# Patient Record
Sex: Female | Born: 1935 | Race: White | Hispanic: No | Marital: Married | Smoking: Never smoker
Health system: Southern US, Community
[De-identification: ages and names within clinical notes are randomized; demographics above are authoritative.]

## PROBLEM LIST (undated history)

## (undated) DIAGNOSIS — N390 Urinary tract infection, site not specified: Secondary | ICD-10-CM

## (undated) DIAGNOSIS — F32A Depression, unspecified: Secondary | ICD-10-CM

## (undated) DIAGNOSIS — F419 Anxiety disorder, unspecified: Secondary | ICD-10-CM

## (undated) DIAGNOSIS — E785 Hyperlipidemia, unspecified: Secondary | ICD-10-CM

## (undated) DIAGNOSIS — F329 Major depressive disorder, single episode, unspecified: Secondary | ICD-10-CM

## (undated) DIAGNOSIS — I10 Essential (primary) hypertension: Secondary | ICD-10-CM

## (undated) DIAGNOSIS — N189 Chronic kidney disease, unspecified: Secondary | ICD-10-CM

## (undated) DIAGNOSIS — T7840XA Allergy, unspecified, initial encounter: Secondary | ICD-10-CM

## (undated) DIAGNOSIS — M1990 Unspecified osteoarthritis, unspecified site: Secondary | ICD-10-CM

## (undated) HISTORY — DX: Depression, unspecified: F32.A

## (undated) HISTORY — DX: Hyperlipidemia, unspecified: E78.5

## (undated) HISTORY — DX: Essential (primary) hypertension: I10

## (undated) HISTORY — DX: Anxiety disorder, unspecified: F41.9

## (undated) HISTORY — DX: Major depressive disorder, single episode, unspecified: F32.9

## (undated) HISTORY — DX: Urinary tract infection, site not specified: N39.0

## (undated) HISTORY — DX: Allergy, unspecified, initial encounter: T78.40XA

## (undated) HISTORY — PX: INCONTINENCE SURGERY: SHX676

## (undated) HISTORY — PX: ABDOMINAL HYSTERECTOMY: SHX81

## (undated) HISTORY — PX: OTHER SURGICAL HISTORY: SHX169

---

## 2001-02-27 ENCOUNTER — Other Ambulatory Visit: Admission: RE | Admit: 2001-02-27 | Discharge: 2001-02-27 | Payer: Self-pay | Admitting: Obstetrics and Gynecology

## 2007-01-19 ENCOUNTER — Emergency Department (HOSPITAL_COMMUNITY): Admission: EM | Admit: 2007-01-19 | Discharge: 2007-01-19 | Payer: Self-pay | Admitting: Emergency Medicine

## 2007-01-23 ENCOUNTER — Emergency Department (HOSPITAL_COMMUNITY): Admission: EM | Admit: 2007-01-23 | Discharge: 2007-01-23 | Payer: Self-pay | Admitting: Emergency Medicine

## 2007-04-09 ENCOUNTER — Encounter: Admission: RE | Admit: 2007-04-09 | Discharge: 2007-04-09 | Payer: Self-pay | Admitting: Orthopedic Surgery

## 2007-05-15 ENCOUNTER — Other Ambulatory Visit: Admission: RE | Admit: 2007-05-15 | Discharge: 2007-05-15 | Payer: Self-pay | Admitting: Otolaryngology

## 2007-12-04 ENCOUNTER — Encounter: Payer: Self-pay | Admitting: Cardiovascular Disease

## 2009-06-04 ENCOUNTER — Inpatient Hospital Stay (HOSPITAL_COMMUNITY)
Admission: EM | Admit: 2009-06-04 | Discharge: 2009-06-05 | Payer: Self-pay | Source: Home / Self Care | Admitting: Emergency Medicine

## 2009-06-04 ENCOUNTER — Ambulatory Visit: Payer: Self-pay | Admitting: Family Medicine

## 2009-06-17 ENCOUNTER — Ambulatory Visit: Payer: Self-pay | Admitting: Family Medicine

## 2009-06-17 DIAGNOSIS — F4310 Post-traumatic stress disorder, unspecified: Secondary | ICD-10-CM | POA: Insufficient documentation

## 2009-06-17 DIAGNOSIS — M810 Age-related osteoporosis without current pathological fracture: Secondary | ICD-10-CM | POA: Insufficient documentation

## 2009-06-17 DIAGNOSIS — E785 Hyperlipidemia, unspecified: Secondary | ICD-10-CM | POA: Insufficient documentation

## 2009-06-17 DIAGNOSIS — M159 Polyosteoarthritis, unspecified: Secondary | ICD-10-CM | POA: Insufficient documentation

## 2009-06-17 DIAGNOSIS — I10 Essential (primary) hypertension: Secondary | ICD-10-CM | POA: Insufficient documentation

## 2009-06-18 ENCOUNTER — Telehealth: Payer: Self-pay | Admitting: Family Medicine

## 2009-10-13 ENCOUNTER — Encounter: Payer: Self-pay | Admitting: Family Medicine

## 2010-01-21 ENCOUNTER — Ambulatory Visit: Payer: Self-pay | Admitting: Family Medicine

## 2010-01-21 ENCOUNTER — Encounter: Payer: Self-pay | Admitting: Family Medicine

## 2010-01-21 LAB — CONVERTED CEMR LAB
BUN: 28 mg/dL — ABNORMAL HIGH (ref 6–23)
CO2: 26 meq/L (ref 19–32)
Calcium: 9.4 mg/dL (ref 8.4–10.5)
Chloride: 101 meq/L (ref 96–112)
Creatinine, Ser: 1.22 mg/dL — ABNORMAL HIGH (ref 0.40–1.20)
Glucose, Bld: 108 mg/dL — ABNORMAL HIGH (ref 70–99)
HCT: 42.4 % (ref 36.0–46.0)
Hemoglobin: 14.3 g/dL
Hemoglobin: 14.1 g/dL (ref 12.0–15.0)
MCHC: 33.3 g/dL (ref 30.0–36.0)
MCV: 91.6 fL (ref 78.0–100.0)
Platelets: 207 10*3/uL (ref 150–400)
Potassium: 3.6 meq/L (ref 3.5–5.3)
RBC: 4.63 M/uL (ref 3.87–5.11)
RDW: 12.9 % (ref 11.5–15.5)
Sodium: 138 meq/L (ref 135–145)
TSH: 2.068 microintl units/mL (ref 0.350–4.500)
WBC: 7 10*3/uL (ref 4.0–10.5)

## 2010-01-29 ENCOUNTER — Ambulatory Visit: Payer: Self-pay | Admitting: Family Medicine

## 2010-04-11 ENCOUNTER — Telehealth: Payer: Self-pay | Admitting: Family Medicine

## 2010-04-13 ENCOUNTER — Ambulatory Visit: Payer: Self-pay | Admitting: Cardiovascular Disease

## 2010-05-16 ENCOUNTER — Emergency Department (HOSPITAL_COMMUNITY)
Admission: EM | Admit: 2010-05-16 | Discharge: 2010-05-16 | Payer: Self-pay | Source: Home / Self Care | Admitting: Emergency Medicine

## 2010-05-17 LAB — CBC
HCT: 39.7 % (ref 36.0–46.0)
Hemoglobin: 13 g/dL (ref 12.0–15.0)
MCH: 29.5 pg (ref 26.0–34.0)
MCHC: 32.7 g/dL (ref 30.0–36.0)
MCV: 90 fL (ref 78.0–100.0)
RBC: 4.41 MIL/uL (ref 3.87–5.11)
RDW: 12.8 % (ref 11.5–15.5)
WBC: 7.7 10*3/uL (ref 4.0–10.5)

## 2010-05-17 LAB — URINALYSIS, ROUTINE W REFLEX MICROSCOPIC
Bilirubin Urine: NEGATIVE
Hgb urine dipstick: NEGATIVE
Ketones, ur: NEGATIVE mg/dL
Nitrite: NEGATIVE
Protein, ur: NEGATIVE mg/dL
Specific Gravity, Urine: 1.018 (ref 1.005–1.030)
Urine Glucose, Fasting: NEGATIVE mg/dL
Urobilinogen, UA: 1 mg/dL (ref 0.0–1.0)
pH: 6 (ref 5.0–8.0)

## 2010-05-17 LAB — BASIC METABOLIC PANEL
BUN: 31 mg/dL — ABNORMAL HIGH (ref 6–23)
CO2: 22 mEq/L (ref 19–32)
Chloride: 106 mEq/L (ref 96–112)
GFR calc Af Amer: 45 mL/min — ABNORMAL LOW (ref >60)
Glucose, Bld: 125 mg/dL — ABNORMAL HIGH (ref 70–99)
Potassium: 3.4 mEq/L — ABNORMAL LOW (ref 3.5–5.1)
Sodium: 138 mEq/L (ref 135–145)

## 2010-05-17 LAB — URINE MICROSCOPIC-ADD ON

## 2010-05-19 LAB — URINE CULTURE
Colony Count: 50000
Culture  Setup Time: 201201152108

## 2010-05-27 ENCOUNTER — Ambulatory Visit
Admission: RE | Admit: 2010-05-27 | Discharge: 2010-05-27 | Payer: Self-pay | Source: Home / Self Care | Attending: Family Medicine | Admitting: Family Medicine

## 2010-05-27 ENCOUNTER — Encounter: Payer: Self-pay | Admitting: Family Medicine

## 2010-05-27 DIAGNOSIS — R300 Dysuria: Secondary | ICD-10-CM | POA: Insufficient documentation

## 2010-05-27 DIAGNOSIS — R5383 Other fatigue: Secondary | ICD-10-CM | POA: Insufficient documentation

## 2010-05-27 DIAGNOSIS — N819 Female genital prolapse, unspecified: Secondary | ICD-10-CM | POA: Insufficient documentation

## 2010-05-27 DIAGNOSIS — R5381 Other malaise: Secondary | ICD-10-CM | POA: Insufficient documentation

## 2010-05-27 LAB — CONVERTED CEMR LAB
Bilirubin Urine: NEGATIVE
Blood in Urine, dipstick: NEGATIVE
Glucose, Urine, Semiquant: NEGATIVE
Nitrite: NEGATIVE
Protein, U semiquant: NEGATIVE
Specific Gravity, Urine: 1.015
Urobilinogen, UA: 0.2
WBC Urine, dipstick: NEGATIVE
pH: 6

## 2010-05-28 LAB — CONVERTED CEMR LAB
ALT: 18 units/L (ref 0–35)
AST: 24 units/L (ref 0–37)
Albumin: 4.3 g/dL (ref 3.5–5.2)
Alkaline Phosphatase: 82 units/L (ref 39–117)
BUN: 28 mg/dL — ABNORMAL HIGH (ref 6–23)
Basophils Absolute: 0 10*3/uL (ref 0.0–0.1)
Basophils Relative: 0 % (ref 0–1)
CO2: 29 meq/L (ref 19–32)
Calcium: 9.9 mg/dL (ref 8.4–10.5)
Chloride: 104 meq/L (ref 96–112)
Creatinine, Ser: 1.43 mg/dL — ABNORMAL HIGH (ref 0.40–1.20)
Eosinophils Absolute: 0.1 10*3/uL (ref 0.0–0.7)
Eosinophils Relative: 2 % (ref 0–5)
Glucose, Bld: 104 mg/dL — ABNORMAL HIGH (ref 70–99)
HCT: 41.3 % (ref 36.0–46.0)
Hemoglobin: 13.4 g/dL (ref 12.0–15.0)
Lymphocytes Relative: 26 % (ref 12–46)
Lymphs Abs: 1.7 10*3/uL (ref 0.7–4.0)
MCHC: 32.4 g/dL (ref 30.0–36.0)
MCV: 92.2 fL (ref 78.0–100.0)
Monocytes Absolute: 0.6 10*3/uL (ref 0.1–1.0)
Monocytes Relative: 9 % (ref 3–12)
Neutro Abs: 4 10*3/uL (ref 1.7–7.7)
Neutrophils Relative %: 63 % (ref 43–77)
Platelets: 192 10*3/uL (ref 150–400)
Potassium: 3.6 meq/L (ref 3.5–5.3)
RBC: 4.48 M/uL (ref 3.87–5.11)
RDW: 13.4 % (ref 11.5–15.5)
Sodium: 141 meq/L (ref 135–145)
Total Bilirubin: 0.8 mg/dL (ref 0.3–1.2)
Total Protein: 6.9 g/dL (ref 6.0–8.3)
WBC: 6.4 10*3/uL (ref 4.0–10.5)

## 2010-06-01 NOTE — Assessment & Plan Note (Signed)
Summary: f/u eo   Vital Signs:  Patient profile:   77 year old female Weight:      160 pounds Temp:     98.4 degrees F oral Pulse rate:   55 / minute BP sitting:   145 / 73  (right arm) Cuff size:   regular  Vitals Entered By: Tessie Fass CMA (January 29, 2010 1:44 PM) CC: f/u black stools Is Patient Diabetic? No Pain Assessment Patient in pain? no        Primary Care Provider:  Helane Rima DO  CC:  f/u black stools.  History of Present Illness: 77 yo F:  1. Follow-Up Black Stools: See previous OV for more details. Patient states that she has had no more episodes of black stools since her last visit. BM now daily, normal. Patient denies abdominal pain at the last visit, but states that she feels much better since starting the Protonix (prescribed initally for suspected gastric irritation with possible GI bleed) - "I can actually sleep now." No CP, SOB, N/V/D/C, LE edema, rash, abdominal pain.  Habits & Providers  Alcohol-Tobacco-Diet     Tobacco Status: never  Current Medications (verified): 1)  Zoloft 100 Mg Tabs (Sertraline Hcl) .... 1/2 By Mouth Daily 2)  Metoprolol Tartrate 50 Mg Tabs (Metoprolol Tartrate) .... Take 1 Tab By Mouth Daily 3)  Potassium Chloride Cr 10 Meq  Tbcr (Potassium Chloride) .... 2 By Mouth Daily 4)  Hydrochlorothiazide 12.5 Mg  Tabs (Hydrochlorothiazide) .... Take 1 Tab  By Mouth Every Morning 5)  Epipen 2-Pak 0.3 Mg/0.65ml Devi (Epinephrine) .... Use As Directed 6)  Simvastatin 80 Mg Tabs (Simvastatin) .Marland Kitchen.. 1 By Mouth At Bedtime 7)  Protonix 40 Mg Tbec (Pantoprazole Sodium) .... One By Mouth Daily  Allergies (verified): 1)  ! * Lisinopril 2)  ! * Carvedilol 3)  ! Doxazosin PMH-FH-SH reviewed for relevance  Review of Systems      See HPI  Physical Exam  General:  Elderly female, in no acute distress; alert, appropriate and cooperative throughout examination. Vitals reviewed. Abdomen:  Bowel sounds positive, abdomen soft and  non-tender without masses, organomegaly or hernias noted.   Impression & Recommendations:  Problem # 1:  FECES, ABNORMAL (ICD-787.7) Assessment Improved Likely 2/2 Pepto Bismol use. Red Flags given for seeking medical care. Hgb WNL. Orders: [facility]- Est Level  3 ([id number])  Problem # 2:  DYSPEPSIA (ICD-536.8) Assessment: Improved Patient was vague at last visit re: s/s. Now that symptoms improved with Protonix, will continue until our next visit. Bahavior modifications discussed. Orders: [facility]- Est Level  3 ([id number])  Complete Medication List: 1)  Zoloft 100 Mg Tabs (Sertraline hcl) .... 1/2 by mouth daily 2)  Metoprolol Tartrate 50 Mg Tabs (Metoprolol tartrate) .... Take 1 tab by mouth daily 3)  Potassium Chloride Cr 10 Meq Tbcr (Potassium chloride) .... 2 by mouth daily 4)  Hydrochlorothiazide 12.5 Mg Tabs (Hydrochlorothiazide) .... Take 1 tab  by mouth every morning 5)  Epipen 2-pak 0.3 Mg/0.31ml Devi (Epinephrine) .... Use as directed 6)  Simvastatin 80 Mg Tabs (Simvastatin) .Marland Kitchen.. 1 by mouth at bedtime 7)  Protonix 40 Mg Tbec (Pantoprazole sodium) .... One by mouth daily  Patient Instructions: 1)  It was nice to see you today! 2)  I'm glad that you are feeling better! 3)  Follow up in (at the most) 1-2 months so that we can discuss your chronic medical conditions. Prescriptions: ZOLOFT 100 MG TABS (SERTRALINE HCL) 1/2 by mouth daily  #30 x  3   Entered and Authorized by:   Helane Rima DO   Signed by:   Helane Rima DO on 01/29/2010   Method used:   Print then Give to Patient   RxID:   [id number] PROTONIX 40 MG TBEC (PANTOPRAZOLE SODIUM) one by mouth daily  #30 x 3   Entered and Authorized by:   Helane Rima DO   Signed by:   Helane Rima DO on 01/29/2010   Method used:   Print then Give to Patient   RxID:   [id number]   Prevention & Chronic Care Immunizations   Influenza vaccine: Not documented   Influenza vaccine deferral: Not indicated  (06/17/2009)   Influenza  vaccine due: 01/01/2011    Tetanus booster: Not documented    Pneumococcal vaccine: Not documented   Pneumococcal vaccine deferral: Not indicated  (06/17/2009)    H. zoster vaccine: Not documented  Colorectal Screening   Hemoccult: Not documented   Hemoccult action/deferral: Not indicated  (01/21/2010)    Colonoscopy: Not documented   Colonoscopy action/deferral: Refused  (01/29/2010)  Other Screening   Pap smear: Not documented   Pap smear action/deferral: Not indicated-other  (01/21/2010)    Mammogram: Not documented    DXA bone density scan: Not documented   DXA bone density action/deferral: Refused  (01/21/2010)   Smoking status: never  (01/29/2010)  Lipids   Total Cholesterol: Not documented   LDL: Not documented   LDL Direct: Not documented   HDL: Not documented   Triglycerides: Not documented    SGOT (AST): Not documented   SGPT (ALT): Not documented   Alkaline phosphatase: Not documented   Total bilirubin: Not documented    Lipid flowsheet reviewed?: Yes   Progress toward LDL goal: Unchanged  Hypertension   Last Blood Pressure: 145 / 73  (01/29/2010)   Serum creatinine: 1.22  (01/21/2010)   Serum potassium 3.6  (01/21/2010)    Hypertension flowsheet reviewed?: Yes   Progress toward BP goal: At goal  Self-Management Support :   Personal Goals (by the next clinic visit) :      Personal blood pressure goal: 140/90  (06/17/2009)     Personal LDL goal: 130  (06/17/2009)    Patient will work on the following items until the next clinic visit to reach self-care goals:     Medications and monitoring: take my medicines every day, bring all of my medications to every visit  (01/29/2010)     Eating: drink diet soda or water instead of juice or soda, eat more vegetables, use fresh or frozen vegetables, eat foods that are low in salt, eat baked foods instead of fried foods, eat fruit for snacks and desserts, limit or avoid alcohol  (01/29/2010)     Activity:  take a 30 minute walk every day, take the stairs instead of the elevator, park at the far end of the parking lot  (01/29/2010)    Hypertension self-management support: Written self-care plan  (01/29/2010)   Hypertension self-care plan printed.    Lipid self-management support: Written self-care plan  (01/29/2010)   Lipid self-care plan printed.

## 2010-06-01 NOTE — Assessment & Plan Note (Signed)
Summary: black stool,tcb   Vital Signs:  Patient profile:   77 year old female Weight:      161 pounds Temp:     97.9 degrees F oral Pulse rate:   56 / minute BP sitting:   162 / 89  (right arm) Cuff size:   regular  Vitals Entered By: Jimmy Footman, CMA (January 21, 2010 10:25 AM) CC: black stools x6 days, vomiting, fever, diarrhea Is Patient Diabetic? No Pain Assessment Patient in pain? yes     Location: stomach Intensity: 10 Type: sharp   Primary Care Provider:  Helane Rima DO  CC:  black stools x6 days, vomiting, fever, and diarrhea.  History of Present Illness: 77 yo F:  1. Black Stools: x 6 days, was loose but took Immodium and is now hard, was associated with abdominal pain - "I felt like I was in labor the first day," + nausea, + NBNB vomiting x 1. None after D#1. Decreased appetitie, but has been eating chicken noodle soup. Denies CP, SOB, HA, dizziness, LE edema, rash, sick contacts, change in diet, change in weight, night sweats, adbominal pain, and diarrhea now.   25 minutes spent with patient to discuss HPI as she was vague with her symptoms and tended to change subjects.  Habits & Providers  Alcohol-Tobacco-Diet     Tobacco Status: never  Current Medications (verified): 1)  Zoloft 100 Mg Tabs (Sertraline Hcl) .... 1/2 By Mouth Daily 2)  Metoprolol Tartrate 50 Mg Tabs (Metoprolol Tartrate) .... Take 1 Tab By Mouth Daily 3)  Potassium Chloride Cr 10 Meq  Tbcr (Potassium Chloride) .... 2 By Mouth Daily 4)  Hydrochlorothiazide 12.5 Mg  Tabs (Hydrochlorothiazide) .... Take 1 Tab  By Mouth Every Morning 5)  Epipen 2-Pak 0.3 Mg/0.42ml Devi (Epinephrine) .... Use As Directed 6)  Simvastatin 80 Mg Tabs (Simvastatin) .Marland Kitchen.. 1 By Mouth At Bedtime 7)  Protonix 40 Mg Tbec (Pantoprazole Sodium) .... One By Mouth Daily  Allergies (verified): 1)  ! * Lisinopril 2)  ! * Carvedilol 3)  ! Doxazosin PMH-FH-SH reviewed for relevance  Review of Systems      See  HPI  Physical Exam  General:  Elderly female, in no acute distress; alert, appropriate and cooperative throughout examination. Vitals reviewed. Lungs:  Normal respiratory effort, chest expands symmetrically. Lungs are clear to auscultation, no crackles or wheezes. Heart:  Normal rate and regular rhythm. S1 and S2 normal without gallop, murmur, click, rub or other extra sounds. Abdomen:  Bowel sounds positive, abdomen soft and non-tender without masses, organomegaly or hernias noted. Rectal:  No external abnormalities noted. Normal sphincter tone. No rectal masses or tenderness. Negative FOB. Pulses:  R and L dorsalis pedis and posterior tibial pulses are full and equal bilaterally. Skin:  Intact without suspicious lesions or rashes.   Impression & Recommendations:  Problem # 1:  FECES, ABNORMAL (ICD-787.7) Assessment New  Suspicious for melena, however negative FOB. Resolved now. Upon further questioning, patient endorses taking Pepto-Bismol as needed for diarrhea/abdominal discomfort a few days ago. Explained that this may be the cause of her black stools. Red Flags given. Will check CBC for anemia. Rx Protonix. If continues, will send to GI for EGD.  Orders: [facility]- Est  Level 4 ([id number])  Complete Medication List: 1)  Zoloft 100 Mg Tabs (Sertraline hcl) .... 1/2 by mouth daily 2)  Metoprolol Tartrate 50 Mg Tabs (Metoprolol tartrate) .... Take 1 tab by mouth daily 3)  Potassium Chloride Cr 10 Meq Tbcr (Potassium  chloride) .... 2 by mouth daily 4)  Hydrochlorothiazide 12.5 Mg Tabs (Hydrochlorothiazide) .... Take 1 tab  by mouth every morning 5)  Epipen 2-pak 0.3 Mg/0.86ml Devi (Epinephrine) .... Use as directed 6)  Simvastatin 80 Mg Tabs (Simvastatin) .Marland Kitchen.. 1 by mouth at bedtime 7)  Protonix 40 Mg Tbec (Pantoprazole sodium) .... One by mouth daily  Other Orders: Basic Met-[facility] [phone]) TSH-[facility] [phone]) Hemoglobin-[facility] [phone]) CBC-[facility] ([id number])  Patient Instructions: 1)  It was  nice to see you today! 2)  Follow up in 1 week. We need to discuss your other medical issues. 3)  You are due for a mammogram. 4)  We are checking labs. Prescriptions: PROTONIX 40 MG TBEC (PANTOPRAZOLE SODIUM) one by mouth daily  #30 x 3   Entered and Authorized by:   Helane Rima DO   Signed by:   Helane Rima DO on 01/21/2010   Method used:   Print then Give to Patient   RxID:   [id number]   Laboratory Results   Blood Tests   Date/Time Received: January 21, 2010 11:11 AM  Date/Time Reported: January 21, 2010 11:21 AM     CBC   HGB:  14.3 g/dL   (Normal Range: 44.0-10.2 in Males, 12.0-15.0 in Females) Comments: venous sample ...............test performed by......Marland KitchenBonnie A. [place], MLS (ASCP)cm     Prevention & Chronic Care Immunizations   Influenza vaccine: Not documented   Influenza vaccine deferral: Not indicated  (06/17/2009)   Influenza vaccine due: 01/01/2011    Tetanus booster: Not documented    Pneumococcal vaccine: Not documented   Pneumococcal vaccine deferral: Not indicated  (06/17/2009)    H. zoster vaccine: Not documented  Colorectal Screening   Hemoccult: Not documented   Hemoccult action/deferral: Not indicated  (01/21/2010)    Colonoscopy: Not documented  Other Screening   Pap smear: Not documented   Pap smear action/deferral: Not indicated-other  (01/21/2010)    Mammogram: Not documented    DXA bone density scan: Not documented   DXA bone density action/deferral: Refused  (01/21/2010)   Smoking status: never  (01/21/2010)  Lipids   Total Cholesterol: Not documented   LDL: Not documented   LDL Direct: Not documented   HDL: Not documented   Triglycerides: Not documented    SGOT (AST): Not documented   SGPT (ALT): Not documented   Alkaline phosphatase: Not documented   Total bilirubin: Not documented    Lipid flowsheet reviewed?: Yes   Progress toward LDL goal: Unchanged  Hypertension   Last Blood Pressure: 162 /  89  (01/21/2010)   Serum creatinine: Not documented   Serum potassium Not documented    Hypertension flowsheet reviewed?: Yes   Progress toward BP goal: Unchanged  Self-Management Support :   Personal Goals (by the next clinic visit) :      Personal blood pressure goal: 140/90  (06/17/2009)     Personal LDL goal: 130  (06/17/2009)    Patient will work on the following items until the next clinic visit to reach self-care goals:     Medications and monitoring: take my medicines every day, bring all of my medications to every visit  (01/21/2010)     Eating: drink diet soda or water instead of juice or soda, eat more vegetables, use fresh or frozen vegetables, eat foods that are low in salt, eat baked foods instead of fried foods, eat fruit for snacks and desserts, limit or avoid alcohol  (01/21/2010)     Activity: take a 30 minute walk every  day  (01/21/2010)    Hypertension self-management support: Written self-care plan  (01/21/2010)   Hypertension self-care plan printed.    Lipid self-management support: Written self-care plan  (01/21/2010)   Lipid self-care plan printed.

## 2010-06-01 NOTE — Assessment & Plan Note (Signed)
Summary: h/fup,tcb   Vital Signs:  Patient profile:   77 year old female Weight:      162 pounds Temp:     98.0 degrees F oral Pulse rate:   93 / minute BP sitting:   148 / 82  (left arm) Cuff size:   regular  Vitals Entered By: Tessie Fass CMA, (June 17, 2009 3:35 PM) CC: Hospital Follow-up/New Patient Is Patient Diabetic? No Pain Assessment Patient in pain? no        Primary Care Provider:  Helane Rima DO  CC:  Hospital Follow-up/New Patient.  History of Present Illness: 77 yo F with recent hopsital admission for anaphylaxis, thought to be 2/2 to ACE and worsened by Coreg.  1. HTN: Now on Rx Metoprolol, HCTZ, KDur. Controlled. Patient goes to [facility] and has an appointment on the 22nd.   2. HLD: Rx Simvastatin.  3. PTSD: We spent >15 minutes discussing the patient's anxiety/depression associated with her recent experience. She has periods of cyring uncontrollably and feeling angry toward her husband since her hospitalization. She states that he should have put a blanket on her when she was naked in the Emergency Room. Her adult children have been staying with them since coming home. They have been very helpful around the house and she has been able to talk to them as well as her husband about her feelings. She requests Alprazolam today, stating that she has been prescribed this before and it has helped.    Habits & Providers  Alcohol-Tobacco-Diet     Tobacco Status: never  Current Medications (verified): 1)  Zoloft 100 Mg Tabs (Sertraline Hcl) .... 1/2 By Mouth Daily 2)  Metoprolol Tartrate 50 Mg Tabs (Metoprolol Tartrate) .... Take 1 Tab By Mouth Daily 3)  Potassium Chloride 20 Meq  Pack (Potassium Chloride) .... One By Mouth Daily 4)  Hydrochlorothiazide 12.5 Mg  Tabs (Hydrochlorothiazide) .... Take 1 Tab  By Mouth Every Morning 5)  Epipen 2-Pak 0.3 Mg/0.28ml Devi (Epinephrine) .... Use As Directed 6)  Simvastatin 80 Mg Tabs (Simvastatin) .Marland Kitchen.. 1 By  Mouth At Bedtime  Allergies (verified): 1)  ! * Lisinopril 2)  ! * Carvedilol 3)  ! Doxazosin  Past History:  Past Medical History: OSTEOARTHRITIS, MULTIPLE JOINTS (ICD-715.89) OSTEOPOROSIS (ICD-733.00) PTSD (ICD-309.81) HYPERLIPIDEMIA (ICD-272.4) ESSENTIAL HYPERTENSION (ICD-401.9) ANAPHYLACTIC REACTION (ICD-995.0) PUD, HX OF (ICD-V12.71)  Past Surgical History: Hysterectomy (1968) Bladder Sling + Flu Vaccine (Nov 2010)  Family History: Mother: DM Sister: Breast Cancer, age 39  Social History: Lives in [place] with her husband Yazaira Speas) and step-son. She is retired. Denies tobacco, ETOH, drugs.Smoking Status:  never  Review of Systems       The patient complains of depression.  The patient denies fever, chest pain, syncope, dyspnea on exertion, peripheral edema, prolonged cough, headaches, severe indigestion/heartburn, muscle weakness, and difficulty walking.    Physical Exam  General:  Elderly female, in no acute distress; alert, appropriate and cooperative throughout examination. Vitals reviewed. Neck:  No deformities, masses, or tenderness noted. Lungs:  Normal respiratory effort, chest expands symmetrically. Lungs are clear to auscultation, no crackles or wheezes. Heart:  Normal rate and regular rhythm. S1 and S2 normal without gallop, murmur, click, rub or other extra sounds. Psych:  Oriented X3, memory intact for recent and remote, normally interactive, and good eye contact.     Impression & Recommendations:  Problem # 1:  ANAPHYLACTIC REACTION (ICD-995.0) Assessment New Hx of Anaphylactic Rxn thought to be 2/2 ACE plus Coreg. No  episodes since leaving the hospital. Patient now has Epi-pen and understands how to use it.  Problem # 2:  ESSENTIAL HYPERTENSION (ICD-401.9) Assessment: Unchanged  Her updated medication list for this problem includes:    Metoprolol Tartrate 50 Mg Tabs (Metoprolol tartrate) .Marland Kitchen... Take 1 tab by mouth daily    Hydrochlorothiazide  12.5 Mg Tabs (Hydrochlorothiazide) .Marland Kitchen... Take 1 tab  by mouth every morning  Problem # 3:  HYPERLIPIDEMIA (ICD-272.4) Assessment: Unchanged  Her updated medication list for this problem includes:    Simvastatin 80 Mg Tabs (Simvastatin) .Marland Kitchen... 1 by mouth at bedtime  Problem # 4:  PTSD (ICD-309.81) Assessment: New Rx Zoloft. No SI/HI. Patient has family support and talks about the experience with them. Denies request for Xanax as this medication is too risky for her (sedation, fall, etc.). Patient understands and agreed.   Complete Medication List: 1)  Zoloft 100 Mg Tabs (Sertraline hcl) .... 1/2 by mouth daily 2)  Metoprolol Tartrate 50 Mg Tabs (Metoprolol tartrate) .... Take 1 tab by mouth daily 3)  Potassium Chloride 20 Meq Pack (Potassium chloride) .... One by mouth daily 4)  Hydrochlorothiazide 12.5 Mg Tabs (Hydrochlorothiazide) .... Take 1 tab  by mouth every morning 5)  Epipen 2-pak 0.3 Mg/0.67ml Devi (Epinephrine) .... Use as directed 6)  Simvastatin 80 Mg Tabs (Simvastatin) .Marland Kitchen.. 1 by mouth at bedtime  Patient Instructions: 1)  It was nice to see you today! 2)  Follow up in 1 month. Prescriptions: SIMVASTATIN 80 MG TABS (SIMVASTATIN) 1 by mouth at bedtime  #90 x 3   Entered and Authorized by:   Helane Rima DO   Signed by:   Helane Rima DO on 06/20/2009   Method used:   Historical   RxID:   [id number] HYDROCHLOROTHIAZIDE 12.5 MG  TABS (HYDROCHLOROTHIAZIDE) Take 1 tab  by mouth every morning  #90 x 3   Entered and Authorized by:   Helane Rima DO   Signed by:   Helane Rima DO on 06/17/2009   Method used:   Print then Give to Patient   RxID:   [id number] POTASSIUM CHLORIDE 20 MEQ  PACK (POTASSIUM CHLORIDE) one by mouth daily  #90 x 3   Entered and Authorized by:   Helane Rima DO   Signed by:   Helane Rima DO on 06/17/2009   Method used:   Print then Give to Patient   RxID:   [id number] METOPROLOL TARTRATE 50 MG TABS (METOPROLOL TARTRATE) Take 1 tab  by mouth daily  #90 x 3   Entered and Authorized by:   Helane Rima DO   Signed by:   Helane Rima DO on 06/17/2009   Method used:   Print then Give to Patient   RxID:   [id number] ZOLOFT 100 MG TABS (SERTRALINE HCL) 1/2 by mouth daily  #30 x 0   Entered and Authorized by:   Helane Rima DO   Signed by:   Helane Rima DO on 06/17/2009   Method used:   Print then Give to Patient   RxID:   [id number]   Prevention & Chronic Care Immunizations   Influenza vaccine: Not documented   Influenza vaccine deferral: Not indicated  (06/17/2009)   Influenza vaccine due: 01/01/2011    Tetanus booster: Not documented    Pneumococcal vaccine: Not documented   Pneumococcal vaccine deferral: Not indicated  (06/17/2009)    H. zoster vaccine: Not documented  Colorectal Screening   Hemoccult: Not documented    Colonoscopy: Not documented  Other Screening  Pap smear: Not documented    Mammogram: Not documented    DXA bone density scan: Not documented   Smoking status: never  (06/17/2009)  Lipids   Total Cholesterol: Not documented   LDL: Not documented   LDL Direct: Not documented   HDL: Not documented   Triglycerides: Not documented    SGOT (AST): Not documented   SGPT (ALT): Not documented   Alkaline phosphatase: Not documented   Total bilirubin: Not documented    Lipid flowsheet reviewed?: Yes   Progress toward LDL goal: Unchanged   Lipid comments: Last checked at [facility] (05/2009)  Hypertension   Last Blood Pressure: 148 / 82  (06/17/2009)   Serum creatinine: Not documented   Serum potassium Not documented    Hypertension flowsheet reviewed?: Yes   Progress toward BP goal: Unchanged  Self-Management Support :   Personal Goals (by the next clinic visit) :      Personal blood pressure goal: 140/90  (06/17/2009)     Personal LDL goal: 130  (06/17/2009)    Patient will work on the following items until the next clinic visit to reach self-care  goals:     Medications and monitoring: take my medicines every day  (06/17/2009)     Eating: drink diet soda or water instead of juice or soda, eat more vegetables, use fresh or frozen vegetables, eat foods that are low in salt, eat baked foods instead of fried foods, eat fruit for snacks and desserts  (06/17/2009)     Activity: take a 30 minute walk every day  (06/17/2009)    Hypertension self-management support: Written self-care plan  (06/17/2009)   Hypertension self-care plan printed.    Lipid self-management support: Written self-care plan  (06/17/2009)   Lipid self-care plan printed.

## 2010-06-01 NOTE — Progress Notes (Signed)
Summary: meds prob  Phone Note Call from Patient Call back at Home Phone [phone]   Caller: Patient Summary of Call: pt states that ins won't pay for potassium powder but will pay for pill form [facility] [place] Initial call taken by: De Nurse,  June 18, 2009 2:05 PM    New/Updated Medications: POTASSIUM CHLORIDE CR 10 MEQ  TBCR (POTASSIUM CHLORIDE) 2 by mouth daily Prescriptions: POTASSIUM CHLORIDE CR 10 MEQ  TBCR (POTASSIUM CHLORIDE) 2 by mouth daily  #60 x 3   Entered and Authorized by:   Helane Rima DO   Signed by:   Helane Rima DO on 06/20/2009   Method used:   Electronically to        [facility] [phone]* (retail)       [address]       [place], [place]  [address]       Ph: [phone] or [phone]       Fax: [phone]   RxID:   [id number]

## 2010-06-03 NOTE — Progress Notes (Signed)
Summary: UPDATE PROBLEM LIST   

## 2010-06-04 ENCOUNTER — Encounter: Payer: Self-pay | Admitting: Family Medicine

## 2010-06-04 ENCOUNTER — Ambulatory Visit (INDEPENDENT_AMBULATORY_CARE_PROVIDER_SITE_OTHER): Payer: MEDICARE | Admitting: Family Medicine

## 2010-06-04 DIAGNOSIS — R5383 Other fatigue: Secondary | ICD-10-CM

## 2010-06-04 DIAGNOSIS — N179 Acute kidney failure, unspecified: Secondary | ICD-10-CM

## 2010-06-04 DIAGNOSIS — R5381 Other malaise: Secondary | ICD-10-CM

## 2010-06-04 DIAGNOSIS — N183 Chronic kidney disease, stage 3 unspecified: Secondary | ICD-10-CM | POA: Insufficient documentation

## 2010-06-04 DIAGNOSIS — N819 Female genital prolapse, unspecified: Secondary | ICD-10-CM

## 2010-06-09 NOTE — Consult Note (Signed)
Summary: [facility]  [facility]   Imported By: De Nurse 06/01/2010 15:08:06  _____________________________________________________________________  External Attachment:    Type:   Image     Comment:   External Document

## 2010-06-09 NOTE — Assessment & Plan Note (Signed)
Summary: not feeling well/eo   Vital Signs:  Patient profile:   77 year old female Weight:      162 pounds Temp:     98.1 degrees F oral Pulse rate:   63 / minute Pulse (ortho):   62 / minute BP sitting:   146 / 78  (left arm) BP standing:   140 / 80 Cuff size:   regular  Vitals Entered By: Arlyss Repress CMA, (May 27, 2010 10:54 AM)  Serial Vital Signs/Assessments:  Time      Position  BP       Pulse  Resp  Temp     By 11:59 AM  Lying LA  140/76   60                    Thekla Slade CMA, 11:59 AM  Sitting   140/80   62                    Thekla Slade CMA, 11:59 AM  Standing  140/80   62                    Thekla Slade CMA,  CC: pt states 'I get really hot and I have to lay down. After I rest for a while, I get better. Pt was treated for UTI with Keflex. still c/o problems. seen in ER 05-16-10 Is Patient Diabetic? No Pain Assessment Patient in pain? no        Primary Care Provider:  Helane Rima DO  CC:  pt states 'I get really hot and I have to lay down. After I rest for a while and I get better. Pt was treated for UTI with Keflex. still c/o problems. seen in ER 05-16-10.  History of Present Illness: 77 yo F:  1. "Not feeling well" - ER visit 1/15 - evaluated for weakness, CBC WNL, BMP stable, EKG WNL, UA with LE and WBC so Dx with UTI. She was monitored in ED and sent home with Kelfex. She took all of her Keflex. Today, she says that she just has periods of weakness. She denies fever/chills, N/V/D/C, abdominal pain, LE edema, HA, focal neurological changes, rash, dysuria. She endorses having URI s/s last week and took OTC cold medications. The periods of weakness have happened while already standing, while sitting, and after eating (not orthostatic or hypoglycemic). No new medications or supplements otherwise. No fall.   Habits & Providers  Alcohol-Tobacco-Diet     Tobacco Status: never  Current Medications (verified): 1)  Zoloft 100 Mg Tabs (Sertraline Hcl) .... 1/2  By Mouth Daily 2)  Metoprolol Tartrate 50 Mg Tabs (Metoprolol Tartrate) .... Take 1 Tab By Mouth Daily 3)  Potassium Chloride Cr 10 Meq  Tbcr (Potassium Chloride) .... 2 By Mouth Daily 4)  Hydrochlorothiazide 12.5 Mg  Tabs (Hydrochlorothiazide) .... Take 1 Tab  By Mouth Every Morning 5)  Epipen 2-Pak 0.3 Mg/0.67ml Devi (Epinephrine) .... Use As Directed 6)  Simvastatin 80 Mg Tabs (Simvastatin) .Marland Kitchen.. 1 By Mouth At Bedtime 7)  Protonix 40 Mg Tbec (Pantoprazole Sodium) .... One By Mouth Daily  Allergies (verified): 1)  ! * Lisinopril 2)  ! * Carvedilol 3)  ! Doxazosin PMH-FH-SH reviewed for relevance  Review of Systems      See HPI  Physical Exam  General:  Elderly female, in no acute distress; alert, appropriate and cooperative throughout examination. Vitals reviewed.  Eyes:  No corneal or conjunctival inflammation  noted. EOMI. Perrla. Funduscopic exam benign, without hemorrhages, exudates or papilledema. Vision grossly normal. Ears:  R ear normal and L ear normal.   Nose:  External nasal examination shows no deformity or inflammation. Nasal mucosa are pink and moist without lesions or exudates. Mouth:  Oral mucosa and oropharynx without lesions or exudates.   Neck:  No deformities, masses, or tenderness noted. Lungs:  Normal respiratory effort, chest expands symmetrically. Lungs are clear to auscultation, no crackles or wheezes. Heart:  Normal rate and regular rhythm. S1 and S2 normal without gallop, murmur, click, rub or other extra sounds. No carotid bruit. Abdomen:  Soft, normal bowel sounds, no distention, no masses, no guarding, and no rigidity. Mild TTP epigastric and suprapubic. Genitalia:  Prolapse. Atrophic. Msk:  Passed sit up and go test. Still seems weak, especially at core, when getting on and off exam table. Pulses:  R and L carotid, radial, femoral, dorsalis pedis and posterior tibial pulses are full and equal bilaterally. Extremities:  No edema. Neurologic:  Alert &  oriented X3, cranial nerves II-XII intact, strength normal in all extremities, sensation intact to light touch, and DTRs symmetrical and normal.   Skin:  Intact without suspicious lesions or rashes. Psych:  Oriented X3, memory intact for recent and remote, good eye contact, tearful, and slightly anxious.     Impression & Recommendations:  Problem # 1:  OTHER MALAISE AND FATIGUE (ICD-780.79) Assessment New Unclear etiology. Cannot find red flags today. Will order basic labs and vascular studies. Patient has support at home. She lives with her husband and son. Her son drives her to appointments. She is eating normally, no documented weight loss. She is A&O today. No neuro deficits. No evidence of cardiac issue/fluid overload. Patient does seem to have a depressed mood. If work-up negative, will order PT/OT and further evaluate mood. Orders: CBC w/Diff-[facility] (85025) [facility]- Est  Level 4 (99214) 2 D Echo (2 D Echo) Carotid Doppler (Carotid doppler)  Problem # 2:  UNSPECIFIED GENITAL PROLAPSE (ICD-618.9) Assessment: New Not urgent. Will send to GYN. Orders: [facility]- Est  Level 4 (16109) Gynecologic Referral (Gyn)  Problem # 3:  ESSENTIAL HYPERTENSION (ICD-401.9) Assessment: Unchanged Will hold HCTZ to decrease incontinence and to see if higher BP helps s/s. Negative orthostatics. Patient has cardiologist - Dr. Georgann Housekeeper. No 2DEcho or Carotid Dopplers in several years. Will order. Her updated medication list for this problem includes:    Metoprolol Tartrate 50 Mg Tabs (Metoprolol tartrate) .Marland Kitchen... Take 1 tab by mouth daily    Hydrochlorothiazide 12.5 Mg Tabs (Hydrochlorothiazide) .Marland Kitchen... Take 1 tab  by mouth every morning  Orders: Comp Met-[facility] ([id number]) [facility]- Est  Level 4 (99214) 2 D Echo (2 D Echo) Carotid Doppler (Carotid doppler)  Complete Medication List: 1)  Zoloft 100 Mg Tabs (Sertraline hcl) .... 1/2 by mouth daily 2)  Metoprolol Tartrate 50 Mg Tabs (Metoprolol tartrate) .... Take 1 tab by  mouth daily 3)  Potassium Chloride Cr 10 Meq Tbcr (Potassium chloride) .... 2 by mouth daily 4)  Hydrochlorothiazide 12.5 Mg Tabs (Hydrochlorothiazide) .... Take 1 tab  by mouth every morning 5)  Epipen 2-pak 0.3 Mg/0.56ml Devi (Epinephrine) .... Use as directed 6)  Simvastatin 80 Mg Tabs (Simvastatin) .Marland Kitchen.. 1 by mouth at bedtime 7)  Protonix 40 Mg Tbec (Pantoprazole sodium) .... One by mouth daily  Other Orders: Urinalysis-[facility] (00000)  Patient Instructions: 1)  It was nice to see you today. 2)  You do not have a urinary tract infection now. 3)  Do not take your HCTZ until I see you again. 4)  Do NOT take over-the-counter cold medicines. 5)  Come back next week so that I can re-evaluate you. 6)  We are checking labs today. 7)  We are sending you to a gynecologist. Prescriptions: PROTONIX 40 MG TBEC (PANTOPRAZOLE SODIUM) one by mouth daily  #30 x 3   Entered and Authorized by:   Helane Rima DO   Signed by:   Helane Rima DO on 05/27/2010   Method used:   Print then Give to Patient   RxID:   [id number] POTASSIUM CHLORIDE CR 10 MEQ  TBCR (POTASSIUM CHLORIDE) 2 by mouth daily  #60 x 3   Entered and Authorized by:   Helane Rima DO   Signed by:   Helane Rima DO on 05/27/2010   Method used:   Print then Give to Patient   RxID:   [id number]    Orders Added: 1)  Urinalysis-[facility] [00000] 2)  Comp Met-[facility] [84696-29528] 3)  CBC w/Diff-[facility] [85025] 4)  [facility]- Est  Level 4 [41324] 5)  2 D Echo [2 D Echo] 6)  Carotid Doppler [Carotid doppler] 7)  Gynecologic Referral [Gyn]    Laboratory Results   Urine Tests  Date/Time Received: May 27, 2010 11:03 AM  Date/Time Reported: May 27, 2010 11:37 AM   Routine Urinalysis   Color: yellow Appearance: Clear Glucose: negative   (Normal Range: Negative) Bilirubin: negative   (Normal Range: Negative) Ketone: trace (5)   (Normal Range: Negative) Spec. Gravity: 1.015   (Normal Range: 1.003-1.035) Blood: negative    (Normal Range: Negative) pH: 6.0   (Normal Range: 5.0-8.0) Protein: negative   (Normal Range: Negative) Urobilinogen: 0.2   (Normal Range: 0-1) Nitrite: negative   (Normal Range: Negative) Leukocyte Esterace: negative   (Normal Range: Negative)    Comments: ...............test performed by......Marland KitchenBonnie A. [place], MLS (ASCP)cm

## 2010-06-15 ENCOUNTER — Ambulatory Visit (HOSPITAL_COMMUNITY)
Admission: RE | Admit: 2010-06-15 | Discharge: 2010-06-15 | Disposition: A | Discharge status: Home / Self Care | Payer: Medicare Other | Source: Ambulatory Visit | Attending: Family Medicine | Admitting: Family Medicine

## 2010-06-15 DIAGNOSIS — R55 Syncope and collapse: Secondary | ICD-10-CM | POA: Insufficient documentation

## 2010-06-15 DIAGNOSIS — I517 Cardiomegaly: Secondary | ICD-10-CM

## 2010-06-15 DIAGNOSIS — I10 Essential (primary) hypertension: Secondary | ICD-10-CM | POA: Insufficient documentation

## 2010-06-15 DIAGNOSIS — R42 Dizziness and giddiness: Secondary | ICD-10-CM

## 2010-06-17 NOTE — Assessment & Plan Note (Signed)
Summary: F/U PER MD/RH   Vital Signs:  Patient profile:   77 year old female Weight:      165 pounds Temp:     97.8 degrees F oral Pulse rate:   67 / minute BP sitting:   148 / 78  (left arm) Cuff size:   regular  Vitals Entered By: Tessie Fass CMA (June 04, 2010 9:48 AM) CC: F/U Is Patient Diabetic? No Pain Assessment Patient in pain? no        Primary Care Provider:  Helane Rima DO  CC:  F/U.  History of Present Illness: 77 yo F:  Here for f/u previous visit (see below for Hx of previous visit). Today, states that she feels so much better. "Sometimes I just get confused." Denies HA, dizziness, fever, N/V/D/C, abdominal pain, LE edema, dysuria. She states that her incontinence and noctuira are better since stopping the HCTZ.   HX of last visit: "Not feeling well" - ER visit 1/15 - evaluated for weakness, CBC WNL, BMP stable, EKG WNL, UA with LE and WBC so Dx with UTI. She was monitored in ED and sent home with Kelfex. She took all of her Keflex. Today, she says that she just has periods of weakness. She denies fever/chills, N/V/D/C, abdominal pain, LE edema, HA, focal neurological changes, rash, dysuria. She endorses having URI s/s last week and took OTC cold medications. The periods of weakness have happened while already standing, while sitting, and after eating (not orthostatic or hypoglycemic). No new medications or supplements otherwise. No fall.   Current Medications (verified): 1)  Zoloft 100 Mg Tabs (Sertraline Hcl) .... 1/2 By Mouth Daily 2)  Metoprolol Tartrate 50 Mg Tabs (Metoprolol Tartrate) .... Take 1 Tab By Mouth Daily 3)  Potassium Chloride Cr 10 Meq  Tbcr (Potassium Chloride) .... 2 By Mouth Daily 4)  Epipen 2-Pak 0.3 Mg/0.31ml Devi (Epinephrine) .... Use As Directed 5)  Simvastatin 80 Mg Tabs (Simvastatin) .Marland Kitchen.. 1 By Mouth At Bedtime 6)  Protonix 40 Mg Tbec (Pantoprazole Sodium) .... One By Mouth Daily  Allergies (verified): 1)  ! * Lisinopril 2)  !  * Carvedilol 3)  ! Doxazosin PMH-FH-SH reviewed for relevance  Review of Systems      See HPI  Physical Exam  General:  Elderly female, in no acute distress; alert, appropriate and cooperative throughout examination. Vitals reviewed.  Lungs:  Normal respiratory effort, chest expands symmetrically. Lungs are clear to auscultation, no crackles or wheezes. Heart:  Normal rate and regular rhythm. S1 and S2 normal without gallop, murmur, click, rub or other extra sounds. No carotid bruit. Abdomen:  Soft, normal bowel sounds, no distention, no masses, no guarding, and no rigidity.  Pulses:  R and L carotid, radial, femoral, dorsalis pedis and posterior tibial pulses are full and equal bilaterally. Extremities:  No edema. Neurologic:  Alert & oriented X3, cranial nerves II-XII intact, strength normal in all extremities, sensation intact to light touch, and DTRs symmetrical and normal.     Impression & Recommendations:  Problem # 1:  OTHER MALAISE AND FATIGUE (ICD-780.79) Assessment Improved  Improved. No red flags. Patient with upcoming appointment with cardiology.  Orders: [facility]- Est  Level 4 (21308)  Problem # 2:  RENAL FAILURE, ACUTE (ICD-584.9) Assessment: Improved Improved off HCTZ. Monitor at next visit in 1 month. Orders: Basic Met-[facility] ([id number]) [facility]- Est  Level 4 (52841)  Problem # 3:  UNSPECIFIED GENITAL PROLAPSE (ICD-618.9) Assessment: Unchanged  Referring to GYN.  Orders: [facility]- Est  Level 4 (99214)  Complete Medication List: 1)  Zoloft 100 Mg Tabs (Sertraline hcl) .... 1/2 by mouth daily 2)  Metoprolol Tartrate 50 Mg Tabs (Metoprolol tartrate) .... Take 1 tab by mouth daily 3)  Potassium Chloride Cr 10 Meq Tbcr (Potassium chloride) .... 2 by mouth daily 4)  Epipen 2-pak 0.3 Mg/0.66ml Devi (Epinephrine) .... Use as directed 5)  Simvastatin 80 Mg Tabs (Simvastatin) .Marland Kitchen.. 1 by mouth at bedtime 6)  Protonix 40 Mg Tbec (Pantoprazole sodium) .... One by mouth  daily  Patient Instructions: 1)  It was nice to see you today! 2)  I'm glad that you are feeling better!   Orders Added: 1)  Basic Met-[facility] [54098-11914] 2)  [facility]- Est  Level 4 [78295]

## 2010-06-22 ENCOUNTER — Telehealth: Payer: Self-pay | Admitting: Family Medicine

## 2010-06-22 NOTE — Telephone Encounter (Signed)
Spoke with patient and she states on 06/18/2010 she was helping pick up bricks at her church . The discomfort  in left upper back and shoulder started then. Now when she takes a deep breath the pain catches her. Also states  she is unable to take her BP on her home monitor because it states EE. She thinks  It is because BP is too high. Advised patient she needs to be seen  to evaluate both problems. Advised her to go to urgent care  now for evaluation since we have no available appointment in our office today.. She voices understanding.

## 2010-06-22 NOTE — Telephone Encounter (Signed)
Thinks she has pulled muscles in left back/shoulder - not sure if she should go to UC

## 2010-06-23 ENCOUNTER — Encounter (INDEPENDENT_AMBULATORY_CARE_PROVIDER_SITE_OTHER): Payer: Medicare Other | Admitting: Obstetrics and Gynecology

## 2010-06-23 DIAGNOSIS — R32 Unspecified urinary incontinence: Secondary | ICD-10-CM

## 2010-06-23 DIAGNOSIS — N816 Rectocele: Secondary | ICD-10-CM

## 2010-07-02 ENCOUNTER — Emergency Department (HOSPITAL_COMMUNITY)
Admission: EM | Admit: 2010-07-02 | Discharge: 2010-07-03 | Disposition: A | Discharge status: Home / Self Care | Payer: Medicare Other | Attending: Emergency Medicine | Admitting: Emergency Medicine

## 2010-07-02 DIAGNOSIS — I10 Essential (primary) hypertension: Secondary | ICD-10-CM | POA: Insufficient documentation

## 2010-07-02 DIAGNOSIS — M542 Cervicalgia: Secondary | ICD-10-CM | POA: Insufficient documentation

## 2010-07-02 DIAGNOSIS — M549 Dorsalgia, unspecified: Secondary | ICD-10-CM | POA: Insufficient documentation

## 2010-07-02 DIAGNOSIS — M25519 Pain in unspecified shoulder: Secondary | ICD-10-CM | POA: Insufficient documentation

## 2010-07-02 DIAGNOSIS — R0602 Shortness of breath: Secondary | ICD-10-CM | POA: Insufficient documentation

## 2010-07-02 DIAGNOSIS — R234 Changes in skin texture: Secondary | ICD-10-CM | POA: Insufficient documentation

## 2010-07-02 DIAGNOSIS — Z79899 Other long term (current) drug therapy: Secondary | ICD-10-CM | POA: Insufficient documentation

## 2010-07-02 DIAGNOSIS — R5381 Other malaise: Secondary | ICD-10-CM | POA: Insufficient documentation

## 2010-07-02 DIAGNOSIS — X58XXXA Exposure to other specified factors, initial encounter: Secondary | ICD-10-CM | POA: Insufficient documentation

## 2010-07-02 DIAGNOSIS — Y929 Unspecified place or not applicable: Secondary | ICD-10-CM | POA: Insufficient documentation

## 2010-07-02 DIAGNOSIS — R5383 Other fatigue: Secondary | ICD-10-CM | POA: Insufficient documentation

## 2010-07-02 DIAGNOSIS — T148XXA Other injury of unspecified body region, initial encounter: Secondary | ICD-10-CM | POA: Insufficient documentation

## 2010-07-03 ENCOUNTER — Emergency Department (HOSPITAL_COMMUNITY): Payer: Medicare Other

## 2010-07-03 LAB — DIFFERENTIAL
Basophils Absolute: 0 10*3/uL (ref 0.0–0.1)
Basophils Relative: 0 % (ref 0–1)
Eosinophils Absolute: 0.2 10*3/uL (ref 0.0–0.7)
Eosinophils Relative: 2 % (ref 0–5)
Lymphocytes Relative: 16 % (ref 12–46)
Lymphs Abs: 1.6 10*3/uL (ref 0.7–4.0)
Monocytes Absolute: 0.9 10*3/uL (ref 0.1–1.0)
Monocytes Relative: 9 % (ref 3–12)
Neutro Abs: 7.2 10*3/uL (ref 1.7–7.7)
Neutrophils Relative %: 73 % (ref 43–77)

## 2010-07-03 LAB — TROPONIN I: Troponin I: 0.01 ng/mL (ref 0.00–0.06)

## 2010-07-03 LAB — BASIC METABOLIC PANEL
BUN: 21 mg/dL (ref 6–23)
CO2: 27 mEq/L (ref 19–32)
Calcium: 9.1 mg/dL (ref 8.4–10.5)
Chloride: 103 mEq/L (ref 96–112)
Creatinine, Ser: 1.37 mg/dL — ABNORMAL HIGH (ref 0.4–1.2)
GFR calc Af Amer: 45 mL/min — ABNORMAL LOW (ref >60)
GFR calc non Af Amer: 38 mL/min — ABNORMAL LOW (ref >60)
Glucose, Bld: 103 mg/dL — ABNORMAL HIGH (ref 70–99)
Potassium: 3.8 mEq/L (ref 3.5–5.1)
Sodium: 139 mEq/L (ref 135–145)

## 2010-07-03 LAB — CBC
HCT: 39.9 % (ref 36.0–46.0)
Hemoglobin: 13.3 g/dL (ref 12.0–15.0)
MCH: 30.4 pg (ref 26.0–34.0)
MCHC: 33.3 g/dL (ref 30.0–36.0)
MCV: 91.3 fL (ref 78.0–100.0)
Platelets: 188 10*3/uL (ref 150–400)
RBC: 4.37 MIL/uL (ref 3.87–5.11)
RDW: 13.1 % (ref 11.5–15.5)
WBC: 9.9 10*3/uL (ref 4.0–10.5)

## 2010-07-03 LAB — CK TOTAL AND CKMB (NOT AT ARMC)
CK, MB: 1.1 ng/mL (ref 0.3–4.0)
Relative Index: INVALID (ref 0.0–2.5)
Total CK: 60 U/L (ref 7–177)

## 2010-07-14 ENCOUNTER — Encounter: Payer: Self-pay | Admitting: Family Medicine

## 2010-07-14 ENCOUNTER — Telehealth: Payer: Self-pay | Admitting: *Deleted

## 2010-07-14 ENCOUNTER — Ambulatory Visit (INDEPENDENT_AMBULATORY_CARE_PROVIDER_SITE_OTHER): Payer: Medicare Other | Admitting: Family Medicine

## 2010-07-14 DIAGNOSIS — R531 Weakness: Secondary | ICD-10-CM | POA: Insufficient documentation

## 2010-07-14 DIAGNOSIS — F32A Depression, unspecified: Secondary | ICD-10-CM | POA: Insufficient documentation

## 2010-07-14 DIAGNOSIS — R5383 Other fatigue: Secondary | ICD-10-CM

## 2010-07-14 DIAGNOSIS — N179 Acute kidney failure, unspecified: Secondary | ICD-10-CM

## 2010-07-14 DIAGNOSIS — R300 Dysuria: Secondary | ICD-10-CM

## 2010-07-14 DIAGNOSIS — R5381 Other malaise: Secondary | ICD-10-CM

## 2010-07-14 DIAGNOSIS — F329 Major depressive disorder, single episode, unspecified: Secondary | ICD-10-CM

## 2010-07-14 DIAGNOSIS — F3289 Other specified depressive episodes: Secondary | ICD-10-CM

## 2010-07-14 DIAGNOSIS — E785 Hyperlipidemia, unspecified: Secondary | ICD-10-CM

## 2010-07-14 LAB — BASIC METABOLIC PANEL
BUN: 18 mg/dL (ref 6–23)
CO2: 25 mEq/L (ref 19–32)
Calcium: 9.7 mg/dL (ref 8.4–10.5)
Chloride: 106 mEq/L (ref 96–112)
Creat: 1.47 mg/dL — ABNORMAL HIGH (ref 0.40–1.20)
Glucose, Bld: 99 mg/dL (ref 70–99)
Potassium: 4.7 mEq/L (ref 3.5–5.3)
Sodium: 140 mEq/L (ref 135–145)

## 2010-07-14 LAB — CONVERTED CEMR LAB
BUN: 18 mg/dL (ref 6–23)
CO2: 25 meq/L (ref 19–32)
Calcium: 9.7 mg/dL (ref 8.4–10.5)
Chloride: 106 meq/L (ref 96–112)
Creatinine, Ser: 1.47 mg/dL — ABNORMAL HIGH (ref 0.40–1.20)
Glucose, Bld: 99 mg/dL (ref 70–99)
Potassium: 4.7 meq/L (ref 3.5–5.3)
Sodium: 140 meq/L (ref 135–145)

## 2010-07-14 LAB — POCT URINALYSIS DIPSTICK
Bilirubin, UA: NEGATIVE
Blood, UA: NEGATIVE
Glucose, UA: NEGATIVE
Ketones, UA: NEGATIVE
Nitrite, UA: NEGATIVE
Protein, UA: NEGATIVE
Spec Grav, UA: 1.01
Urobilinogen, UA: 1
pH, UA: 6

## 2010-07-14 LAB — POCT UA - MICROSCOPIC ONLY

## 2010-07-14 MED ORDER — SERTRALINE HCL 100 MG PO TABS: 100.0000 mg | ORAL_TABLET | ORAL | Status: DC

## 2010-07-14 MED ORDER — SIMVASTATIN 80 MG PO TABS: 80.0000 mg | ORAL_TABLET | Freq: Every day | ORAL | Status: DC

## 2010-07-14 MED ORDER — CEPHALEXIN 500 MG PO CAPS: 500.0000 mg | ORAL_CAPSULE | Freq: Three times a day (TID) | ORAL | Status: AC

## 2010-07-14 MED ORDER — SERTRALINE HCL 100 MG PO TABS: 100.0000 mg | ORAL_TABLET | Freq: Every day | ORAL | Status: DC

## 2010-07-14 NOTE — Telephone Encounter (Signed)
Called patient and informed of UTI and Rx sent to pharmacy.Prisca Gearing, [name]

## 2010-07-14 NOTE — Telephone Encounter (Signed)
Message copied by Tessie Fass on Wed Jul 14, 2010  5:41 PM ------      Message from: Helane Rima      Created: Wed Jul 14, 2010 10:36 AM       Please call patient to let her know that she has a UTI which may be contributing to her symptoms. I sent in Cephalexin.

## 2010-07-14 NOTE — Patient Instructions (Signed)
It was nice to see you today.  I am checking your urine - please let your urologist know that I will order a culture and can send it to him/her.  I am checking labs today.  I have increased your Zoloft.  We are sending you to physical therapy and will have someone come to your house to make sure that you are safe.  Follow up with your cardiologist.  DO NOT TAKE VALIUM!!!!!!!!!!

## 2010-07-14 NOTE — Assessment & Plan Note (Signed)
Recheck BMP.

## 2010-07-14 NOTE — Assessment & Plan Note (Signed)
Uncontrolled. Patient states that she will see her cardiologist about this issue. If Cr high, will instruct patient to stop HCTZ.

## 2010-07-15 ENCOUNTER — Telehealth: Payer: Self-pay | Admitting: *Deleted

## 2010-07-15 NOTE — Telephone Encounter (Signed)
Called patient and told her to stop taking the aleve and start drinking plenty of water. Asked her to call front desk and schedule appointment within 1-2 weeks for f/u with dr Earlene Plater.Destiny Moore, [name]

## 2010-07-15 NOTE — Telephone Encounter (Signed)
Message copied by Tessie Fass on Thu Jul 15, 2010 11:25 AM ------      Message from: Helane Rima      Created: Thu Jul 15, 2010  8:09 AM       Please call this patient to tell her not to take Aleve anymore because it is hurting her kidneys. She needs to drink lots of water. Ask her to follow up in 1-2 weeks.

## 2010-07-16 LAB — URINE CULTURE: Colony Count: 25000

## 2010-07-21 ENCOUNTER — Ambulatory Visit (INDEPENDENT_AMBULATORY_CARE_PROVIDER_SITE_OTHER): Payer: Medicare Other | Admitting: Obstetrics and Gynecology

## 2010-07-21 DIAGNOSIS — R32 Unspecified urinary incontinence: Secondary | ICD-10-CM

## 2010-07-22 NOTE — Progress Notes (Signed)
  Subjective:    Patient ID: Destiny Moore, female    DOB: Oct 08, 1932, 77 y.o.   MRN: [id number]  HPI  1. Hospital Follow-Up: Patient presented to [facility] on 07/02/10 with CC: Neck Pain. Dx: Muscle Strain. Work-Up at that time included CBC, CE x 1, BMP, CXR, and EKG. All were WNL with exception of Cr = 1.37. Noted to have Cr = 1.27 on 06/04/10 off HCTZ. She was given Rx for Valium and Percocet. She took the Valium and Percocet and reports dizziness and weakness over the past week. No falls. No CP, SOB, N/V/D/C, LE edema, dysuria, abdominal pain.  NOTE: The patient has presented two other times with vague complaints, then followed up in 1 week or less (after extensive work-up) stating that she feels much better. Please see other notes for details.  NOTE: The patient's husband presented with her today. His first statement was that he takes care of her all day and is tired, so he wants to put her in the hospital for a few days until we figure out what is wrong.  Recent Studies: 2DECHO: Left ventricle: The cavity size was normal. Wall thickness was normal. Systolic function was normal. The estimated ejection fraction was in the range of 55% to 60%. Wall motion was normal; there were no regional wall motion abnormalities. Doppler parameters are consistent with abnormal left ventricular relaxation (grade 1 diastolic dysfunction). - Left atrium: The atrium was mildly dilated.  Carotid Dopplers:  No significant extracranial carotid artery stenosis demonstrated. Vertebrals are patent with antegrade flow.  Review of Systems See HPI.    Objective:   Physical Exam  Constitutional: She is oriented to person, place, and time. Vital signs are normal. She appears well-developed and well-nourished. No distress.  HENT:  Head: Normocephalic and atraumatic.  Right Ear: Tympanic membrane normal.  Left Ear: Tympanic membrane normal.  Nose: Nose normal.  Mouth/Throat: Uvula is midline, oropharynx is clear and moist  and mucous membranes are normal.  Eyes: EOM are normal. Pupils are equal, round, and reactive to light.  Neck: Neck supple. No JVD present. Carotid bruit is not present. No thyromegaly present.  Cardiovascular: Normal rate, regular rhythm, normal heart sounds and normal pulses.   Pulmonary/Chest: Effort normal and breath sounds normal.  Abdominal: Soft. Normal appearance and bowel sounds are normal. There is no hepatosplenomegaly. There is no tenderness. There is no CVA tenderness.  Musculoskeletal:       Patient with NORMAL SYMMETRIC STRENGTH. She was able to rise from a seated position without using her arms at all.  Neurological: She is alert and oriented to person, place, and time. She has normal strength and normal reflexes. No cranial nerve deficit. She displays a negative Romberg sign.  Psychiatric: Her mood appears anxious. Her affect is labile. Her speech is tangential. Cognition and memory are not impaired. She exhibits a depressed mood. She exhibits normal recent memory and normal remote memory.       MMSE WNL       Assessment & Plan:

## 2010-07-23 LAB — CBC
HCT: 44.3 % (ref 36.0–46.0)
Hemoglobin: 14.7 g/dL (ref 12.0–15.0)
MCHC: 33.2 g/dL (ref 30.0–36.0)
MCV: 93 fL (ref 78.0–100.0)
Platelets: 169 10*3/uL (ref 150–400)
RBC: 4.76 MIL/uL (ref 3.87–5.11)
RDW: 13.3 % (ref 11.5–15.5)
WBC: 7.5 10*3/uL (ref 4.0–10.5)

## 2010-07-23 LAB — BASIC METABOLIC PANEL
BUN: 28 mg/dL — ABNORMAL HIGH (ref 6–23)
CO2: 22 mEq/L (ref 19–32)
CO2: 26 mEq/L (ref 19–32)
Calcium: 9 mg/dL (ref 8.4–10.5)
Chloride: 105 mEq/L (ref 96–112)
Chloride: 112 mEq/L (ref 96–112)
Creatinine, Ser: 1.82 mg/dL — ABNORMAL HIGH (ref 0.4–1.2)
GFR calc Af Amer: 33 mL/min — ABNORMAL LOW (ref >60)
GFR calc non Af Amer: 27 mL/min — ABNORMAL LOW (ref >60)
GFR calc non Af Amer: 36 mL/min — ABNORMAL LOW (ref >60)
Glucose, Bld: 171 mg/dL — ABNORMAL HIGH (ref 70–99)
Glucose, Bld: 245 mg/dL — ABNORMAL HIGH (ref 70–99)
Potassium: 3.5 mEq/L (ref 3.5–5.1)
Potassium: 3.7 mEq/L (ref 3.5–5.1)
Sodium: 139 mEq/L (ref 135–145)
Sodium: 140 mEq/L (ref 135–145)

## 2010-07-23 LAB — DIFFERENTIAL
Basophils Absolute: 0 10*3/uL (ref 0.0–0.1)
Basophils Relative: 0 % (ref 0–1)
Eosinophils Absolute: 0 10*3/uL (ref 0.0–0.7)
Eosinophils Relative: 1 % (ref 0–5)
Lymphocytes Relative: 18 % (ref 12–46)
Lymphs Abs: 1.3 10*3/uL (ref 0.7–4.0)
Monocytes Absolute: 0.1 10*3/uL (ref 0.1–1.0)
Monocytes Relative: 1 % — ABNORMAL LOW (ref 3–12)
Neutro Abs: 6 10*3/uL (ref 1.7–7.7)
Neutrophils Relative %: 81 % — ABNORMAL HIGH (ref 43–77)

## 2010-07-23 LAB — CK TOTAL AND CKMB (NOT AT ARMC)
CK, MB: 2.2 ng/mL (ref 0.3–4.0)
Relative Index: INVALID (ref 0.0–2.5)
Total CK: 52 U/L (ref 7–177)

## 2010-07-23 LAB — CARDIAC PANEL(CRET KIN+CKTOT+MB+TROPI)
CK, MB: 4.1 ng/mL — ABNORMAL HIGH (ref 0.3–4.0)
Relative Index: 2.8 — ABNORMAL HIGH (ref 0.0–2.5)
Total CK: 62 U/L (ref 7–177)

## 2010-07-23 LAB — POCT CARDIAC MARKERS
CKMB, poc: <1 ng/mL — ABNORMAL LOW (ref 1.0–8.0)
Myoglobin, poc: 256 ng/mL (ref 12–200)
Troponin i, poc: <0.05 ng/mL (ref 0.00–0.09)

## 2010-07-23 LAB — MRSA PCR SCREENING: MRSA by PCR: NEGATIVE

## 2010-07-23 NOTE — Progress Notes (Unsigned)
[name], [name] NO.:  [id number]  MEDICAL RECORD NO.:  [id number]           PATIENT TYPE:  A  LOCATION:  [facility]                   FACILITY:  [facility]  PHYSICIAN:  Argentina Donovan, MD        DATE OF BIRTH:  1933-04-28  DATE OF SERVICE:  06/23/2010                                 CLINIC NOTE  The patient is a 77 year old white female, gravida 2, para 2-0-0-2, who is referred to [place] by family practice for prolapsed bladder.  The patient's history basically is that she had a abdominal hysterectomy back in the 1970s, at which time they took out her ovaries.  In the 1980s, she had some kind of a bladder procedure, a "tack up" she called, I expect it was either Burch's or Marshall-Marchetti-Krantz procedure, and she did very well after that.  Recently, she has had a problem having to wear a pad all the time.  She has significant urinary urgency with stress incontinence and also often has to put her finger in to the vagina to push down the posterior wall of the vagina to evacuate when she has a bowel movement.  On examination, the abdomen is soft, nontender.  No masses, no organomegaly.  The external genitalia is normal.  The introitus is somewhat atrophic with very little sign of a significant cystocele.  The anterior wall of the vagina is very atrophic with status hysterectomy, but she does have a significant rectocele.  I am not certain of the best procedure to do in this lady for correction of her problem.  I think she should be certainly checked by urologist prior to any procedure being done, and they may prefer to do the bladder repair themselves.  The rectocele is not a significant problem at the present time and that could be corrected at a later time or even in conjunction with the urologist's help.  I am going to have them refer her over to have a Urology evaluation, and we will go by whatever the recommendations are at the present time.  IMPRESSION:  Urinary  stress incontinence with urinary urgency, possibly overflow incontinence, and symptomatic rectocele.          ______________________________ Argentina Donovan, MD    PR/MEDQ  D:  06/23/2010  T:  06/24/2010  Job:  [id number]

## 2010-07-23 NOTE — Assessment & Plan Note (Signed)
Patient's exam show symmetric normal strength UE/LE. She is able to rise from a seated poistion without using her arms. Her weakness could definitely have been caused by Percocet and Valium. I told the patient that she is never to take Valium again - she is a terrible candidate for this medication. Will check UA and recheck BMP today and UA not done at ER visit and Cr elevated on last BMP.

## 2010-07-23 NOTE — Assessment & Plan Note (Signed)
After speaking with this patient about her medical complaints and getting to know her a little more, I believe that some of her issues are arising as a result of depression. I asked her about this during the visit and she started to cry. She agreed that she feels depressed. No SI/HI. Her MMSE has been normal at the last 3 visits. I do not believe that dementia is an issue here. The patient has Zoloft on her medication list, but admits that she is not taking it. She agreed to restart and follow up in 3-4 weeks.

## 2010-07-26 ENCOUNTER — Other Ambulatory Visit: Payer: Self-pay | Admitting: *Deleted

## 2010-07-26 ENCOUNTER — Encounter: Payer: Self-pay | Admitting: *Deleted

## 2010-07-26 ENCOUNTER — Ambulatory Visit (INDEPENDENT_AMBULATORY_CARE_PROVIDER_SITE_OTHER): Payer: Medicare Other | Admitting: Nurse Practitioner

## 2010-07-26 ENCOUNTER — Encounter: Payer: Self-pay | Admitting: Nurse Practitioner

## 2010-07-26 VITALS — BP 130/80 | HR 58 | Resp 20 | Wt 162.0 lb

## 2010-07-26 DIAGNOSIS — F32A Depression, unspecified: Secondary | ICD-10-CM

## 2010-07-26 DIAGNOSIS — N390 Urinary tract infection, site not specified: Secondary | ICD-10-CM

## 2010-07-26 DIAGNOSIS — I10 Essential (primary) hypertension: Secondary | ICD-10-CM

## 2010-07-26 DIAGNOSIS — R5381 Other malaise: Secondary | ICD-10-CM

## 2010-07-26 DIAGNOSIS — R5383 Other fatigue: Secondary | ICD-10-CM

## 2010-07-26 DIAGNOSIS — R531 Weakness: Secondary | ICD-10-CM

## 2010-07-26 DIAGNOSIS — R69 Illness, unspecified: Secondary | ICD-10-CM

## 2010-07-26 DIAGNOSIS — F329 Major depressive disorder, single episode, unspecified: Secondary | ICD-10-CM

## 2010-07-26 DIAGNOSIS — R6889 Other general symptoms and signs: Secondary | ICD-10-CM | POA: Insufficient documentation

## 2010-07-26 LAB — URINALYSIS
Bilirubin Urine: NEGATIVE
Glucose, UA: NEGATIVE mg/dL
Hgb urine dipstick: NEGATIVE
Leukocytes, UA: NEGATIVE
Nitrite: NEGATIVE
Protein, ur: NEGATIVE mg/dL
Specific Gravity, Urine: 1.018 (ref 1.005–1.030)
Urobilinogen, UA: 0.2 mg/dL (ref 0.0–1.0)
pH: 7 (ref 5.0–8.0)

## 2010-07-26 LAB — TSH: TSH: 1.545 u[IU]/mL (ref 0.350–4.500)

## 2010-07-26 LAB — COMPREHENSIVE METABOLIC PANEL
ALT: 17 U/L (ref 0–35)
AST: 23 U/L (ref 0–37)
Albumin: 4.2 g/dL (ref 3.5–5.2)
Alkaline Phosphatase: 70 U/L (ref 39–117)
BUN: 23 mg/dL (ref 6–23)
CO2: 21 mEq/L (ref 19–32)
Calcium: 9.5 mg/dL (ref 8.4–10.5)
Chloride: 106 mEq/L (ref 96–112)
Creat: 1.82 mg/dL — ABNORMAL HIGH (ref 0.40–1.20)
Glucose, Bld: 99 mg/dL (ref 70–99)
Potassium: 4.4 mEq/L (ref 3.5–5.3)
Sodium: 141 mEq/L (ref 135–145)
Total Bilirubin: 0.6 mg/dL (ref 0.3–1.2)
Total Protein: 6.8 g/dL (ref 6.0–8.3)

## 2010-07-26 LAB — CBC
HCT: 40.7 % (ref 36.0–46.0)
Hemoglobin: 13.6 g/dL (ref 12.0–15.0)
MCH: 30 pg (ref 26.0–34.0)
MCHC: 33.4 g/dL (ref 30.0–36.0)
MCV: 89.6 fL (ref 78.0–100.0)
Platelets: 215 10*3/uL (ref 150–400)
RBC: 4.54 MIL/uL (ref 3.87–5.11)
RDW: 13 % (ref 11.5–15.5)
WBC: 6.2 10*3/uL (ref 4.0–10.5)

## 2010-07-26 NOTE — Telephone Encounter (Signed)
cardura 1mg  called in to [facility]

## 2010-07-26 NOTE — Patient Instructions (Signed)
Cut your metoprolol in half and only take a half a tablet 2 times each day. We are going to check labs and a urine today. Monitor your blood pressure at home. See Dr. Earlene Plater tomorrow as scheduled. See Dr. Elease Hashimoto in a couple of weeks.

## 2010-07-26 NOTE — Assessment & Plan Note (Signed)
She stopped her Zoloft and did not taper. I suspect that is making her feel bad. She does not wish to restart. She is to discuss with her PCP tomorrow.

## 2010-07-26 NOTE — Progress Notes (Signed)
History of Present Illness: Destiny Moore is seen today for a work in visit. She is seen for Dr. Elease Hashimoto. She is here with her husband. She is not feeling well. She says she has felt bad for the past 2 months. She "can't do anything". She thinks her blood pressure is up and down. She thinks it drops with activities. She only checks her blood pressure once or twice a day. She feels shaky. She is not really able to do her activities of daily living. She was told she was depressed and given Zoloft. It does not sound like she was on it very long, but she stopped it altogether last Wednesday. She is to see her PCP tomorrow. She has been terribly constipated and is using stool softeners. She has had bladder issues and does not empty completely. She is for a urinary dynamic study. She denies chest pain. She is dizzy.   Current Outpatient Prescriptions on File Prior to Visit  Medication Sig Dispense Refill  . doxazosin (CARDURA) 1 MG tablet Take 1 mg by mouth at bedtime.        . hydrochlorothiazide 12.5 MG capsule Take 12.5 mg by mouth every morning.       . metoprolol (LOPRESSOR) 50 MG tablet Take 50 mg by mouth daily.        . pantoprazole (PROTONIX) 40 MG tablet Take 40 mg by mouth daily.        . potassium chloride (MICRO-K) 10 MEQ CR capsule Take 2 tabs by mouth daily      . simvastatin (ZOCOR) 80 MG tablet Take 1 tablet (80 mg total) by mouth at bedtime.  30 tablet  6  . EPINEPHrine (EPIPEN) 0.3 mg/0.3 mL DEVI Inject 0.3 mg into the muscle as directed.        . sertraline (ZOLOFT) 100 MG tablet Take 1 tablet (100 mg total) by mouth as directed. Take 1/2 pill by mouth daily.  15 tablet  2    Allergies  Allergen Reactions  . Coreg Anaphylaxis  . Carvedilol   . Doxazosin     Patient is taking this medicine currently.  . Lisinopril     REACTION: Anaphylaxis    Past Medical History  Diagnosis Date  . Hypertension   . Hyperlipidemia   . Anxiety   . UTI (urinary tract infection)   . Depression      Past Surgical History  Procedure Date  . Hystrectomy   . Bladder tact     History  Smoking status  . Never Smoker   Smokeless tobacco  . Not on file    History  Alcohol Use No    Family History  Problem Relation Age of Onset  . Heart disease Mother     Review of Systems: The review of systems is positive for feeling shaky. She denies chest pain. She has had UTI's and constipation. Her husband says she has not enjoyed good health in quite some time.  All other systems were reviewed and are negative.  Physical Exam: BP 130/80  Pulse 58  Resp 20  Wt 162 lb (73.483 kg)  SpO2 98% She appears chronically ill. She seems anxious to me. Skin is warm and dry. Color is normal. HEENT is unremarkable. Lungs are clear. Cardiac exam shows a regular rate and rhythm. Abdomen is soft. I cannot appreciate any bruits. She has no edema. Gait and ROM are intact. She has no gross neurologic deficits.  ECG:  N/A  Assessment / Plan:

## 2010-07-26 NOTE — Assessment & Plan Note (Signed)
I'm not sure what to make of her other symptoms. We will check her labs today. She may have a recurrent UTI. She will see her PCP tomorrow. We may consider an event monitor if symptoms persist.

## 2010-07-26 NOTE — Assessment & Plan Note (Addendum)
Her lying, sitting and standing blood pressures are satisfactory by my exam. She is on Metoprolol tartrate taking the whole dose in the morning. I will have her break the tablet in half and use 1/2 tablet BID. I will have her see Dr. Elease Hashimoto for follow up. We may want to consider an event monitor if her symptoms persist and her blood pressure is stable.

## 2010-07-27 ENCOUNTER — Encounter: Payer: Self-pay | Admitting: Family Medicine

## 2010-07-27 ENCOUNTER — Telehealth: Payer: Self-pay | Admitting: *Deleted

## 2010-07-27 ENCOUNTER — Ambulatory Visit (INDEPENDENT_AMBULATORY_CARE_PROVIDER_SITE_OTHER): Payer: Medicare Other | Admitting: Family Medicine

## 2010-07-27 VITALS — BP 137/75 | HR 61 | Temp 98.0°F | Wt 161.0 lb

## 2010-07-27 DIAGNOSIS — R6889 Other general symptoms and signs: Secondary | ICD-10-CM

## 2010-07-27 DIAGNOSIS — R69 Illness, unspecified: Secondary | ICD-10-CM

## 2010-07-27 NOTE — Telephone Encounter (Signed)
Pt notified with labs and they were faxed to PCP along with the OV note.

## 2010-07-27 NOTE — Telephone Encounter (Signed)
Message copied by Erskine Squibb on Tue Jul 27, 2010  8:36 AM ------      Message from: Norma Fredrickson      Created: Tue Jul 27, 2010  7:46 AM       Her labs are all ok. No UTI. Please fax copy to her PCP along with my note from yesterday. If her symptoms persist, needs event monitor placed.

## 2010-08-02 ENCOUNTER — Encounter: Payer: Self-pay | Admitting: Home Health Services

## 2010-08-02 NOTE — Progress Notes (Signed)
  Subjective:    Patient ID: Destiny Moore, female    DOB: Feb 03, 1933, 77 y.o.   MRN: [id number]  HPI  Patient presenting for follow-up. Please see extensive note from last OV. Patient states that she "feels great" today. She denies weakness, malaise, HA, dizziness, or any other previous complaint. Work-up was negative. She started Zoloft, but stopped after a few days because it made her "shake."   Review of Systems SEE HPI.    Objective:   Physical Exam  Constitutional: She appears well-developed and well-nourished. No distress.  Cardiovascular: Normal rate and normal heart sounds.   Pulmonary/Chest: Breath sounds normal.  Psychiatric:       Happy, smiling.      Assessment & Plan:

## 2010-08-02 NOTE — Assessment & Plan Note (Signed)
This is the third time that the patient has presented with vague, somatic complaints, had a negative work-up (with the exception of UTI x 1), and followed-up feeling "great." I do suspect depression to play a part in her s/s. MMSE WNL. Exam always same with no red flags. Will not add anything today. Will follow.

## 2010-09-01 ENCOUNTER — Encounter: Payer: Self-pay | Admitting: Family Medicine

## 2010-09-02 ENCOUNTER — Other Ambulatory Visit: Payer: Self-pay | Admitting: Family Medicine

## 2010-09-02 ENCOUNTER — Ambulatory Visit (INDEPENDENT_AMBULATORY_CARE_PROVIDER_SITE_OTHER): Payer: Medicare Other | Admitting: Home Health Services

## 2010-09-02 ENCOUNTER — Encounter: Payer: Self-pay | Admitting: Home Health Services

## 2010-09-02 VITALS — BP 161/83 | HR 60 | Temp 98.0°F | Ht 62.5 in | Wt 163.0 lb

## 2010-09-02 DIAGNOSIS — E785 Hyperlipidemia, unspecified: Secondary | ICD-10-CM

## 2010-09-02 DIAGNOSIS — Z0000 Encounter for general adult medical examination without abnormal findings: Secondary | ICD-10-CM

## 2010-09-02 NOTE — Progress Notes (Signed)
Patient here for annual wellness visit, patient reports: Risk Factors/Conditions needing evaluation or treatment: Patient does not have any risk factors that need evaluation.  Home Safety: Patient lives in 1 story home with husband, son and step son.  Patient reports having smoke detectors and adaptive equipment in the bathroom. Other Information: Corrective lens: Patient wears daily corrective lens and visits eye doctor once per 2 years.  Pt reports developing cataracts in both eyes. Dentures: Patient has a partial and visits dentist as needed. Memory: Patient reports some memory loss. Patient's Mini Mental Score (recorded in doc. flowsheet): 29   Balance Abnormal Patient value  Sitting balance    Arise    Attempts to arise    Immediate standing balance    Standing balance    Nudge    Eyes closed    360 degree turn    Sitting down     Gait Abnormal Patient value  Initiation of gait    Step length-left    Step length-right    Step height-left    Step height-right    Step symmetry    Step continuity    Path    Trunk    Walking stance     Patient reported feeling dizzy when asked to put head back and rotate neck.  Patient moves well and has good stride/step symmetry.   Annual Wellness Visit Requirements Recorded Today In  Medical, family, social history Past Medical, Family, Social History Section  Current providers Care team  Current medications Medications  Wt, BP, Ht, BMI Vital signs  Hearing assessment (welcome visit) declined  Tobacco, alcohol, illicit drug use History  ADL Nurse Assessment  Depression Screening Nurse Assessment  Cognitive impairment Nurse Assessment  Mini Mental Status Document Flowsheet  Fall Risk Nurse Assessment  Home Safety Progress Note  End of Life Planning (welcome visit) Social Documentation  Medicare preventative services Progress Note  Risk factors/conditions needing evaluation/treatment Progress Note  Personalized health advice  Patient Instructions, goals, letter  Diet & Exercise Social Documentation  Emergency Contact Social Documentation  Seat Belts Social Documentation  Sun exposure/protection Social Documentation    Prevention Plan: Patient self reported completing several screenings.  Recommended that she schedule another mammogram since it has been several years since previous screening.  Also recommended she call pharmacy for shingles vaccine.   Recommended Medicare Prevention Screenings Women over 36 Test For Frequency Date of Last- BOLD if needed  Breast Cancer 1-2 yrs To be scheduled  Cervical Cancer 1-3 yrs hysterectomy  Colorectal Cancer 1-10 yrs Patient said she thought she had one.  Osteoporosis once Done osteoporsis  Cholesterol 5 yrs scheduled  Diabetes yearly Non diabetic  HIV yearly declined  Influenza Shot yearly 10/11- [facility]  Pneumonia Shot once done  Zostavax Shot once recommended

## 2010-09-02 NOTE — Progress Notes (Signed)
I have reviewed this visit and discussed with Suzanne Lineberry and agree with her documentation  

## 2010-09-02 NOTE — Patient Instructions (Signed)
1. Eat at least 2-3 vegetables a day. 2. Daily weight bearing chair exercises. 3. Walk at least 1 times a week.  4. Schedule a mammogram. 5. Review your medical wishes with your family and bring a completed copy to Dr. Earlene Plater.  6. Remove area rugs in your home and be sure to turn on light when getting up in the night to prevent falls.

## 2010-09-07 ENCOUNTER — Other Ambulatory Visit: Payer: Self-pay | Admitting: Family Medicine

## 2010-09-07 DIAGNOSIS — Z1231 Encounter for screening mammogram for malignant neoplasm of breast: Secondary | ICD-10-CM

## 2010-09-08 ENCOUNTER — Other Ambulatory Visit: Payer: Medicare Other

## 2010-09-08 DIAGNOSIS — E785 Hyperlipidemia, unspecified: Secondary | ICD-10-CM

## 2010-09-08 LAB — LIPID PANEL
LDL Cholesterol: 63 mg/dL (ref 0–99)
Total CHOL/HDL Ratio: 3.3 Ratio
VLDL: 31 mg/dL (ref 0–40)

## 2010-09-08 LAB — BASIC METABOLIC PANEL
Potassium: 3.8 mEq/L (ref 3.5–5.3)
Sodium: 143 mEq/L (ref 135–145)

## 2010-09-08 NOTE — Progress Notes (Signed)
Bmp and flp done today Destiny Moore 

## 2010-09-24 ENCOUNTER — Ambulatory Visit
Admission: RE | Admit: 2010-09-24 | Discharge: 2010-09-24 | Disposition: A | Discharge status: Home / Self Care | Payer: Medicare Other | Source: Ambulatory Visit | Attending: Family Medicine | Admitting: Family Medicine

## 2010-09-24 DIAGNOSIS — Z1231 Encounter for screening mammogram for malignant neoplasm of breast: Secondary | ICD-10-CM

## 2010-10-01 ENCOUNTER — Other Ambulatory Visit: Payer: Self-pay | Admitting: Family Medicine

## 2010-10-01 DIAGNOSIS — R928 Other abnormal and inconclusive findings on diagnostic imaging of breast: Secondary | ICD-10-CM

## 2010-10-08 ENCOUNTER — Ambulatory Visit
Admission: RE | Admit: 2010-10-08 | Discharge: 2010-10-08 | Disposition: A | Discharge status: Home / Self Care | Payer: Medicare Other | Source: Ambulatory Visit | Attending: Family Medicine | Admitting: Family Medicine

## 2010-10-08 DIAGNOSIS — R928 Other abnormal and inconclusive findings on diagnostic imaging of breast: Secondary | ICD-10-CM

## 2010-11-25 ENCOUNTER — Other Ambulatory Visit: Payer: Self-pay | Admitting: Family Medicine

## 2010-11-25 NOTE — Telephone Encounter (Signed)
Refill request

## 2011-01-11 ENCOUNTER — Ambulatory Visit (INDEPENDENT_AMBULATORY_CARE_PROVIDER_SITE_OTHER): Payer: Medicare Other | Admitting: Family Medicine

## 2011-01-11 DIAGNOSIS — I10 Essential (primary) hypertension: Secondary | ICD-10-CM

## 2011-01-11 DIAGNOSIS — R109 Unspecified abdominal pain: Secondary | ICD-10-CM | POA: Insufficient documentation

## 2011-01-11 DIAGNOSIS — F32A Depression, unspecified: Secondary | ICD-10-CM

## 2011-01-11 DIAGNOSIS — N179 Acute kidney failure, unspecified: Secondary | ICD-10-CM

## 2011-01-11 DIAGNOSIS — F3289 Other specified depressive episodes: Secondary | ICD-10-CM

## 2011-01-11 DIAGNOSIS — F329 Major depressive disorder, single episode, unspecified: Secondary | ICD-10-CM

## 2011-01-11 NOTE — Assessment & Plan Note (Signed)
Seen previously and given enema for constipation, felt better after that.  Now still having to strain.  Will start colace BID and miralax PRN.  I dont think this is more than constipation but will check CBC, CMET to make sure no big changes.

## 2011-01-11 NOTE — Patient Instructions (Addendum)
Please make an appt in the geriatric clinic to follow up next week on thursday Please take colace every day, twice a day You can also take the miralax that [facility] gave you If you start having fevers or you vomit, please come back sooner

## 2011-01-11 NOTE — Assessment & Plan Note (Signed)
Pt tearful in exam room, did not want to start medication for depression but was worried about her nerves getting bad when her BP was up.  Will send to geri clinic for evaluation.

## 2011-01-11 NOTE — Assessment & Plan Note (Signed)
Blood pressure ok today given that pt is older and occasionally feels dizzy.  Will not make any changes now.

## 2011-01-11 NOTE — Progress Notes (Signed)
  Subjective:    Patient ID: Destiny Moore, female    DOB: April 25, 1933, 77 y.o.   MRN: [id number]  HPI Pt here today for eval of side and abd pain as well as concern about BP  Back and side and abdominal pain x 2 weeks. Seen at [facility] for this and diagnosed with constipation.  Started miralax but after 3 days and no BM, got enema.  Felt slightly better but still straining at stool.  Still eating and drinking well, no N/V, no fevers.    Depression- pt with history of sadness, today tearful in office.  She feels that her health is significantly worse over the last year.  She states that her husband takes excellent care of her and she is not concerned about her care.  She did not feel well on the antidepressant she started so she stopped it.   HTN- pt states she checks her BP several times per day and sometimes ok, sometimes very high.  She says taht this gives her a HA and makes her nerves bad.  No CP, no SOB   Review of Systems    see above Objective:   Physical Exam  GEN- vitals reviewed.  Pt is alert and oriented.  Pale, tearful Heart - normal rate, regular rhythm, normal S1, S2, no murmurs, rubs, clicks or gallops Chest - clear to auscultation, no wheezes, rales or rhonchi, symmetric air entry, no tachypnea, retractions or cyanosis ABD- tender diffusely.  No distension.  BS positive.  No CVA tenderness Spine- no point tenderness over spine Ext- no swelling Neuro- CN II-XII intact, able to get out of chair with little assistance from hands, able to walk well to door and back.        Assessment & Plan:  Abdominal pain Seen previously and given enema for constipation, felt better after that.  Now still having to strain.  Will start colace BID and miralax PRN.  I dont think this is more than constipation but will check CBC, CMET to make sure no big changes.    Depression Pt tearful in exam room, did not want to start medication for depression but was worried about her nerves getting  bad when her BP was up.  Will send to geri clinic for evaluation.  ESSENTIAL HYPERTENSION Blood pressure ok today given that pt is older and occasionally feels dizzy.  Will not make any changes now.

## 2011-01-12 ENCOUNTER — Ambulatory Visit: Payer: Medicare Other | Admitting: Family Medicine

## 2011-01-12 LAB — COMPREHENSIVE METABOLIC PANEL
ALT: 16 U/L (ref 0–35)
Albumin: 4.3 g/dL (ref 3.5–5.2)
CO2: 25 mEq/L (ref 19–32)
Glucose, Bld: 90 mg/dL (ref 70–99)
Potassium: 4.7 mEq/L (ref 3.5–5.3)
Sodium: 141 mEq/L (ref 135–145)
Total Protein: 6.9 g/dL (ref 6.0–8.3)

## 2011-01-12 LAB — CBC
MCH: 30.2 pg (ref 26.0–34.0)
Platelets: 196 10*3/uL (ref 150–400)
RBC: 4.64 MIL/uL (ref 3.87–5.11)
RDW: 13.4 % (ref 11.5–15.5)
WBC: 6.6 10*3/uL (ref 4.0–10.5)

## 2011-01-13 ENCOUNTER — Other Ambulatory Visit: Payer: Self-pay | Admitting: Family Medicine

## 2011-01-13 NOTE — Telephone Encounter (Signed)
Refill request

## 2011-01-20 ENCOUNTER — Ambulatory Visit (INDEPENDENT_AMBULATORY_CARE_PROVIDER_SITE_OTHER): Payer: Medicare Other | Admitting: Family Medicine

## 2011-01-20 ENCOUNTER — Encounter: Payer: Self-pay | Admitting: Family Medicine

## 2011-01-20 ENCOUNTER — Other Ambulatory Visit: Payer: Self-pay | Admitting: Family Medicine

## 2011-01-20 VITALS — BP 168/94 | HR 64 | Wt 158.3 lb

## 2011-01-20 DIAGNOSIS — R52 Pain, unspecified: Secondary | ICD-10-CM | POA: Insufficient documentation

## 2011-01-20 DIAGNOSIS — Z780 Asymptomatic menopausal state: Secondary | ICD-10-CM

## 2011-01-20 DIAGNOSIS — I10 Essential (primary) hypertension: Secondary | ICD-10-CM

## 2011-01-20 DIAGNOSIS — R269 Unspecified abnormalities of gait and mobility: Secondary | ICD-10-CM | POA: Insufficient documentation

## 2011-01-20 DIAGNOSIS — F329 Major depressive disorder, single episode, unspecified: Secondary | ICD-10-CM

## 2011-01-20 DIAGNOSIS — F32A Depression, unspecified: Secondary | ICD-10-CM

## 2011-01-20 DIAGNOSIS — K5900 Constipation, unspecified: Secondary | ICD-10-CM | POA: Insufficient documentation

## 2011-01-20 DIAGNOSIS — F3289 Other specified depressive episodes: Secondary | ICD-10-CM

## 2011-01-20 MED ORDER — DOXAZOSIN MESYLATE 1 MG PO TABS: 1.0000 mg | ORAL_TABLET | Freq: Every day | ORAL | Status: DC

## 2011-01-20 MED ORDER — METOPROLOL TARTRATE 50 MG PO TABS: 50.0000 mg | ORAL_TABLET | Freq: Every day | ORAL | Status: DC

## 2011-01-20 MED ORDER — METOPROLOL TARTRATE 50 MG PO TABS: 50.0000 mg | ORAL_TABLET | Freq: Two times a day (BID) | ORAL | Status: DC
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
## 2011-01-20 NOTE — Assessment & Plan Note (Signed)
Highly recommended physical therapy for balance.  She is the classic older women in fair condition but has not kept up with exercise and is somewhat unsteady.  She may have had side effects to medications that caused her to fall.  She needs encouragement, to exercise and improve strength.

## 2011-01-20 NOTE — Assessment & Plan Note (Signed)
Hard to pin down, consider vertebral fracture with dermatomal distribution, will check bone density. Follow up with primary MD.  She is not a pill taker, she has Vicodin at home and uses 1/2 tab once in a while when the pain is not tolerable.
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
## 2011-01-20 NOTE — Patient Instructions (Signed)
Bone Density Test to be done and follow up with Dr. Alvester Morin Increase your metoprolol to a whole tablet twice daily Do you exercises I think you would benefit from physical therapy to work on your balance, think about this Do not use metamucil it will bind you up more You may use the polyethylene glycol powder twice daily to keep stools soft Keep you chin up and try to not stay in bed even if it is a bad day I do not think that you are depressed, just life events get [place] down

## 2011-01-20 NOTE — Assessment & Plan Note (Signed)
BP elevated today and repeated, will increase beta blocker to 50 mg bid.

## 2011-01-25 ENCOUNTER — Other Ambulatory Visit: Payer: Medicare Other

## 2011-01-28 ENCOUNTER — Other Ambulatory Visit: Payer: Self-pay | Admitting: Family Medicine

## 2011-01-28 ENCOUNTER — Ambulatory Visit
Admission: RE | Admit: 2011-01-28 | Discharge: 2011-01-28 | Disposition: A | Discharge status: Home / Self Care | Payer: Medicare Other | Source: Ambulatory Visit | Attending: Family Medicine | Admitting: Family Medicine

## 2011-01-28 DIAGNOSIS — Z780 Asymptomatic menopausal state: Secondary | ICD-10-CM

## 2011-01-28 DIAGNOSIS — M810 Age-related osteoporosis without current pathological fracture: Secondary | ICD-10-CM

## 2011-01-28 DIAGNOSIS — N183 Chronic kidney disease, stage 3 unspecified: Secondary | ICD-10-CM

## 2011-01-28 DIAGNOSIS — M8580 Other specified disorders of bone density and structure, unspecified site: Secondary | ICD-10-CM

## 2011-01-31 ENCOUNTER — Encounter: Payer: Self-pay | Admitting: Family Medicine

## 2011-01-31 ENCOUNTER — Ambulatory Visit (INDEPENDENT_AMBULATORY_CARE_PROVIDER_SITE_OTHER): Payer: Medicare Other | Admitting: Family Medicine

## 2011-01-31 VITALS — BP 110/71 | HR 61 | Wt 155.8 lb

## 2011-01-31 DIAGNOSIS — M949 Disorder of cartilage, unspecified: Secondary | ICD-10-CM

## 2011-01-31 DIAGNOSIS — M8580 Other specified disorders of bone density and structure, unspecified site: Secondary | ICD-10-CM

## 2011-01-31 DIAGNOSIS — I10 Essential (primary) hypertension: Secondary | ICD-10-CM

## 2011-01-31 DIAGNOSIS — R52 Pain, unspecified: Secondary | ICD-10-CM

## 2011-01-31 DIAGNOSIS — M899 Disorder of bone, unspecified: Secondary | ICD-10-CM

## 2011-01-31 DIAGNOSIS — M810 Age-related osteoporosis without current pathological fracture: Secondary | ICD-10-CM

## 2011-01-31 DIAGNOSIS — N183 Chronic kidney disease, stage 3 unspecified: Secondary | ICD-10-CM

## 2011-01-31 MED ORDER — HYDROCHLOROTHIAZIDE 12.5 MG PO CAPS: 12.5000 mg | ORAL_CAPSULE | ORAL | Status: DC

## 2011-01-31 MED ORDER — HYDROCODONE-ACETAMINOPHEN 5-500 MG PO TABS: ORAL_TABLET | ORAL | Status: DC

## 2011-01-31 MED ORDER — ALENDRONATE SODIUM 70 MG PO TABS: 70.0000 mg | ORAL_TABLET | ORAL | Status: DC

## 2011-01-31 NOTE — Patient Instructions (Signed)
It was good to meet you today I'm starting you on a new medication for your bones. Uses as directed. Come back to see me in 2 weeks to talk about your blood pressure Call with any questions,  God Bless,  Doree Albee MD

## 2011-02-02 NOTE — Progress Notes (Signed)
  Subjective:    Patient ID: Destiny Moore, female    DOB: 07/11/1932, 77 y.o.   MRN: [id number]  HPI Patient is here for a new physician visit. Major medical problems include hypertension stage III CKD.  Today patient has no acute complaints. Patient recently had a DEXA scan which showed a minus T score around 2.3. Patient is currently taking Os-Cal 600/502 tabs daily. No recent falls per patient  Hypertension: Patient's medical regimen currently includes Lopressor as well as hydrochlorothiazide. Patient is not checking blood pressures daily. Patient denies any headache chest pain or shortness of breath.  Patient states that she's been doing some exercises for back pain.    Review of Systems See history of present illness    Objective:   Physical Exam Gen: up in chair, NAD HEENT: NCAT, EOMI, TMs clear bilaterally CV: RRR, no murmurs auscultated PULM: CTAB, no wheezes, rales, rhoncii ABD: S/NT/+ bowel sounds  EXT: 2+ peripheral pulses   Assessment & Plan:

## 2011-02-08 ENCOUNTER — Other Ambulatory Visit: Payer: Self-pay | Admitting: Family Medicine

## 2011-02-08 NOTE — Telephone Encounter (Signed)
Refill request

## 2011-02-08 NOTE — Assessment & Plan Note (Signed)
Controlled today. Will follow up at next visit.

## 2011-02-08 NOTE — Assessment & Plan Note (Addendum)
Will follow up at next clinical visit. UOP unchanged. Pt refusing renal referral. Stressed avoidance of NSAIDs.

## 2011-02-08 NOTE — Assessment & Plan Note (Signed)
Added on fosamax to regimen. Continue Cal-Vit D.

## 2011-02-14 ENCOUNTER — Ambulatory Visit (INDEPENDENT_AMBULATORY_CARE_PROVIDER_SITE_OTHER): Payer: Medicare Other | Admitting: Family Medicine

## 2011-02-14 ENCOUNTER — Ambulatory Visit: Payer: Medicare Other | Admitting: Family Medicine

## 2011-02-14 DIAGNOSIS — I10 Essential (primary) hypertension: Secondary | ICD-10-CM

## 2011-02-14 DIAGNOSIS — M810 Age-related osteoporosis without current pathological fracture: Secondary | ICD-10-CM

## 2011-02-14 DIAGNOSIS — N183 Chronic kidney disease, stage 3 unspecified: Secondary | ICD-10-CM

## 2011-02-14 NOTE — Patient Instructions (Signed)
It was good to see you today Continue to take your blood pressure medications Continue taking the once weekly medication for your bones Come back to see me in 3-6 months,  Call with any other questions, God Bless,  Doree Albee MD

## 2011-02-14 NOTE — Progress Notes (Signed)
  Subjective:    Patient ID: Destiny Moore, female    DOB: Jan 09, 1933, 77 y.o.   MRN: [id number]  HPI Pt is here for general medical follow up:  HTN:Pt states that she has been taking her BP on a daily basis now. Usually running in 120s per pt. No HA, CP, SOB. Pt feels that her BPs are overall stable.   Osteoporosis: Pt recently started on fosamax in setting of T scores <-2. Pt states that she has been tolerating this medication well. No reflux type sxs or chest pain.   Review of Systems See HPI    Objective:   Physical Exam Gen: up in chair, NAD HEENT: NCAT, EOMI, TMs clear bilaterally CV: RRR, no murmurs auscultated PULM: CTAB, no wheezes, rales, rhoncii ABD: S/NT/+ bowel sounds  EXT: 2+ peripheral pulses   Assessment & Plan:

## 2011-02-15 NOTE — Assessment & Plan Note (Signed)
Tolerating fosamax well. Will continue to follow.

## 2011-02-15 NOTE — Assessment & Plan Note (Signed)
Well controlled with toprol. Will continue to follow.

## 2011-02-15 NOTE — Assessment & Plan Note (Signed)
Pt still refusing renal referral. Stressed importance of NSAID avoidance. Would like to avoid ACE or ARB unless necessary.

## 2011-02-17 ENCOUNTER — Telehealth: Payer: Self-pay | Admitting: Family Medicine

## 2011-02-17 NOTE — Telephone Encounter (Signed)
I spoke with Destiny Moore, her last colonoscopy was over 30 years ago and she does not know which GI doctor she saw. Will call [facility] to schedule appointment since they are on call this month. Told patient I will call her back once I have the appointment.Raiford Fetterman, [name]

## 2011-02-17 NOTE — Telephone Encounter (Signed)
Destiny Moore spoke with her daughter about not having a colonoscopy.  Her daughter suggested she get one and so she is asking for referral to be made for this.  Please call patient back with information when completed.

## 2011-02-22 NOTE — Telephone Encounter (Signed)
Called pt and informed of appt with Dr.Hung 03-14-11 at 10:15 am for consult of colonoscopy. Pt wrote all the info down and repeated it back to me. Pt will keep this appt, and if unable to do so she will call and r/s. Faxed info to Attn: Dr. Elnoria Howard @ [phone] Fwd. To Dr.Newton for info .Arlyss Repress

## 2011-03-18 ENCOUNTER — Other Ambulatory Visit: Payer: Self-pay | Admitting: Family Medicine

## 2011-03-18 NOTE — Telephone Encounter (Signed)
Refill request

## 2011-03-28 ENCOUNTER — Other Ambulatory Visit: Payer: Self-pay | Admitting: Family Medicine

## 2011-03-28 NOTE — Telephone Encounter (Signed)
refill request

## 2011-06-16 ENCOUNTER — Other Ambulatory Visit: Payer: Self-pay

## 2011-06-16 ENCOUNTER — Ambulatory Visit (INDEPENDENT_AMBULATORY_CARE_PROVIDER_SITE_OTHER): Payer: Medicare Other | Admitting: Family Medicine

## 2011-06-16 ENCOUNTER — Ambulatory Visit: Payer: Medicare Other

## 2011-06-16 DIAGNOSIS — J3489 Other specified disorders of nose and nasal sinuses: Secondary | ICD-10-CM

## 2011-06-16 DIAGNOSIS — R5381 Other malaise: Secondary | ICD-10-CM

## 2011-06-16 DIAGNOSIS — R5383 Other fatigue: Secondary | ICD-10-CM

## 2011-06-16 DIAGNOSIS — R51 Headache: Secondary | ICD-10-CM

## 2011-06-16 LAB — POCT CBC
Hemoglobin: 12.7 g/dL (ref 12.2–16.2)
Lymph, poc: 2 (ref 0.6–3.4)
POC MID %: 7.6 %M (ref 0–12)
Platelet Count, POC: 178 10*3/uL (ref 142–424)
RDW, POC: 13.2 %
WBC: 5.9 10*3/uL (ref 4.6–10.2)

## 2011-06-16 LAB — POCT SEDIMENTATION RATE: POCT SED RATE: 17 mm/hr (ref 0–22)

## 2011-06-16 LAB — GLUCOSE, POCT (MANUAL RESULT ENTRY): POC Glucose: 85

## 2011-06-16 MED ORDER — AMOXICILLIN 500 MG PO CAPS: 1000.0000 mg | ORAL_CAPSULE | Freq: Two times a day (BID) | ORAL | Status: AC

## 2011-06-16 NOTE — Progress Notes (Signed)
Patient Name: Destiny Moore Date of Birth: [birth year] Medical Record Number: [id number] Gender: female Date of Encounter: 06/16/2011  History of Present Illness:  Destiny Moore is a 78 y.o. very pleasant female patient who presents with the following:  Has had a severe cough for 2 weeks. " Trouble breathing" at night- had to use Vick's vapo-rub due to nasal congestion.  (Per pt trouble breathing = nasal congestion.)  Last night noted pain in her right ear and into her right frontal sinuses.  Has had "weak spells" and "about passed out" over the last several days- last occurred 2 days ago. These "weak spells" are not new; she has had them for the last several years.  Generally better if she lays down and rests. She will feel hot and sweaty with the spells.  Generally go away with 10 minutes of rest/ lying down.  Cough is now better.  Did have a fever when she had the cough.  Has coughed up phlegm.  Did have a sore throat but this is now better.  Gums feel sore so she did not put in her dentures today.    Patient Active Problem List  Diagnoses  . HYPERLIPIDEMIA  . PTSD  . ESSENTIAL HYPERTENSION  . OSTEOARTHRITIS, MULTIPLE JOINTS  . OSTEOPOROSIS  . UNSPECIFIED GENITAL PROLAPSE  . CKD (chronic kidney disease) stage 3, GFR 30-59 ml/min  . Depression  . Hypertension  . Somatic complaints, multiple  . Abdominal pain  . Constipation  . Generalized pain  . Abnormal gait   Past Medical History  Diagnosis Date  . Hypertension   . Hyperlipidemia   . Anxiety   . UTI (urinary tract infection)   . Depression   . Osteoporosis    Past Surgical History  Procedure Date  . Hystrectomy   . Bladder tact   . Abdominal hysterectomy   . Incontinence surgery    History  Substance Use Topics  . Smoking status: Never Smoker   . Smokeless tobacco: Never Used  . Alcohol Use: No   Family History  Problem Relation Age of Onset  . Heart disease Mother   . Cancer Sister     breast  . Cancer  Brother     skin, kidney, lung   Allergies  Allergen Reactions  . Coreg Anaphylaxis  . Zoloft Other (See Comments)    Could not feed herself  . Carvedilol   . Lisinopril     REACTION: Anaphylaxis    Medication list has been reviewed and updated.  Review of Systems: As per HPI, otherwise negative  Physical Examination: Filed Vitals:   06/16/11 1035  BP: 160/75  Pulse: 60  Temp: 97.7 F (36.5 C)  TempSrc: Oral  Resp: 20  Height: 5' 1.5" (1.562 m)  Weight: 152 lb 6.4 oz (69.128 kg)  SpO2: 96%    Body mass index is 28.33 kg/(m^2).  GEN: WDWN, NAD, Non-toxic, A & O x 3.  Appears older than stated age.  HEENT: Atraumatic, Normocephalic. Neck supple. No masses, No LAD.  PEERLA, EOMI, no rash Ears and Nose: No external deformity. TM wnl bilaterally, oropharynx wnl,  Frontal right sinus slightly tender to percussion.  No temporal nodules or tenderness, no rash to suggest VZ.  CV: RRR, No M/G/R. No JVD. No thrill. No extra heart sounds. PULM: CTA B, no wheezes, crackles, rhonchi. No retractions. No resp. distress. No accessory muscle use. ABD: S, NT, ND, +BS. No rebound. No HSM. EXTR: No c/c/e NEURO Normal gait.  PSYCH: Normally interactive. Conversant. Not depressed or anxious appearing.  Calm demeanor.   Recheck BP 165/90 prior to discharge home  [facility] reading (PRIMARY) by  Dr. Patsy Lager.  2 view CXR normal-  Of note the xray was entered under the name Destiny Moore DOB [birth year].  We are working on getting the name corrected.  Results for orders placed in visit on 06/16/11  POCT CBC      Component Value Range   WBC 5.9  4.6 - 10.2 (K/uL)   Lymph, poc 2.0  0.6 - 3.4    POC LYMPH PERCENT 33.8  10 - 50 (%L)   MID (cbc) 0.4  0 - 0.9    POC MID % 7.6  0 - 12 (%M)   POC Granulocyte 3.5  2 - 6.9    Granulocyte percent 58.6  37 - 80 (%G)   RBC 4.35  4.04 - 5.48 (M/uL)   Hemoglobin 12.7  12.2 - 16.2 (g/dL)   HCT, POC 21.3  08.6 - 47.9 (%)   MCV 93.1  80 - 97 (fL)   MCH, POC 29.2   27 - 31.2 (pg)   MCHC 31.4 (*) 31.8 - 35.4 (g/dL)   RDW, POC 57.8     Platelet Count, POC 178  142 - 424 (K/uL)   MPV 11.1  0 - 99.8 (fL)  GLUCOSE, POCT (MANUAL RESULT ENTRY)      Component Value Range   POC Glucose 85    POCT SEDIMENTATION RATE      Component Value Range   POCT SED RATE 17  0 - 22 (mm/hr)    Assessment and Plan: 1. Malaise and fatigue  POCT glucose (manual entry), DG Chest 2 View, DG Chest 2 View  2. Headache  POCT SEDIMENTATION RATE  3. Sinus pain  POCT CBC   78 year old female with cough which has gotten better and current sinus symptoms.  Also long history of "weak spells" but these are not new.  Await sed rate to rule out TA, but will go ahead and treat for sinusitis with amoxicillin.   Rx 500mg , 2 tablets BID for 10 days.  Encouraged rest and continuation of her other medications.  Let us know if she is not feeling better soon!  Sooner if worse.   Please note orthostatic BP- negative for orthostasis, but BP did go quite high.  Per pt and her husband she has a long history of rapidly fluctuating BP and she has been evaluated for this several times.

## 2011-07-16 ENCOUNTER — Other Ambulatory Visit: Payer: Self-pay | Admitting: Family Medicine

## 2011-07-17 NOTE — Telephone Encounter (Signed)
Refill request

## 2011-07-19 NOTE — Telephone Encounter (Signed)
Refill request-Dr. Newton on vacation 

## 2011-08-15 ENCOUNTER — Other Ambulatory Visit: Payer: Self-pay | Admitting: Family Medicine

## 2011-09-01 ENCOUNTER — Encounter: Payer: Self-pay | Admitting: Home Health Services

## 2011-09-04 ENCOUNTER — Other Ambulatory Visit: Payer: Self-pay | Admitting: Family Medicine

## 2011-09-21 ENCOUNTER — Ambulatory Visit (INDEPENDENT_AMBULATORY_CARE_PROVIDER_SITE_OTHER): Payer: Medicare Other | Admitting: Family Medicine

## 2011-09-21 ENCOUNTER — Encounter: Payer: Self-pay | Admitting: Family Medicine

## 2011-09-21 VITALS — BP 128/70 | HR 84 | Temp 98.1°F | Resp 18

## 2011-09-21 DIAGNOSIS — R5381 Other malaise: Secondary | ICD-10-CM

## 2011-09-21 DIAGNOSIS — R42 Dizziness and giddiness: Secondary | ICD-10-CM

## 2011-09-21 DIAGNOSIS — R3915 Urgency of urination: Secondary | ICD-10-CM

## 2011-09-21 DIAGNOSIS — R5383 Other fatigue: Secondary | ICD-10-CM

## 2011-09-21 LAB — POCT CBC
Granulocyte percent: 69.6 %G (ref 37–80)
HCT, POC: 38.6 % (ref 37.7–47.9)
Hemoglobin: 12.9 g/dL (ref 12.2–16.2)
Lymph, poc: 1.5 (ref 0.6–3.4)
MCH, POC: 31 pg (ref 27–31.2)
MCHC: 33.4 g/dL (ref 31.8–35.4)
MCV: 92.8 fL (ref 80–97)
MID (cbc): 0.5 (ref 0–0.9)
MPV: 11.5 fL (ref 0–99.8)
POC Granulocyte: 4.6 (ref 2–6.9)
POC LYMPH PERCENT: 23 %L (ref 10–50)
POC MID %: 7.4 %M (ref 0–12)
Platelet Count, POC: 173 10*3/uL (ref 142–424)
RBC: 4.16 M/uL (ref 4.04–5.48)
RDW, POC: 13 %
WBC: 6.6 10*3/uL (ref 4.6–10.2)

## 2011-09-21 LAB — POCT URINALYSIS DIPSTICK
Bilirubin, UA: NEGATIVE
Blood, UA: NEGATIVE
Glucose, UA: NEGATIVE
Ketones, UA: NEGATIVE
Nitrite, UA: NEGATIVE
Protein, UA: NEGATIVE
Spec Grav, UA: 1.015
Urobilinogen, UA: 1
pH, UA: 6.5

## 2011-09-21 LAB — POCT UA - MICROSCOPIC ONLY
Casts, Ur, LPF, POC: NEGATIVE
Crystals, Ur, HPF, POC: NEGATIVE
Yeast, UA: NEGATIVE

## 2011-09-21 LAB — COMPREHENSIVE METABOLIC PANEL
ALT: 16 U/L (ref 0–35)
AST: 24 U/L (ref 0–37)
Albumin: 4 g/dL (ref 3.5–5.2)
Alkaline Phosphatase: 58 U/L (ref 39–117)
BUN: 17 mg/dL (ref 6–23)
CO2: 28 mEq/L (ref 19–32)
Calcium: 9.4 mg/dL (ref 8.4–10.5)
Chloride: 108 mEq/L (ref 96–112)
Creat: 1.23 mg/dL — ABNORMAL HIGH (ref 0.50–1.10)
Glucose, Bld: 110 mg/dL — ABNORMAL HIGH (ref 70–99)
Potassium: 3.9 mEq/L (ref 3.5–5.3)
Sodium: 144 mEq/L (ref 135–145)
Total Bilirubin: 0.7 mg/dL (ref 0.3–1.2)
Total Protein: 6.6 g/dL (ref 6.0–8.3)

## 2011-09-21 LAB — POCT SEDIMENTATION RATE: POCT SED RATE: 19 mm/hr (ref 0–22)

## 2011-09-21 MED ORDER — NITROFURANTOIN MONOHYD MACRO 100 MG PO CAPS: 100.0000 mg | ORAL_CAPSULE | Freq: Two times a day (BID) | ORAL | Status: AC

## 2011-09-21 NOTE — Progress Notes (Signed)
Is a 78 year old woman with multiple medical problems comes in with 2 days of intermittent dizziness and flushing. She describes episodes that begin with a flushing and then progressed to being very lightheaded followed by diaphoresis. She her husband are here together and they report problems with blood pressure for the last year. It seems to fluctuate wildly from day-to-day and multiple medicines and tried to control this.  Patient has had vertigo in the past but says that this problem is quite different. She's having no tinnitus or ear pain. She also denies chest pain or shortness of breath. She has some mild aching in her left calf but this chronic and preceded the dizziness. She's had no falls, no nausea or vomiting, no syncope.  Objective: Alert pleasant woman in no distress. Patient was brought back urgently. HEENT: Unremarkable with normal tympanic membranes, normal oropharynx, pupils equal and reactive to light and accommodation, EOM intact Neck: Supple no adenopathy or bruits. Chest: Clear  Heart: Regular no murmur or gallop; EKG no acute changes-mild bradycardia  Abdomen: Soft nontender without HSM or masses.  Extremities: 2 cm ecchymosis left shin, otherwise unremarkable with no edema.  Neurologically: Patient alert and appropriate. Cranial 3 through 12: Intact  Extremities equally, no sensory changes Results for orders placed in visit on 09/21/11  POCT CBC      Component Value Range   WBC 6.6  4.6 - 10.2 (K/uL)   Lymph, poc 1.5  0.6 - 3.4    POC LYMPH PERCENT 23.0  10 - 50 (%L)   MID (cbc) 0.5  0 - 0.9    POC MID % 7.4  0 - 12 (%M)   POC Granulocyte 4.6  2 - 6.9    Granulocyte percent 69.6  37 - 80 (%G)   RBC 4.16  4.04 - 5.48 (M/uL)   Hemoglobin 12.9  12.2 - 16.2 (g/dL)   HCT, POC 16.1  09.6 - 47.9 (%)   MCV 92.8  80 - 97 (fL)   MCH, POC 31.0  27 - 31.2 (pg)   MCHC 33.4  31.8 - 35.4 (g/dL)   RDW, POC 04.5     Platelet Count, POC 173  142 - 424 (K/uL)   MPV 11.5  0 -  99.8 (fL)  POCT URINALYSIS DIPSTICK      Component Value Range   Color, UA yellow     Clarity, UA hazy     Glucose, UA neg     Bilirubin, UA neg     Ketones, UA neg     Spec Grav, UA 1.015     Blood, UA neg     pH, UA 6.5     Protein, UA neg     Urobilinogen, UA 1.0     Nitrite, UA neg     Leukocytes, UA Trace    POCT UA - MICROSCOPIC ONLY      Component Value Range   WBC, Ur, HPF, POC 6-8     RBC, urine, microscopic 1-3     Bacteria, U Microscopic small     Mucus, UA trace     Epithelial cells, urine per micros 2-4     Crystals, Ur, HPF, POC neg     Casts, Ur, LPF, POC neg     Yeast, UA neg       Assessment: These episodes of "dizziness" are most likely hypotensive episodes. The doxazosin that she take his well known to cause fluctuating blood pressures. For this reason I think discontinuing this  would be a good idea. Patient is also a relatively anxious woman but she cannot take benzodiazepines. At this point I think it's reasonable to hold off on any medicines just to discontinue the doxazosin and followup.  Plan: Stop the doxazosin and the VESIcare.  Start the [facility] for urinary infection  patient needs to recheck in one week

## 2011-09-21 NOTE — Patient Instructions (Signed)
You need to stop the Doxazasin blood pressure medicine.   Start the new urinary infection medication twice a day See your doctor or return here in 1 week.

## 2011-09-22 LAB — TSH: TSH: 1.52 u[IU]/mL (ref 0.350–4.500)

## 2011-09-23 LAB — URINE CULTURE: Colony Count: 80000

## 2011-09-28 ENCOUNTER — Encounter: Payer: Self-pay | Admitting: Home Health Services

## 2011-09-28 ENCOUNTER — Ambulatory Visit (INDEPENDENT_AMBULATORY_CARE_PROVIDER_SITE_OTHER): Payer: Medicare Other | Admitting: Home Health Services

## 2011-09-28 VITALS — BP 151/85 | HR 53 | Temp 97.7°F | Ht 61.0 in | Wt 154.0 lb

## 2011-09-28 DIAGNOSIS — Z0000 Encounter for general adult medical examination without abnormal findings: Secondary | ICD-10-CM

## 2011-09-28 NOTE — Progress Notes (Signed)
Patient here for annual wellness visit, patient reports: Risk Factors/Conditions needing evaluation or treatment: Pt reports get hot and sweaty while cooking.  She also reports feeling dizzy when that happens. Pt has fu appointment with PCP on 10/03/11. Home Safety: Pt lives with husband and step son in 1 story home.  Pt reports having smoke detectors. Other Information: Corrective lens: Pt wears daily corrective lens.  Pt visits eye dr. Every 2 years. Dentures: Pt has full upper and partial bottom dentures.  Pt visits dentist as needed. Memory: Pt denies memory problems. Patient's Mini Mental Score (recorded in doc. flowsheet): 24- pt has 10th grade education. Screening may not accuratly reflect cognitive impairment.  Balance/Gait:  Balance Abnormal Patient value  Sitting balance    Sit to stand    Attempts to arise    Immediate standing balance    Standing balance    Nudge    Eyes closed- Romberg x dizzy  Tandem stance x unable  Back lean x unable to lead back to anterior surfaces of shoulders are posterior to the posterior surface of the sacrum  Neck Rotation x dizzy  360 degree turn    Sitting down     Gait Abnormal Patient value  Initiation of gait    Step length-left    Step length-right    Step height-left    Step height-right    Step symmetry    Step continuity    Path deviation    Trunk movement x stiff  Walking stance x Wide step      Annual Wellness Visit Requirements Recorded Today In  Medical, family, social history Past Medical, Family, Social History Section  Current providers Care team  Current medications Medications  Wt, BP, Ht, BMI Vital signs  Tobacco, alcohol, illicit drug use History  ADL Nurse Assessment  Depression Screening Nurse Assessment  Cognitive impairment Nurse Assessment  Mini Mental Status Document Flowsheet  Fall Risk Nurse Assessment  Home Safety Progress Note  End of Life Planning (welcome visit) Social Documentation  Medicare  preventative services Progress Note  Risk factors/conditions needing evaluation/treatment Progress Note  Personalized health advice Patient Instructions, goals, letter  Diet & Exercise Social Documentation  Emergency Contact Social Documentation  Seat Belts Social Documentation  Sun exposure/protection Social Documentation    Prevention Plan:   Recommended Medicare Prevention Screenings Women over 65 Test For Frequency Date of Last- BOLD if needed  Breast Cancer 1-2 yrs 2012  Cervical Cancer 1-3 yrs NI-due to age  Colorectal Cancer 1-10 yrs Pt reported done but no record on file  Osteoporosis once 9/12  Cholesterol 5 yrs 5/12  Diabetes yearly 5/13  HIV yearly declined  Influenza Shot yearly 2012  Pneumonia Shot once Pt reported done but no record on file  Zostavax Shot once discussed

## 2011-09-28 NOTE — Patient Instructions (Signed)
1. Continue to focus on eating 3-5 fruits and vegetables a day. 2. Take your time walking to avoid falls. Discuss dizziness with Dr. Alvester Morin at next appointment. 3. Talk to your pharmacist about getting the shingles vaccine. 4. If you find a record of the day you got your pneumonia vaccine, please bring a copy to [place].  5. Considering starting your exercises again at least 3 times a week.

## 2011-10-03 ENCOUNTER — Ambulatory Visit (INDEPENDENT_AMBULATORY_CARE_PROVIDER_SITE_OTHER): Payer: Medicare Other | Admitting: Family Medicine

## 2011-10-03 ENCOUNTER — Encounter: Payer: Self-pay | Admitting: Family Medicine

## 2011-10-03 VITALS — BP 181/102 | HR 53 | Temp 97.7°F | Ht 61.0 in | Wt 156.0 lb

## 2011-10-03 DIAGNOSIS — Z79899 Other long term (current) drug therapy: Secondary | ICD-10-CM

## 2011-10-03 DIAGNOSIS — I10 Essential (primary) hypertension: Secondary | ICD-10-CM

## 2011-10-03 DIAGNOSIS — R42 Dizziness and giddiness: Secondary | ICD-10-CM | POA: Insufficient documentation

## 2011-10-03 MED ORDER — AMLODIPINE BESYLATE 5 MG PO TABS: 5.0000 mg | ORAL_TABLET | Freq: Every day | ORAL | Status: DC

## 2011-10-03 MED ORDER — METOPROLOL TARTRATE 50 MG PO TABS: 25.0000 mg | ORAL_TABLET | Freq: Two times a day (BID) | ORAL | Status: DC

## 2011-10-03 MED ORDER — SIMVASTATIN 20 MG PO TABS: 20.0000 mg | ORAL_TABLET | Freq: Every day | ORAL | Status: DC

## 2011-10-03 NOTE — Patient Instructions (Signed)
It was good to see you today I'm changing some of your medicines Cut your metoprolol dose in half You can take a quarter tablet of your cholesterol medicine (Zocor) I'm starting you on Norvasc for your blood pressures Come back to see me next week If your symptoms worsen call us or go to the emergency room Call if any questions

## 2011-10-03 NOTE — Assessment & Plan Note (Signed)
Agree with previous assessment this is likely noncardiac etiology with medications being the predominant source. We'll continue withholding Cardura as well as Vesicare. In review of medications, I suspect metoprolol may also be contributing given bradycardia. Patient did have a CBG of 110 on her most recent metabolic panel which increases concern for possible diabetes. Will check an A1c today. Patient is also noted the on a markedly high dose of simvastatin. Plan: Cut metoprolol 25 mg twice a day Check A1c Decrease simvastatin from 80 mg 20 mg a day Followup in one week for reassessment and general care.

## 2011-10-03 NOTE — Progress Notes (Signed)
  Subjective:    Patient ID: Destiny Moore, female    DOB: January 04, 1933, 78 y.o.   MRN: [id number]  HPI Patient presents today for followup of dizziness. Patient seen last week at [facility] urgent care. Lab work, EKG, as well as urinalysis was obtained. There was a working diagnosis of symptoms from medication induced and infectious causes. Patient's Cardura and Vesicare were DC'd. Patient was placed on a course of Macrobid for UTI. Urine culture grew out 80,000 Escherichia coli that was pansensitive.  Today, patient states that this is a dizziness and diaphoresis has improved though mildly persisted. Patient describes episodes as being intermittent in nature without associated chest pain, exertion,  Nausea, hemiparesis, or  confusion.  Patient denies any falls or trauma. No swelling. Patient has been compliant with medication recommendations from last visit.   Review of Systems See HPI, otherwise ROS negative.     Objective:   Physical Exam Gen: up in chair, NAD HEENT: NCAT, EOMI, TMs clear bilaterally CV: RRR, no murmurs auscultated PULM: CTAB, no wheezes, rales, rhoncii ABD: S/NT/+ bowel sounds  EXT: 2+ peripheral pulses Neuro: CN II-XII grossly intact       Assessment & Plan:

## 2011-10-24 NOTE — Progress Notes (Signed)
I have reviewed this visit and discussed with Suzanne Lineberry and agree with her documentation  

## 2011-11-07 ENCOUNTER — Other Ambulatory Visit: Payer: Self-pay | Admitting: Family Medicine

## 2011-11-12 ENCOUNTER — Other Ambulatory Visit: Payer: Self-pay | Admitting: Family Medicine

## 2012-01-03 ENCOUNTER — Other Ambulatory Visit: Payer: Self-pay | Admitting: *Deleted

## 2012-01-03 DIAGNOSIS — M8580 Other specified disorders of bone density and structure, unspecified site: Secondary | ICD-10-CM

## 2012-01-03 MED ORDER — PANTOPRAZOLE SODIUM 40 MG PO TBEC: 40.0000 mg | DELAYED_RELEASE_TABLET | Freq: Every day | ORAL | Status: DC

## 2012-01-03 MED ORDER — ALENDRONATE SODIUM 70 MG PO TABS: 70.0000 mg | ORAL_TABLET | ORAL | Status: DC

## 2012-01-06 ENCOUNTER — Encounter: Payer: Self-pay | Admitting: Cardiovascular Disease

## 2012-02-02 ENCOUNTER — Ambulatory Visit: Payer: Medicare Other

## 2012-02-02 ENCOUNTER — Ambulatory Visit (INDEPENDENT_AMBULATORY_CARE_PROVIDER_SITE_OTHER): Payer: Medicare Other | Admitting: Emergency Medicine

## 2012-02-02 VITALS — BP 160/86 | HR 56 | Temp 97.4°F | Resp 16 | Ht 61.5 in | Wt 152.8 lb

## 2012-02-02 DIAGNOSIS — M79601 Pain in right arm: Secondary | ICD-10-CM

## 2012-02-02 DIAGNOSIS — M542 Cervicalgia: Secondary | ICD-10-CM

## 2012-02-02 DIAGNOSIS — I10 Essential (primary) hypertension: Secondary | ICD-10-CM

## 2012-02-02 DIAGNOSIS — M79609 Pain in unspecified limb: Secondary | ICD-10-CM

## 2012-02-02 MED ORDER — AMLODIPINE BESYLATE 5 MG PO TABS: ORAL_TABLET | ORAL | Status: DC

## 2012-02-02 MED ORDER — DOXAZOSIN MESYLATE 1 MG PO TABS: 2.0000 mg | ORAL_TABLET | Freq: Every day | ORAL | Status: DC

## 2012-02-02 NOTE — Progress Notes (Signed)
  Subjective:    Patient ID: Destiny Moore, female    DOB: 26-Sep-1932, 78 y.o.   MRN: [id number]  HPI High blood pressure this morning. She took BP and it was 196/94.  She is also experiencing right side arm and neck pain. She states that she has no energy. Blood pressure was repeated at 11:32 with a result of 158/90 in left arm (taken manually) and 158/90 in right arm. Patient denies chest pain and shortness of breath She lives with her husband. She has a history of hypertension and has been under the care of Dr.Nahser. She is supposed to see him regularly but has not seen him for over a year she took her blood pressure because she woke up because she has had difficulty with urinary urgency and bladder pressure to be elevated at that time.   Review of Systems     Objective:   Physical Exam patient is somewhat slow to respond but alert and cooperative and oriented. There is tenderness along the right side of the neck but no focal findings in the right arm. Her chest was clear to auscultation and percussion her cardiac exam reveals a no murmurs or gallops. Extremities are without edema  EKG sinus bradycardia no acute change  [facility] reading (PRIMARY) by  Dr. Cleta Moore C5-C6 degenerative disc disease with mild arthritic change      Assessment & Plan:  We'll check C-spine films because of her neck and arm discomfort. EKG will be done even though she denies chest pain or shortness of breath or diaphoresis need to adjust her blood pressure medications to get a better blood pressure control and get her back into see Dr. Elease Moore  her since she has not seen him for over a year. We'll increase her Norvasc to 7.5 mg at

## 2012-02-02 NOTE — Patient Instructions (Addendum)
Increase her amlodipine 5 mg to 1-1/2 tablets at bedtime. Take Tylenol as needed for your neck pain

## 2012-02-12 ENCOUNTER — Ambulatory Visit: Payer: Medicare Other

## 2012-02-12 ENCOUNTER — Ambulatory Visit (INDEPENDENT_AMBULATORY_CARE_PROVIDER_SITE_OTHER): Payer: Medicare Other | Admitting: Family Medicine

## 2012-02-12 VITALS — BP 113/72 | HR 74 | Temp 100.0°F | Resp 18 | Ht 61.0 in | Wt 155.6 lb

## 2012-02-12 DIAGNOSIS — R509 Fever, unspecified: Secondary | ICD-10-CM

## 2012-02-12 DIAGNOSIS — R059 Cough, unspecified: Secondary | ICD-10-CM

## 2012-02-12 DIAGNOSIS — J329 Chronic sinusitis, unspecified: Secondary | ICD-10-CM

## 2012-02-12 DIAGNOSIS — R05 Cough: Secondary | ICD-10-CM

## 2012-02-12 DIAGNOSIS — J029 Acute pharyngitis, unspecified: Secondary | ICD-10-CM

## 2012-02-12 LAB — POCT CBC
Granulocyte percent: 74.3 %G (ref 37–80)
HCT, POC: 41.4 % (ref 37.7–47.9)
Hemoglobin: 12.7 g/dL (ref 12.2–16.2)
Lymph, poc: 1.1 (ref 0.6–3.4)
MCH, POC: 29.7 pg (ref 27–31.2)
MCHC: 30.7 g/dL — AB (ref 31.8–35.4)
MCV: 96.8 fL (ref 80–97)
MID (cbc): 0.4 (ref 0–0.9)
MPV: 10.5 fL (ref 0–99.8)
POC Granulocyte: 4.4 (ref 2–6.9)
POC LYMPH PERCENT: 18.8 % (ref 10–50)
POC MID %: 6.9 % (ref 0–12)
Platelet Count, POC: 179 10*3/uL (ref 142–424)
RBC: 4.28 M/uL (ref 4.04–5.48)
RDW, POC: 13.7 %
WBC: 5.9 10*3/uL (ref 4.6–10.2)

## 2012-02-12 LAB — POCT RAPID STREP A (OFFICE): Rapid Strep A Screen: NEGATIVE

## 2012-02-12 MED ORDER — BENZONATATE 100 MG PO CAPS: 100.0000 mg | ORAL_CAPSULE | Freq: Two times a day (BID) | ORAL | Status: DC | PRN

## 2012-02-12 MED ORDER — HYDROCODONE-HOMATROPINE 5-1.5 MG/5ML PO SYRP: 5.0000 mL | ORAL_SOLUTION | Freq: Every evening | ORAL | Status: DC | PRN

## 2012-02-12 MED ORDER — AZITHROMYCIN 250 MG PO TABS: ORAL_TABLET | ORAL | Status: DC

## 2012-02-12 NOTE — Progress Notes (Signed)
[facility]:  Office Visit  Chief Complaint:  Chief Complaint  Patient presents with  . Cough  . Sore Throat  . Fever    HPI: Destiny Moore is a 78 y.o. female who complains of   Fever, chest congestion, sinus pressure, productive green sputum x 1 week. Husband was sick. She has had her flu vaccine in September. She feels weak. Denies dysuria, she ahs a ho overactive bladder. Has not tried anythign for fever.   Past Medical History  Diagnosis Date  . Hypertension   . Hyperlipidemia   . Anxiety   . UTI (urinary tract infection)   . Depression   . Osteoporosis    Past Surgical History  Procedure Date  . Hystrectomy   . Bladder tact   . Abdominal hysterectomy   . Incontinence surgery    History   Social History  . Marital Status: Married    Spouse Name: GENNAVIEVE HUQ    Number of Children: 2  . Years of Education: 10   Occupational History  . Retired- Emergency planning/management officer    Social History Main Topics  . Smoking status: Never Smoker   . Smokeless tobacco: Never Used  . Alcohol Use: No  . Drug Use: No  . Sexually Active: None   Other Topics Concern  . None   Social History Narrative   Health Care POA: Emergency Contact: Garnette Gunner [phone] (h) End of Life Plan: Who lives with you: Lives with husband,  step son Payson Crumby pets: 4 catsDiet: Patient has a varied diet of protein, starch, and vegetables. Exercise: Patient does not have any regular exercise plan.  Does not like to exercise by her self.Seatbelts: Patient reports wearing seat belt when in vehicle.Sun Exposure/Protection: Patient does not wear sun protection.Hobbies: cooking, traveling, visiting family in [place]    Family History  Problem Relation Age of Onset  . Heart disease Mother   . Cancer Sister     breast  . Cancer Brother     skin, kidney, lung   Allergies  Allergen Reactions  . Coreg Anaphylaxis  . Sertraline Hcl Other (See Comments)    Could not  feed herself  . Carvedilol   . Lisinopril     REACTION: Anaphylaxis   Prior to Admission medications   Medication Sig Start Date End Date Taking? Authorizing Provider  alendronate (FOSAMAX) 70 MG tablet Take 1 tablet (70 mg total) by mouth every 7 (seven) days. Take with a full glass of water on an empty stomach. 01/03/12 01/02/13 Yes Everrett Coombe, DO  amLODipine (NORVASC) 5 MG tablet Take 1-1/2 tablets at bedtime 02/02/12  Yes Collene Gobble, MD  aspirin 81 MG tablet Take 81 mg by mouth daily.   Yes Historical Provider, MD  B Complex-C (B-COMPLEX WITH VITAMIN C) tablet Take 1 tablet by mouth daily.   Yes Historical Provider, MD  calcium carbonate (OS-CAL) 600 MG TABS Take 600 mg by mouth daily. Takes 500 mg with 200mg  vitamin D   Yes Historical Provider, MD  cyanocobalamin 500 MCG tablet Take 500 mcg by mouth daily.     Yes Historical Provider, MD  EPINEPHrine (EPIPEN) 0.3 mg/0.3 mL DEVI Inject 0.3 mg into the muscle as directed.     Yes Historical Provider, MD  hydrochlorothiazide (MICROZIDE) 12.5 MG capsule TAKE ONE CAPSULE BY MOUTH EVERY MORNING 11/07/11  Yes Everrett Coombe, DO  HYDROcodone-acetaminophen (VICODIN) 5-500 MG per tablet 1/2 tab once in a while when  pain is bad 01/31/11  Yes Doree Albee, MD  KLOR-CON M10 10 MEQ tablet TAKE 2 TABLETS BY MOUTH EVERY DAY 11/07/11  Yes Everrett Coombe, DO  metoprolol (LOPRESSOR) 50 MG tablet Take 0.5 tablets (25 mg total) by mouth 2 (two) times daily. 10/03/11  Yes Doree Albee, MD  pantoprazole (PROTONIX) 40 MG tablet Take 1 tablet (40 mg total) by mouth daily. 01/03/12  Yes Everrett Coombe, DO  polyethylene glycol powder (GLYCOLAX) powder Take 17 g by mouth daily. 01/20/11  Yes Mayo Ao, NP  simvastatin (ZOCOR) 20 MG tablet Take 1 tablet (20 mg total) by mouth at bedtime. 10/03/11  Yes Doree Albee, MD  traMADol Janean Sark) 50 MG tablet  12/16/10  Yes Historical Provider, MD     ROS: The patient denies  night sweats, unintentional weight loss, chest pain,  palpitations, wheezing, dyspnea on exertion, nausea, vomiting, abdominal pain, dysuria, hematuria, melena, numbness, weakness, or tingling.   All other systems have been reviewed and were otherwise negative with the exception of those mentioned in the HPI and as above.    PHYSICAL EXAM: Filed Vitals:   02/12/12 1022  BP: 113/72  Pulse: 74  Temp: 100 F (37.8 C)  Resp: 18   Filed Vitals:   02/12/12 1022  Height: 5\' 1"  (1.549 m)  Weight: 155 lb 9.6 oz (70.58 kg)   Body mass index is 29.40 kg/(m^2).  General: Alert, no acute distress HEENT:  Normocephalic, atraumatic, oropharynx patent. TM nl, no exudates.+ sinus tenderness Cardiovascular:  Regular rate and rhythm, no rubs murmurs or gallops.  No Carotid bruits, radial pulse intact. No pedal edema.  Respiratory: Clear to auscultation bilaterally.  No wheezes, rales, or rhonchi.  No cyanosis, no use of accessory musculature GI: No organomegaly, abdomen is soft and non-tender, positive bowel sounds.  No masses. Skin: No rashes. Neurologic: Facial musculature symmetric. Psychiatric: Patient is appropriate throughout our interaction. Lymphatic: No cervical lymphadenopathy Musculoskeletal: Gait intact.   LABS: Results for orders placed in visit on 02/12/12  POCT CBC      Component Value Range   WBC 5.9  4.6 - 10.2 K/uL   Lymph, poc 1.1  0.6 - 3.4   POC LYMPH PERCENT 18.8  10 - 50 %L   MID (cbc) 0.4  0 - 0.9   POC MID % 6.9  0 - 12 %M   POC Granulocyte 4.4  2 - 6.9   Granulocyte percent 74.3  37 - 80 %G   RBC 4.28  4.04 - 5.48 M/uL   Hemoglobin 12.7  12.2 - 16.2 g/dL   HCT, POC 29.5  28.4 - 47.9 %   MCV 96.8  80 - 97 fL   MCH, POC 29.7  27 - 31.2 pg   MCHC 30.7 (*) 31.8 - 35.4 g/dL   RDW, POC 13.2     Platelet Count, POC 179  142 - 424 K/uL   MPV 10.5  0 - 99.8 fL  POCT RAPID STREP A (OFFICE)      Component Value Range   Rapid Strep A Screen Negative  Negative     EKG/XRAY:   Primary read interpreted [facility] Con[facility]s at  [facility]. No acute cardiopulmonary process   ASSESSMENT/PLAN: Encounter Diagnoses  Name Primary?  . Fever Yes  . Pharyngitis   . Sinusitis   . Cough    Rx Tylenol and Motrin Rx Azithromycin Rx Tessalon Perles and Hydromet syrup    LE, THAO PHUONG, DO 02/12/2012 11:26 AM

## 2012-02-24 ENCOUNTER — Ambulatory Visit: Payer: Medicare Other | Admitting: Cardiovascular Disease

## 2012-03-02 ENCOUNTER — Encounter: Payer: Self-pay | Admitting: Cardiovascular Disease

## 2012-03-05 ENCOUNTER — Ambulatory Visit (INDEPENDENT_AMBULATORY_CARE_PROVIDER_SITE_OTHER): Payer: Medicare Other | Admitting: Family Medicine

## 2012-03-05 ENCOUNTER — Encounter: Payer: Self-pay | Admitting: Family Medicine

## 2012-03-05 VITALS — BP 148/85 | HR 64 | Temp 98.1°F | Ht 61.0 in | Wt 152.8 lb

## 2012-03-05 DIAGNOSIS — R42 Dizziness and giddiness: Secondary | ICD-10-CM

## 2012-03-05 NOTE — Progress Notes (Signed)
Subjective:     Patient ID: Destiny Moore, female   DOB: Sep 13, 1932, 78 y.o.   MRN: [id number]  HPI 78 yo F presents with one day of dizziness and generalized weakness. Symptoms started this AM. Symptoms have improved throoughout the day. She has bouts of generalized weakness and dizziness for years. She denies HA, blurred vision, CP, SOB. She has vicodin for chronic low back and shoulder pain buts has not taken this in many weeks.   Review of Systems As per HPI  She admits to neuropathy in lower extremities    Objective:   Physical Exam BP 148/85  Pulse 64  Temp 98.1 F (36.7 C) (Oral)  Ht 5\' 1"  (1.549 m)  Wt 152 lb 12.8 oz (69.31 kg)  BMI 28.87 kg/m2  SpO2 99% Orthostatic vital signs: negative  General appearance: alert, cooperative, appears stated age and no distress Head: Normocephalic, without obvious abnormality, atraumatic Eyes: conjunctivae/corneas clear. PERRL, EOM's intact. Throat: lips, mucosa, and tongue normal; teeth and gums normal Neck: no adenopathy, no carotid bruit, no JVD, supple, symmetrical, trachea midline and thyroid not enlarged, symmetric, no tenderness/mass/nodules Breasts: normal appearance, no masses or tenderness Heart: regular rate and rhythm, S1, S2 normal, no murmur, click, rub or gallop Extremities: extremities normal, atraumatic, no cyanosis or edema Pulses: 2+ and symmetric Neurologic: Grossly normal  Lab Results  Component Value Date   WBC 5.9 02/12/2012   HGB 12.7 02/12/2012   HCT 41.4 02/12/2012   MCV 96.8 02/12/2012   PLT 196 01/11/2011        Assessment and Plan:

## 2012-03-05 NOTE — Assessment & Plan Note (Signed)
A: this is a chronic problem per patient and notes. Attributed to polypharmacy.  P:  Check electrolytes, BMP.  Check B12 given dizziness and neuropathy  I agree with removing unnecessary medications, will leave to discretion of PCP. Recommend removing vicodin since patient is not really taking it. Recommend removing BB since it is not a good BP medication, she has low/normal HR and no compelling reason to be on it, like heart failure.

## 2012-03-05 NOTE — Patient Instructions (Addendum)
Mrs. Destiny Moore,  Thank you for coming in to see me today. Make sure you drink plenty of fluids.   I would like to check B12 and BMP today.  Please schedule a visit to meet your new MD, Dr. Ashley Royalty.   Dr. Armen Pickup

## 2012-03-08 ENCOUNTER — Telehealth: Payer: Self-pay | Admitting: Family Medicine

## 2012-03-08 NOTE — Telephone Encounter (Signed)
Called patient. B12 normal. BNP ordered instead of a BMP. Normal BNP. I encouraged patient to f/u with PCP to have BMP checked (check potassium).  Patient voiced understanding and had no questions.

## 2012-03-22 ENCOUNTER — Other Ambulatory Visit: Payer: Self-pay | Admitting: *Deleted

## 2012-03-23 ENCOUNTER — Encounter: Payer: Self-pay | Admitting: *Deleted

## 2012-03-23 MED ORDER — POTASSIUM CHLORIDE CRYS ER 10 MEQ PO TBCR: 20.0000 meq | EXTENDED_RELEASE_TABLET | Freq: Every day | ORAL | Status: DC

## 2012-03-23 NOTE — Telephone Encounter (Signed)
This encounter was created in error - please disregard.

## 2012-03-26 ENCOUNTER — Ambulatory Visit (INDEPENDENT_AMBULATORY_CARE_PROVIDER_SITE_OTHER): Payer: Medicare Other | Admitting: Cardiovascular Disease

## 2012-03-26 ENCOUNTER — Encounter: Payer: Self-pay | Admitting: Cardiovascular Disease

## 2012-03-26 VITALS — BP 145/82 | HR 60 | Ht 61.0 in | Wt 156.0 lb

## 2012-03-26 DIAGNOSIS — I10 Essential (primary) hypertension: Secondary | ICD-10-CM

## 2012-03-26 NOTE — Progress Notes (Signed)
[name] Date of Birth  [birth year]       [facility]    [place] [address]  [address], [address] [place], [place]  [address]   [place], [place]  [address] [phone]     [phone]   Fax  [phone]    Fax [phone]  Problem List: 1. Hypertension 2. hyperlipidemia 3. Mild PVD - mild right carotid stenosis 4. Chest pain - normal stress myoview in 2001  History of Present Illness:  Destiny Moore is a 78 yo with hx of CP in the past.  She   Current Outpatient Prescriptions on File Prior to Visit  Medication Sig Dispense Refill  . alendronate (FOSAMAX) 70 MG tablet Take 1 tablet (70 mg total) by mouth every 7 (seven) days. Take with a full glass of water on an empty stomach.  4 tablet  11  . amLODipine (NORVASC) 5 MG tablet Take 1-1/2 tablets at bedtime  30 tablet  11  . aspirin 81 MG tablet Take 81 mg by mouth daily.      . B Complex-C (B-COMPLEX WITH VITAMIN C) tablet Take 1 tablet by mouth daily.      . calcium carbonate (OS-CAL) 600 MG TABS Take 600 mg by mouth daily. Takes 500 mg with 200mg  vitamin D      . cyanocobalamin 500 MCG tablet Take 500 mcg by mouth daily.        Marland Kitchen EPINEPHrine (EPIPEN) 0.3 mg/0.3 mL DEVI Inject 0.3 mg into the muscle as directed.        . hydrochlorothiazide (MICROZIDE) 12.5 MG capsule TAKE ONE CAPSULE BY MOUTH EVERY MORNING  30 capsule  6  . HYDROcodone-acetaminophen (VICODIN) 5-500 MG per tablet Take 1 tablet by mouth every 6 (six) hours as needed.      . metoprolol (LOPRESSOR) 50 MG tablet Take 0.5 tablets (25 mg total) by mouth 2 (two) times daily.  60 tablet  6  . pantoprazole (PROTONIX) 40 MG tablet Take 1 tablet (40 mg total) by mouth daily.  30 tablet  3  . potassium chloride (KLOR-CON M10) 10 MEQ tablet Take 2 tablets (20 mEq total) by mouth daily.  60 tablet  0  . simvastatin (ZOCOR) 20 MG tablet Take 1 tablet (20 mg total) by mouth at bedtime.  30 tablet  6  . traMADol (ULTRAM) 50 MG tablet          Allergies  Allergen Reactions  . Coreg Anaphylaxis  . Sertraline Hcl Other (See Comments)    Could not feed herself  . Carvedilol   . Lisinopril     REACTION: Anaphylaxis    Past Medical History  Diagnosis Date  . Hypertension   . Hyperlipidemia   . Anxiety   . UTI (urinary tract infection)   . Depression   . Osteoporosis     Past Surgical History  Procedure Date  . Hystrectomy   . Bladder tact   . Abdominal hysterectomy   . Incontinence surgery     History  Smoking status  . Never Smoker   Smokeless tobacco  . Never Used    History  Alcohol Use No    Family History  Problem Relation Age of Onset  . Heart disease Mother   . Cancer Sister     breast  . Cancer Brother     skin, kidney, lung    Reviw of Systems:  Reviewed in the HPI.  All other systems are  negative.  Physical Exam: Blood pressure 160/84, height 5\' 1"  (1.549 m), weight 155 lb 12.8 oz (70.67 kg). General: Well developed, well nourished, in no acute distress.  Head: Normocephalic, atraumatic, sclera non-icteric, mucus membranes are moist,   Neck: Supple. Carotids are 2 + without bruits. No JVD   Lungs: Clear   Heart: RR, normal S1, S2  Abdomen: Soft, non-tender, non-distended with normal bowel sounds.  Msk:  Strength and tone are normal  Extremities: No clubbing or cyanosis. No edema.  Distal pedal pulses are 2+ and equal    Neuro: CN II - XII intact.  Alert and oriented X 3.   Psych:  Normal   ECG:  Assessment / Plan:

## 2012-03-26 NOTE — Assessment & Plan Note (Signed)
Destiny Moore is doing well. Her blood pressure is a little elevated today. She admits to eating a little bit of extra salt. She's popcorn almost every night. I've asked her to cut down on her intake of popcorn.  She's not seen me in several years. I don't think that she needs to see me on regular basis. She will continue to see her doctors in the family practice office.

## 2012-03-26 NOTE — Patient Instructions (Addendum)
Your physician recommends that you schedule a follow-up appointment in: as needed basis  

## 2012-04-16 ENCOUNTER — Other Ambulatory Visit: Payer: Self-pay | Admitting: *Deleted

## 2012-04-17 MED ORDER — SIMVASTATIN 20 MG PO TABS: 20.0000 mg | ORAL_TABLET | Freq: Every day | ORAL | Status: DC

## 2012-05-03 ENCOUNTER — Other Ambulatory Visit: Payer: Self-pay | Admitting: Family Medicine

## 2012-05-12 ENCOUNTER — Encounter (HOSPITAL_COMMUNITY): Payer: Self-pay | Admitting: Nurse Practitioner

## 2012-05-12 ENCOUNTER — Emergency Department (HOSPITAL_COMMUNITY): Payer: Medicare Other

## 2012-05-12 ENCOUNTER — Observation Stay (HOSPITAL_COMMUNITY)
Admission: EM | Admit: 2012-05-12 | Discharge: 2012-05-13 | Disposition: A | Discharge status: Home / Self Care | Payer: Medicare Other | Attending: Family Medicine | Admitting: Family Medicine

## 2012-05-12 DIAGNOSIS — M8580 Other specified disorders of bone density and structure, unspecified site: Secondary | ICD-10-CM

## 2012-05-12 DIAGNOSIS — N183 Chronic kidney disease, stage 3 unspecified: Secondary | ICD-10-CM | POA: Insufficient documentation

## 2012-05-12 DIAGNOSIS — K219 Gastro-esophageal reflux disease without esophagitis: Secondary | ICD-10-CM | POA: Insufficient documentation

## 2012-05-12 DIAGNOSIS — N179 Acute kidney failure, unspecified: Secondary | ICD-10-CM | POA: Insufficient documentation

## 2012-05-12 DIAGNOSIS — E860 Dehydration: Principal | ICD-10-CM | POA: Insufficient documentation

## 2012-05-12 DIAGNOSIS — I10 Essential (primary) hypertension: Secondary | ICD-10-CM

## 2012-05-12 DIAGNOSIS — R109 Unspecified abdominal pain: Secondary | ICD-10-CM | POA: Insufficient documentation

## 2012-05-12 DIAGNOSIS — E785 Hyperlipidemia, unspecified: Secondary | ICD-10-CM | POA: Insufficient documentation

## 2012-05-12 DIAGNOSIS — E876 Hypokalemia: Secondary | ICD-10-CM | POA: Diagnosis present

## 2012-05-12 DIAGNOSIS — I129 Hypertensive chronic kidney disease with stage 1 through stage 4 chronic kidney disease, or unspecified chronic kidney disease: Secondary | ICD-10-CM | POA: Insufficient documentation

## 2012-05-12 DIAGNOSIS — N390 Urinary tract infection, site not specified: Secondary | ICD-10-CM | POA: Insufficient documentation

## 2012-05-12 DIAGNOSIS — R197 Diarrhea, unspecified: Secondary | ICD-10-CM | POA: Diagnosis present

## 2012-05-12 LAB — COMPREHENSIVE METABOLIC PANEL
ALT: 18 U/L (ref 0–35)
Albumin: 3.7 g/dL (ref 3.5–5.2)
Alkaline Phosphatase: 60 U/L (ref 39–117)
BUN: 18 mg/dL (ref 6–23)
Chloride: 98 mEq/L (ref 96–112)
GFR calc Af Amer: 34 mL/min — ABNORMAL LOW (ref >90)
Glucose, Bld: 132 mg/dL — ABNORMAL HIGH (ref 70–99)
Potassium: 2.8 mEq/L — ABNORMAL LOW (ref 3.5–5.1)
Sodium: 135 mEq/L (ref 135–145)
Total Bilirubin: 0.4 mg/dL (ref 0.3–1.2)
Total Protein: 7.2 g/dL (ref 6.0–8.3)

## 2012-05-12 LAB — CBC WITH DIFFERENTIAL/PLATELET
Eosinophils Absolute: 0 10*3/uL (ref 0.0–0.7)
Hemoglobin: 14.1 g/dL (ref 12.0–15.0)
Lymphs Abs: 1.2 10*3/uL (ref 0.7–4.0)
Monocytes Relative: 6 % (ref 3–12)
Neutro Abs: 6.8 10*3/uL (ref 1.7–7.7)
Neutrophils Relative %: 79 % — ABNORMAL HIGH (ref 43–77)
Platelets: 194 10*3/uL (ref 150–400)
RBC: 4.5 MIL/uL (ref 3.87–5.11)
WBC: 8.6 10*3/uL (ref 4.0–10.5)

## 2012-05-12 LAB — URINALYSIS, MICROSCOPIC ONLY
Specific Gravity, Urine: 1.003 — ABNORMAL LOW (ref 1.005–1.030)
Urobilinogen, UA: 0.2 mg/dL (ref 0.0–1.0)

## 2012-05-12 MED ORDER — ASPIRIN EC 81 MG PO TBEC
81.0000 mg | DELAYED_RELEASE_TABLET | Freq: Every day | ORAL | Status: DC
  Administered 2012-05-13: 81 mg via ORAL
  Filled 2012-05-12 (×2): qty 1

## 2012-05-12 MED ORDER — ALENDRONATE SODIUM 70 MG PO TABS: 70.0000 mg | ORAL_TABLET | ORAL | Status: DC

## 2012-05-12 MED ORDER — SODIUM CHLORIDE 0.9 % IV SOLN
INTRAVENOUS | Status: DC
  Administered 2012-05-12 – 2012-05-13 (×2): via INTRAVENOUS

## 2012-05-12 MED ORDER — IOHEXOL 300 MG/ML  SOLN
25.0000 mL | INTRAMUSCULAR | Status: AC
  Administered 2012-05-12 (×2): 25 mL via ORAL

## 2012-05-12 MED ORDER — POTASSIUM CHLORIDE CRYS ER 20 MEQ PO TBCR
40.0000 meq | EXTENDED_RELEASE_TABLET | Freq: Once | ORAL | Status: AC
  Administered 2012-05-12: 40 meq via ORAL
  Filled 2012-05-12: qty 2

## 2012-05-12 MED ORDER — METOPROLOL TARTRATE 25 MG PO TABS
25.0000 mg | ORAL_TABLET | Freq: Two times a day (BID) | ORAL | Status: DC
  Administered 2012-05-12 – 2012-05-13 (×2): 25 mg via ORAL
  Filled 2012-05-12 (×3): qty 1

## 2012-05-12 MED ORDER — ONDANSETRON HCL 4 MG PO TABS: 4.0000 mg | ORAL_TABLET | Freq: Four times a day (QID) | ORAL | Status: DC | PRN

## 2012-05-12 MED ORDER — PANTOPRAZOLE SODIUM 40 MG PO TBEC
40.0000 mg | DELAYED_RELEASE_TABLET | Freq: Every day | ORAL | Status: DC
  Administered 2012-05-13: 40 mg via ORAL
  Filled 2012-05-12: qty 1

## 2012-05-12 MED ORDER — DEXTROSE 5 % IV SOLN: 2.0000 g | INTRAVENOUS | Status: DC

## 2012-05-12 MED ORDER — HEPARIN SODIUM (PORCINE) 5000 UNIT/ML IJ SOLN
5000.0000 [IU] | Freq: Three times a day (TID) | INTRAMUSCULAR | Status: DC
  Administered 2012-05-12 – 2012-05-13 (×2): 5000 [IU] via SUBCUTANEOUS
  Filled 2012-05-12 (×5): qty 1

## 2012-05-12 MED ORDER — AMLODIPINE BESYLATE 5 MG PO TABS: 7.5000 mg | ORAL_TABLET | Freq: Every day | ORAL | Status: DC

## 2012-05-12 MED ORDER — AMLODIPINE BESYLATE 5 MG PO TABS: 5.0000 mg | ORAL_TABLET | Freq: Every day | ORAL | Status: DC

## 2012-05-12 MED ORDER — AMLODIPINE BESYLATE 5 MG PO TABS
7.5000 mg | ORAL_TABLET | Freq: Every day | ORAL | Status: DC
  Administered 2012-05-12 – 2012-05-13 (×2): 7.5 mg via ORAL
  Filled 2012-05-12 (×2): qty 1

## 2012-05-12 MED ORDER — ONDANSETRON HCL 4 MG/2ML IJ SOLN
4.0000 mg | Freq: Once | INTRAMUSCULAR | Status: AC
  Administered 2012-05-12: 4 mg via INTRAVENOUS
  Filled 2012-05-12: qty 2

## 2012-05-12 MED ORDER — POTASSIUM CHLORIDE CRYS ER 20 MEQ PO TBCR: 40.0000 meq | EXTENDED_RELEASE_TABLET | Freq: Once | ORAL | Status: DC

## 2012-05-12 MED ORDER — HYDROCODONE-ACETAMINOPHEN 5-325 MG PO TABS: 1.0000 | ORAL_TABLET | ORAL | Status: DC | PRN

## 2012-05-12 MED ORDER — SODIUM CHLORIDE 0.9 % IV SOLN
1000.0000 mL | Freq: Once | INTRAVENOUS | Status: AC
  Administered 2012-05-12: 1000 mL via INTRAVENOUS

## 2012-05-12 MED ORDER — SIMVASTATIN 20 MG PO TABS
20.0000 mg | ORAL_TABLET | Freq: Every day | ORAL | Status: DC
  Administered 2012-05-12: 20 mg via ORAL
  Filled 2012-05-12 (×2): qty 1

## 2012-05-12 MED ORDER — ACETAMINOPHEN 650 MG RE SUPP: 650.0000 mg | Freq: Four times a day (QID) | RECTAL | Status: DC | PRN

## 2012-05-12 MED ORDER — POTASSIUM CHLORIDE CRYS ER 20 MEQ PO TBCR
20.0000 meq | EXTENDED_RELEASE_TABLET | Freq: Once | ORAL | Status: AC
  Administered 2012-05-12: 20 meq via ORAL
  Filled 2012-05-12: qty 1

## 2012-05-12 MED ORDER — ACETAMINOPHEN 325 MG PO TABS: 650.0000 mg | ORAL_TABLET | Freq: Four times a day (QID) | ORAL | Status: DC | PRN

## 2012-05-12 MED ORDER — ASPIRIN 81 MG PO TABS: 81.0000 mg | ORAL_TABLET | Freq: Every day | ORAL | Status: DC

## 2012-05-12 MED ORDER — DEXTROSE 5 % IV SOLN
1.0000 g | INTRAVENOUS | Status: DC
  Administered 2012-05-12: 1 g via INTRAVENOUS
  Filled 2012-05-12: qty 10

## 2012-05-12 MED ORDER — ONDANSETRON HCL 4 MG/2ML IJ SOLN: 4.0000 mg | Freq: Four times a day (QID) | INTRAMUSCULAR | Status: DC | PRN

## 2012-05-12 MED ORDER — HYDROCHLOROTHIAZIDE 12.5 MG PO CAPS
12.5000 mg | ORAL_CAPSULE | Freq: Every day | ORAL | Status: DC
  Administered 2012-05-13: 12.5 mg via ORAL
  Filled 2012-05-12 (×2): qty 1

## 2012-05-12 MED ORDER — POTASSIUM CHLORIDE 10 MEQ/100ML IV SOLN
10.0000 meq | INTRAVENOUS | Status: AC
  Administered 2012-05-12 (×3): 10 meq via INTRAVENOUS
  Filled 2012-05-12 (×3): qty 100

## 2012-05-12 MED ORDER — MORPHINE SULFATE 4 MG/ML IJ SOLN
2.0000 mg | Freq: Once | INTRAMUSCULAR | Status: AC
  Administered 2012-05-12: 2 mg via INTRAVENOUS
  Filled 2012-05-12: qty 1

## 2012-05-12 NOTE — ED Notes (Signed)
Patient transported to Ultrasound 

## 2012-05-12 NOTE — ED Notes (Signed)
Pt returned from CT °

## 2012-05-12 NOTE — H&P (Signed)
Family Medicine Teaching [facility] Admission History and Physical Service Pager: [phone]  Patient name: Destiny Moore Medical record number: [id number] Date of birth: 11-Aug-1932 Age: 79 y.o. Gender: female  Primary Care Provider: MATTHEWS,CODY, DO  Chief Complaint: Diarrhea, weakness  Assessment and Plan: Destiny Moore is a 79 y.o. year old female with a PMH of HTN, HLD, Depression and CKD stage 3 who presents with complaints of diarrhea, generalized weakness, and abdominal discomfort. Patient was afebrile in the ED with no leukocytosis and stable vital signs.  CT scan of the abdomen was obtained and revealed moderate distension of the gallbladder, without definite findings to suggest acute cholecystitis, and common bile duct dilation (13mm) with no obvious stone. LFT's and lipase were normal.   BMP revealed elevated creatinine of 1.63 (patient's baseline appears to be 1.2-1.4). Given CT findings and evidence of AKI we were called to admit for observation.  1) Abdominal pain/discomfort and Diarrhea - Will admit for observation - CT findings not suggestive of Acute Cholecystitis.  No need for surgical evaluation at this time. - Will control pain via Hydrocodone 5-325 mg Q4H PRN. Zofran PRN nausea - Will obtain C diff PCR - Will consider stool studies. However, does not appear infectious in nature. - Also will check FOBT  2) Acute on Chronic Kidney Disease - patient has underlying stage 3 CKD with baseline creatinine of 1.4. - Creatinine 1.63 on admission likely secondary to volume depletion from diarrhea. - Patient received 1 L NS bolus in the ED. Will continue IVF: NS @ 100 mL/hr  3) questionable UTI - On ROS, patient reported some dysuria. - obtain urine gram stain - follow up Urine culture and hold on empiric coverage for now  4) Hypokalemia: likely from diarrhea - received 3 IV runs of potassium in ED - KDur x1 - repeat BMP in am  5) Incidental findings of  right hepatic lobe lesions on CT and [place]. - consider repeat US as outpatient.   6) HTN - Will continue home Norvasc, Lopressor, and HCTZ, ASA  7) HLD - Will continue Zocor 20 mg  8) GERD  - Protonix 40 mg daily  FEN/GI: Regular diet Prophylaxis: Heparin SQ Disposition: Pending clinical improvement  History of Present Illness:  Destiny Moore is a 79 y.o. year old female with a PMH of HTN, HLD, Depression and CKD stage 3 who presents with complaints of diarrhea and generalized weakness.  Patient reports that Wed (1/8) she developed fever and diarrhea.  Diarrhea described as dark/black with no obvious blood and pudding-like consistency. Bowel movements occur multiple times daily.  She reports some associated abdominal discomfort and feeling of generalize fatigue. Her fever subsequently resolved spontaneously over the next few days.  However, her diarrhea continued to persist prompting her to seek treatment in the ED.  ROS: No nausea or vomiting. No sick contacts.  No reports of recent antibiotics.  No travel or different/unusual foods.  Some reported dizziness/lightheadedness.  Good liquid intake at home.  Has also been able to tolerate solid foods. Reports dysuria and polyuria.    Patient Active Problem List  Diagnosis  . HYPERLIPIDEMIA  . PTSD  . ESSENTIAL HYPERTENSION  . OSTEOARTHRITIS, MULTIPLE JOINTS  . OSTEOPOROSIS  . UNSPECIFIED GENITAL PROLAPSE  . CKD (chronic kidney disease) stage 3, GFR 30-59 ml/min  . Depression  . Hypertension  . Somatic complaints, multiple  . Constipation  . Generalized pain  . Abnormal gait  . Dizziness   Past Medical  History: Past Medical History  Diagnosis Date  . Hypertension   . Hyperlipidemia   . Anxiety   . UTI (urinary tract infection)   . Depression   . Osteoporosis    Past Surgical History: Past Surgical History  Procedure Date  . Hystrectomy   . Bladder tact   . Abdominal hysterectomy   . Incontinence surgery    Social  History: History  Substance Use Topics  . Smoking status: Never Smoker   . Smokeless tobacco: Never Used  . Alcohol Use: No     Family History: Family History  Problem Relation Age of Onset  . Heart disease Mother   . Cancer Sister     breast  . Cancer Brother     skin, kidney, lung   Allergies: Allergies  Allergen Reactions  . Coreg Anaphylaxis  . Lisinopril Anaphylaxis    REACTION: Anaphylaxis  . Sertraline Hcl Other (See Comments)    Could not feed herself  . Carvedilol Other (See Comments)    Unknown    No current facility-administered medications on file prior to encounter.   Current Outpatient Prescriptions on File Prior to Encounter  Medication Sig Dispense Refill  . amLODipine (NORVASC) 5 MG tablet Take 5 mg by mouth at bedtime.      Marland Kitchen aspirin 81 MG tablet Take 81 mg by mouth daily.      . B Complex-C (B-COMPLEX WITH VITAMIN C) tablet Take 1 tablet by mouth daily.      . calcium carbonate (OS-CAL) 600 MG TABS Take 600 mg by mouth daily. Takes 500 mg with 200mg  vitamin D      . cyanocobalamin 500 MCG tablet Take 500 mcg by mouth daily.        Marland Kitchen HYDROcodone-acetaminophen (VICODIN) 5-500 MG per tablet Take 1 tablet by mouth every 6 (six) hours as needed. As needed for pain.      . metoprolol (LOPRESSOR) 50 MG tablet Take 0.5 tablets (25 mg total) by mouth 2 (two) times daily.  60 tablet  6  . pantoprazole (PROTONIX) 40 MG tablet Take 40 mg by mouth daily before breakfast.      . simvastatin (ZOCOR) 20 MG tablet Take 1 tablet (20 mg total) by mouth at bedtime.  30 tablet  6  . EPINEPHrine (EPIPEN) 0.3 mg/0.3 mL DEVI Inject 0.3 mg into the muscle as directed.         Review Of Systems: Per HPI. Otherwise 12 point review of systems was performed and was unremarkable.  Physical Exam: BP 143/83  Pulse 69  Temp 97.9 F (36.6 C) (Oral)  Resp 25  SpO2 95% Exam: General: elderly lady resting comfortably in bed. NAD. HEENT: NCAT. PERRLA Cardiovascular: RRR. No  murmurs, rubs, or gallops. Respiratory: CTAB. No rales, rhonchi, or wheeze. Abdomen: soft, slight tenderness to palpation in the Left Lower Quadrant. Non distended. +BS. Extremities: warm, well perfused. No appreciable edema. Skin: No rash. Neuro: No focal deficits.  Labs and Imaging: CBC BMET   Lab 05/12/12 1029  WBC 8.6  HGB 14.1  HCT 40.5  PLT 194    Lab 05/12/12 1029  NA 135  K 2.8*  CL 98  CO2 20  BUN 18  CREATININE 1.63*  GLUCOSE 132*  CALCIUM 8.4     Urinalysis    Component Value Date/Time   COLORURINE YELLOW 05/12/2012 1721   APPEARANCEUR CLOUDY* 05/12/2012 1721   LABSPEC 1.003* 05/12/2012 1721   PHURINE 6.5 05/12/2012 1721   GLUCOSEU NEGATIVE 05/12/2012  1721   HGBUR MODERATE* 05/12/2012 1721   HGBUR negative 05/27/2010 1047   BILIRUBINUR NEGATIVE 05/12/2012 1721   BILIRUBINUR neg 09/21/2011 1218   KETONESUR NEGATIVE 05/12/2012 1721   PROTEINUR NEGATIVE 05/12/2012 1721   UROBILINOGEN 0.2 05/12/2012 1721   UROBILINOGEN 1.0 09/21/2011 1218   NITRITE NEGATIVE 05/12/2012 1721   NITRITE neg 09/21/2011 1218   LEUKOCYTESUR LARGE* 05/12/2012 1721   Urine Cx - Pending  Ct Abdomen Pelvis Wo Contrast 05/12/2012  IMPRESSION:  1. Moderate distension of the gallbladder, without definite findings to suggest acute cholecystitis.  There is a suggestion of some high attenuation material in the neck of the gallbladder. Additionally, the common bile duct appears dilated (13 mm).  No definite calcified stone in the common bile duct is identified. Correlation with right upper quadrant abdominal ultrasound may be useful to evaluate for potential gallstones, choledocholithiasis and/or acute cholecystitis. 2.  No overt signs of colitis. 3.  Colonic diverticulosis without findings to suggest acute diverticulitis at this time. 4.  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 5. 2.6 x 1.9 cm intermediate attenuation lesion in the right lobe of the liver is  incompletely characterized on this examination. This too could be further evaluated at time of follow-up ultrasound examination. 6.  Additional incidental findings, as above.    US Abdomen Complete 05/12/2012  IMPRESSION:  1.  Cholelithiasis without sonographic findings for acute cholecystitis. 2.  Common bile duct dilatation without obvious common bile duct stone. 3.  Small renal cysts and mild left renal hydronephrosis. 4.  The right hepatic lobe liver lesion (seen on CT scan)is not well seen on ultrasound.   Original Report Authenticated By: Rudie Meyer, M.D.    Everlene Other, DO 05/12/2012, 6:38 PM  Patient seen, examined. Available data reviewed. Agree with findings, assessment, and plan as outlined by Dr. Adriana Simas.  My additional findings are documented and highlighted above.  Marena Chancy, PGY-2 Family Medicine Resident

## 2012-05-12 NOTE — ED Provider Notes (Signed)
History     CSN: [id number]  Arrival date & time 05/12/12  1014   First MD Initiated Contact with Patient 05/12/12 1109      Chief Complaint  Patient presents with  . Diarrhea    (Consider location/radiation/quality/duration/timing/severity/associated sxs/prior treatment) HPI The patient presents with concerns of ongoing diarrhea, generalized fatigue.  The patient has no focal pain, but there is generalized body aches.  Symptoms began without clear provocation.  Since onset she is in no relief with anything, including Pepto-Bismol.  She notes that she has associated anorexia, but is intolerant of by mouth.  There have been fevers and chills throughout.  No clear alleviating factors.   No chest pain, no dyspnea, no disorientation or confusion. The patient denies rectal bleeding or blood in stool.  Past Medical History  Diagnosis Date  . Hypertension   . Hyperlipidemia   . Anxiety   . UTI (urinary tract infection)   . Depression   . Osteoporosis     Past Surgical History  Procedure Date  . Hystrectomy   . Bladder tact   . Abdominal hysterectomy   . Incontinence surgery     Family History  Problem Relation Age of Onset  . Heart disease Mother   . Cancer Sister     breast  . Cancer Brother     skin, kidney, lung    History  Substance Use Topics  . Smoking status: Never Smoker   . Smokeless tobacco: Never Used  . Alcohol Use: No    OB History    Grav Para Term Preterm Abortions TAB SAB Ect Mult Living                  Review of Systems  Constitutional:       Per HPI, otherwise negative  HENT:       Per HPI, otherwise negative  Eyes: Negative.   Respiratory:       Per HPI, otherwise negative  Cardiovascular:       Per HPI, otherwise negative  Gastrointestinal: Negative for vomiting.  Genitourinary: Negative.   Musculoskeletal:       Per HPI, otherwise negative  Skin: Negative.   Neurological: Negative for syncope.    Allergies  Coreg;  Lisinopril; Sertraline hcl; and Carvedilol  Home Medications   Current Outpatient Rx  Name  Route  Sig  Dispense  Refill  . AMLODIPINE BESYLATE 5 MG PO TABS   Oral   Take 5 mg by mouth at bedtime.         . ASPIRIN 81 MG PO TABS   Oral   Take 81 mg by mouth daily.         . B COMPLEX-C PO TABS   Oral   Take 1 tablet by mouth daily.         Marland Kitchen CALCIUM CARBONATE 600 MG PO TABS   Oral   Take 600 mg by mouth daily. Takes 500 mg with 200mg  vitamin D         . CORICIDIN HBP COLD/FLU PO   Oral   Take 2 tablets by mouth once as needed. As needed for cold/flu symptoms.         . CYANOCOBALAMIN 500 MCG PO TABS   Oral   Take 500 mcg by mouth daily.           Marland Kitchen HYDROCHLOROTHIAZIDE 12.5 MG PO CAPS   Oral   Take 12.5 mg by mouth daily.         Marland Kitchen  HYDROCODONE-ACETAMINOPHEN 5-500 MG PO TABS   Oral   Take 1 tablet by mouth every 6 (six) hours as needed. As needed for pain.         Marland Kitchen METOPROLOL TARTRATE 50 MG PO TABS   Oral   Take 0.5 tablets (25 mg total) by mouth 2 (two) times daily.   60 tablet   6   . OVER THE COUNTER MEDICATION   Oral   Take 2 tablets by mouth daily. Potassium Chloride.         Marland Kitchen PANTOPRAZOLE SODIUM 40 MG PO TBEC   Oral   Take 40 mg by mouth daily before breakfast.         . SIMVASTATIN 20 MG PO TABS   Oral   Take 1 tablet (20 mg total) by mouth at bedtime.   30 tablet   6   . ALENDRONATE SODIUM 70 MG PO TABS   Oral   Take 70 mg by mouth every 7 (seven) days. Take with a full glass of water on an empty stomach. Monday         . EPINEPHRINE 0.3 MG/0.3ML IJ DEVI   Intramuscular   Inject 0.3 mg into the muscle as directed.             BP 133/68  Pulse 88  Temp 97.9 F (36.6 C) (Oral)  Resp 15  SpO2 96%  Physical Exam  Nursing note and vitals reviewed. Constitutional: She is oriented to person, place, and time. She appears well-developed and well-nourished. No distress.  HENT:  Head: Normocephalic and atraumatic.    Eyes: Conjunctivae normal and EOM are normal.  Cardiovascular: Normal rate and regular rhythm.   Pulmonary/Chest: Effort normal and breath sounds normal. No stridor. No respiratory distress.  Abdominal: She exhibits no distension. There is tenderness in the right lower quadrant, suprapubic area and left lower quadrant. There is guarding. There is no rigidity.  Musculoskeletal: She exhibits no edema.  Neurological: She is alert and oriented to person, place, and time. No cranial nerve deficit.  Skin: Skin is warm and dry.  Psychiatric: She has a normal mood and affect.    ED Course  Procedures (including critical care time)  Labs Reviewed  CBC WITH DIFFERENTIAL - Abnormal; Notable for the following:    Neutrophils Relative 79 (*)     All other components within normal limits  COMPREHENSIVE METABOLIC PANEL - Abnormal; Notable for the following:    Potassium 2.8 (*)     Glucose, Bld 132 (*)     Creatinine, Ser 1.63 (*)     GFR calc non Af Amer 29 (*)     GFR calc Af Amer 34 (*)     All other components within normal limits  LIPASE, BLOOD  URINALYSIS, MICROSCOPIC ONLY   No results found.   No diagnosis found.  Pulse ox 99% room air normal  Update: patient remains comfortable appearing.  CT abnormal - [place] ordered.  US demonstrates dilated CBD w stone, though no obstruction.  Update: patient and family informed of results.  She remains HD stable. MDM  This elderly female presents with several days of diarrhea, generalized pain, and focal abdominal pain by the time of exam the patient has no abdominal pain, though she describes diffuse discomfort.  The patient is mentating well, but appears listless.  Patient's evaluation is notable for demonstrated a dilated common bile duct, large gallstone.  Though this is unusual for a presentation most concerning to the patient  for diarrhea, given her comorbidities, her age, concern for biliary colic versus early cholecystitis versus other  hepatobiliary dysfunction, she will be admitted for further evaluation and management.        Gerhard Munch, MD 05/12/12 2007

## 2012-05-12 NOTE — ED Notes (Signed)
Pt c/o diarrhea since Wednesday, describes as "black." denies any n/v. C/o body aches and abd pain yesterday but none today. Took pepto bismol today with no relief. States she has been able to tolerate po fluids and did take her am meds today.

## 2012-05-12 NOTE — ED Notes (Signed)
Patient transported to CT 

## 2012-05-12 NOTE — Progress Notes (Signed)
PHARMACIST - PHYSICIAN COMMUNICATION  CONCERNING: P&T Medication Policy Regarding Oral Bisphosphonates  RECOMMENDATION: Your order for alendronate (Fosamax) has been discontinued at this time.  If the patient's post-hospital medical condition warrants safe use of this class of drugs, please resume the pre-hospital regimen upon discharge.  DESCRIPTION:  Alendronate (Fosamax) can cause severe esophageal erosions in patients who are unable to remain upright at least 30 minutes after taking this medication.   Since brief interruptions in therapy are thought to have minimal impact on bone mineral density, the Pharmacy & Therapeutics Committee has established that bisphosphonate orders should be routinely discontinued during hospitalization.   To override this safety policy and permit administration of Boniva, Fosamax, or Actonel in the hospital, prescribers must write "DO NOT HOLD" in the comments section when placing the order for this class of medications.    

## 2012-05-13 DIAGNOSIS — R197 Diarrhea, unspecified: Secondary | ICD-10-CM

## 2012-05-13 DIAGNOSIS — E876 Hypokalemia: Secondary | ICD-10-CM | POA: Diagnosis present

## 2012-05-13 DIAGNOSIS — E860 Dehydration: Secondary | ICD-10-CM | POA: Diagnosis present

## 2012-05-13 DIAGNOSIS — N179 Acute kidney failure, unspecified: Secondary | ICD-10-CM | POA: Diagnosis present

## 2012-05-13 LAB — COMPREHENSIVE METABOLIC PANEL
AST: 23 U/L (ref 0–37)
Albumin: 2.9 g/dL — ABNORMAL LOW (ref 3.5–5.2)
BUN: 11 mg/dL (ref 6–23)
Calcium: 7.6 mg/dL — ABNORMAL LOW (ref 8.4–10.5)
Chloride: 112 mEq/L (ref 96–112)
Creatinine, Ser: 1.32 mg/dL — ABNORMAL HIGH (ref 0.50–1.10)
Total Bilirubin: 0.2 mg/dL — ABNORMAL LOW (ref 0.3–1.2)

## 2012-05-13 LAB — CBC
Hemoglobin: 11.9 g/dL — ABNORMAL LOW (ref 12.0–15.0)
MCH: 30.7 pg (ref 26.0–34.0)
MCV: 91.7 fL (ref 78.0–100.0)
Platelets: 157 10*3/uL (ref 150–400)
RBC: 3.87 MIL/uL (ref 3.87–5.11)
WBC: 4.6 10*3/uL (ref 4.0–10.5)

## 2012-05-13 LAB — URINE CULTURE

## 2012-05-13 NOTE — H&P (Signed)
FMTS Attending Admit Note Patient seen and examined by me, discussed with Dr Gwenlyn Saran and I agree with her assessment and plan, with following additions: Briefly, a 79yoF patient of [facility], who presents with approximately one week of gradually worsening generalized weakness and diarrhoeal illness, as well as increased urinary frequency and dysuria.  She reports one temp to 100.96F at home, denies nausea or vomiting, says her appetite has been good.  She denies recent hospitalization or abx use, but does report that her husband has been suffering from a diarrhoeal illness for the past 2 weeks; he has not been hospitalized nor on abx either.  On abdominal imaging at admission ([place] and CT), she is found to have cholelithiasis without cholecystitis, as well as question of lesion in R lobe of liver.  UA with many bacteria, numerous WBCs and some RBCs. Serum Cr mildly elevated at admission to 1.6, now down to 1.3 this morning after gentle rehydration with IVF. This morning patient reports marked improvement in her general well-being.  Is eating breakfast as I interview her.  Has not stooled since yesterday, reports no dysuria this morning.  She appears well, moist mucus membranes. Neck supple.  Abd soft, nontender, no Murphys sign on exam.  Audible bowel sounds. No masses.   Assess/Plan: Patient admitted with generalized weakness and acute diarrhoeal illness; based upon UA micro and symptoms, would treat her for uncomplicated lower tract infection.  Agree with C diff PCR, although inability to produce a sample and her overall well being and benign abd exam go against this.  PT/OT evaluation today; patient and husband live alone and patient performs her own ADLs with the aid of walkers and canes, but reports several falls due to "weakness" in recent times.  Has a step-son who lives in the house but is not a caregiver.  Paula Compton, MD

## 2012-05-13 NOTE — Progress Notes (Signed)
FMTS Attending Note Patient seen and examined by me, see H&P for further details. Paula Compton, MD

## 2012-05-13 NOTE — Progress Notes (Signed)
Daily Progress Note  Family Medicine Resident Pager [phone]  Patient name: Destiny Moore Medical record number: [id number] Date of birth: [birth year] Age: 79 y.o. Gender: female  Overview: Destiny Moore is a 79 y.o. year old female with a PMH of HTN, HLD, Depression and CKD stage 3 who presents with complaints of diarrhea, generalized weakness, and abdominal discomfort.    Subjective: Overnight no bowel movements, no pain, has eaten breakfast and lunch without problems  Objective: Vital signs in last 24 hours: Temp:  [98.2 F (36.8 C)-98.8 F (37.1 C)] 98.2 F (36.8 C) (01/12 0609) Pulse Rate:  [67-97] 84  (01/12 1005) Resp:  [16-25] 18  (01/12 0609) BP: (120-152)/(61-116) 120/74 mmHg (01/12 1005) SpO2:  [93 %-98 %] 96 % (01/12 0609) Weight:  [156 lb (70.761 kg)] 156 lb (70.761 kg) (01/11 2238) Weight change:  Last BM Date: 05/12/12  Intake/Output from previous day: 01/11 0701 - 01/12 0700 In: 1165 [P.O.:240; I.V.:925] Out: 500 [Urine:500] Intake/Output this shift:    General: elderly lady resting comfortably in bed. NAD. Pleasant and conversant  HEENT: NCAT. PERRLA  Cardiovascular: RRR. No murmurs, rubs, or gallops.  Respiratory: CTAB. No rales, rhonchi, or wheeze.  Abdomen: NABS, NO TENDERNESS to palpation Extremities: warm, well perfused. No appreciable edema.  Skin: No rash.  Neuro: No focal deficits.   Lab Results:  Basename 05/13/12 0600 05/12/12 1029  WBC 4.6 8.6  HGB 11.9* 14.1  HCT 35.5* 40.5  PLT 157 194   BMET  Basename 05/13/12 0600 05/12/12 1029  NA 142 135  K 4.5 2.8*  CL 112 98  CO2 21 20  GLUCOSE 85 132*  BUN 11 18  CREATININE 1.32* 1.63*  CALCIUM 7.6* 8.4    Studies/Results: CT Abdomen: IMPRESSION:  1. Moderate distension of the gallbladder, without definite findings to suggest acute cholecystitis.  There is a suggestion of some high attenuation material in the neck of the gallbladder. Additionally, the common bile duct appears  dilated (13 mm).  No definite calcified stone in the common bile duct is identified. Correlation with right upper quadrant abdominal ultrasound may be useful to evaluate for potential gallstones, choledocholithiasis and/or acute cholecystitis. 2.  No overt signs of colitis. 3.  Colonic diverticulosis without findings to suggest acute diverticulitis at this time. 4.  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 5. 2.6 x 1.9 cm intermediate attenuation lesion in the right lobe of the liver is incompletely characterized on this examination. This too could be further evaluated at time of follow-up ultrasound examination. 6.  Additional incidental findings, as above.   Original Report Authenticated By: Trudie Reed, M.D.    Ultrasound: IMPRESSION:  1.  Cholelithiasis without sonographic findings for acute cholecystitis. 2.  Common bile duct dilatation without obvious common bile duct stone. 3.  Small renal cysts and mild left renal hydronephrosis. 4.  The right hepatic lobe liver lesion (seen on CT scan)is not well seen on ultrasound.     Medications:  Scheduled:   . amLODipine  7.5 mg Oral Daily  . aspirin EC  81 mg Oral Daily  . heparin  5,000 Units Subcutaneous Q8H  . hydrochlorothiazide  12.5 mg Oral Daily  . metoprolol  25 mg Oral BID  . pantoprazole  40 mg Oral QAC breakfast  . simvastatin  20 mg Oral QHS   Continuous:   . sodium chloride 100 mL/hr at 05/13/12 0604   ONG:[id number], acetaminophen, HYDROcodone-acetaminophen, ondansetron (ZOFRAN) IV, ondansetron  Assessment/Plan: Destiny Moore is a 79 y.o. year old female with a PMH of HTN, HLD, Depression and CKD stage 3 who presents with complaints of diarrhea, generalized weakness, and abdominal discomfort. Patient was afebrile in the ED with no leukocytosis and stable vital signs. CT scan of the abdomen was obtained and revealed moderate distension of the gallbladder, without definite  findings to suggest acute cholecystitis, and common bile duct dilation (13mm) with no obvious stone. LFT's and lipase were normal. BMP revealed elevated creatinine of 1.63 (patient's baseline appears to be 1.2-1.4). Given CT findings and evidence of AKI we were called to admit for observation.    # Abdominal pain/discomfort and Diarrhea - WBC normal, afebrile, lipase, LFT's WNL, CT - mod distention of CBD; no cholecystitis on CT or U/S - Will control pain via Hydrocodone 5-325 mg Q4H PRN. Zofran PRN nausea  - F/u C diff PCR nad FOBT - Also will check FOBT   # Acute on Chronic Kidney Disease - patient has underlying stage 3 CKD with baseline creatinine of 1.4. Likely pre-renal - Returned to baseline with hydration   # UTI - UA showed Hgb, Leuk, many bacteria - On ROS, patient reported some dysuria.  - Gram stain and cx pending   # Hypokalemia: likely from diarrhea, S/p 3 runs K+ in ED - Resolved  #Incidental findings of right hepatic lobe lesions on CT and [place].  - consider repeat US as outpatient.   # HTN  - Will continue home Norvasc, Lopressor, and HCTZ, ASA   # HLD  - Will continue Zocor 20 mg    #GERD  - Protonix 40 mg daily   FEN/GI: Regular diet  Prophylaxis: Heparin SQ  Disposition: Appropriate for d/c    LOS: 1 day   Mat Carne 05/13/2012, 11:58 AM

## 2012-05-13 NOTE — Progress Notes (Signed)
Discharge instructions given to patient and family.  They verbalized understanding of all instructions.  Pt taken down for discharge via wheelchair with husband.

## 2012-05-13 NOTE — Discharge Summary (Signed)
FMTS Attending Note Patient seen and examined on the day of discharge, I agree with plans for discharge. Paula Compton, MD

## 2012-05-13 NOTE — Progress Notes (Signed)
Utilization review completed.  

## 2012-05-13 NOTE — Discharge Summary (Signed)
Physician Discharge Summary  Patient ID: IRETTA MANGRUM MRN: [id number] DOB/AGE: 1932/06/09 79 y.o.  Admit date: 05/12/2012 Discharge date: 05/13/2012  Admission Diagnoses: Abdominal pain, dehydration, hypokalemia  Discharge Diagnoses:  Principal Problem:  *Dehydration Active Problems:  Hypokalemia  Diarrhea  Acute kidney injury   Discharged Condition: good  Hospital Course: 79 year old F who presented with abdominal pain, diarrhea, hypokalemia and dehydration.   Abd pain and diarrhea - CT scan and u/s showed dilation of CBD but no cholecystitis; Pain likely secondary to cramping from diarrhea; Both resolved upon admission to hospital  Hypokalemia - Likely 2/2 diarrhea; given K+ IV x 3 and rose to 4.5 at discharge  Acute Kidney Injury - Creat 1.63 at admission, improved to 1.32 at d/c so consistent with pre-renal  Dehydration - 2/2 to diarrhea, received fluids IV and resumed PO intake   Consults: None  Significant Diagnostic Studies:   CT Abdomen: IMPRESSION: 1. Moderate distension of the gallbladder, without definite findings to suggest acute cholecystitis. There is a suggestion of some high attenuation material in the neck of the gallbladder. Additionally, the common bile duct appears dilated (13 mm). No definite calcified stone in the common bile duct is identified. Correlation with right upper quadrant abdominal ultrasound may be useful to evaluate for potential gallstones, choledocholithiasis and/or acute cholecystitis. 2. No overt signs of colitis. 3. Colonic diverticulosis without findings to suggest acute diverticulitis at this time. 4. 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 5. 2.6 x 1.9 cm intermediate attenuation lesion in the right lobe of the liver is incompletely characterized on this examination. This too could be further evaluated at time of follow-up ultrasound examination. 6. Additional incidental findings, as  above. Original Report Authenticated By: Trudie Reed, M.D.  Ultrasound: IMPRESSION: 1. Cholelithiasis without sonographic findings for acute cholecystitis. 2. Common bile duct dilatation without obvious common bile duct stone. 3. Small renal cysts and mild left renal hydronephrosis. 4. The right hepatic lobe liver lesion (seen on CT scan)is not well seen on ultrasound.    Discharge Exam: Blood pressure 120/74, pulse 84, temperature 98.2 F (36.8 C), temperature source Oral, resp. rate 18, height 5\' 1"  (1.549 m), weight 156 lb (70.761 kg), SpO2 96.00%. General: elderly lady resting comfortably in bed. NAD. Pleasant and conversant  HEENT: NCAT. PERRLA  Cardiovascular: RRR. No murmurs, rubs, or gallops.  Respiratory: CTAB. No rales, rhonchi, or wheeze.  Abdomen: NABS, NO TENDERNESS to palpation  Extremities: warm, well perfused. No appreciable edema.  Skin: No rash.  Neuro: No focal deficits.   Follow Up Issues: - Fluid status and potassium mgt as patient is confused as to whether she needs to be on K+ as outpatient  Disposition: 01-Home or Self Care      Discharge Orders    Future Orders Please Complete By Expires   Discharge patient          Medication List     As of 05/13/2012  2:01 PM    TAKE these medications         alendronate 70 MG tablet   Commonly known as: FOSAMAX   Take 70 mg by mouth every 7 (seven) days. Take with a full glass of water on an empty stomach. Monday      amLODipine 5 MG tablet   Commonly known as: NORVASC   Take 5 mg by mouth at bedtime.      aspirin 81 MG tablet   Take 81 mg by  mouth daily.      B-complex with vitamin C tablet   Take 1 tablet by mouth daily.      calcium carbonate 600 MG Tabs   Commonly known as: OS-CAL   Take 600 mg by mouth daily. Takes 500 mg with 200mg  vitamin D      CORICIDIN HBP COLD/FLU PO   Take 2 tablets by mouth once as needed. As needed for cold/flu symptoms.      cyanocobalamin 500 MCG tablet   Take  500 mcg by mouth daily.      EPIPEN 0.3 mg/0.3 mL Devi   Generic drug: EPINEPHrine   Inject 0.3 mg into the muscle as directed.      hydrochlorothiazide 12.5 MG capsule   Commonly known as: MICROZIDE   Take 12.5 mg by mouth daily.      HYDROcodone-acetaminophen 5-500 MG per tablet   Commonly known as: VICODIN   Take 1 tablet by mouth every 6 (six) hours as needed. As needed for pain.      metoprolol 50 MG tablet   Commonly known as: LOPRESSOR   Take 0.5 tablets (25 mg total) by mouth 2 (two) times daily.      OVER THE COUNTER MEDICATION   Take 2 tablets by mouth daily. Potassium Chloride.      pantoprazole 40 MG tablet   Commonly known as: PROTONIX   Take 40 mg by mouth daily before breakfast.      simvastatin 20 MG tablet   Commonly known as: ZOCOR   Take 1 tablet (20 mg total) by mouth at bedtime.         Follow-up Information    Follow up with MATTHEWS,CODY, DO. Schedule an appointment as soon as possible for a visit in 3 days.   Contact information:   [address] [place] [address] [phone]          Signed: Mat Carne 05/13/2012, 2:01 PM

## 2012-05-14 ENCOUNTER — Ambulatory Visit (INDEPENDENT_AMBULATORY_CARE_PROVIDER_SITE_OTHER): Payer: Medicare Other | Admitting: Family Medicine

## 2012-05-14 VITALS — BP 133/78 | HR 66 | Temp 97.6°F | Ht 61.0 in | Wt 157.0 lb

## 2012-05-14 DIAGNOSIS — R197 Diarrhea, unspecified: Secondary | ICD-10-CM

## 2012-05-14 DIAGNOSIS — N179 Acute kidney failure, unspecified: Secondary | ICD-10-CM

## 2012-05-14 LAB — COMPREHENSIVE METABOLIC PANEL
ALT: 18 U/L (ref 0–35)
Albumin: 4.2 g/dL (ref 3.5–5.2)
Alkaline Phosphatase: 51 U/L (ref 39–117)
Potassium: 4.1 mEq/L (ref 3.5–5.3)
Sodium: 141 mEq/L (ref 135–145)
Total Bilirubin: 0.4 mg/dL (ref 0.3–1.2)
Total Protein: 6.5 g/dL (ref 6.0–8.3)

## 2012-05-14 MED ORDER — HYDROCODONE-ACETAMINOPHEN 5-500 MG PO TABS: 1.0000 | ORAL_TABLET | Freq: Four times a day (QID) | ORAL | Status: DC | PRN

## 2012-05-14 NOTE — Patient Instructions (Signed)
Avoid immodium until I call with the results of your labs   B.R.A.T. Diet Your doctor has recommended the B.R.A.T. diet for you or your child until the condition improves. This is often used to help control diarrhea and vomiting symptoms. If you or your child can tolerate clear liquids, you may have:  Bananas.   Rice.   Applesauce.   Toast (and other simple starches such as crackers, potatoes, noodles).  Be sure to avoid dairy products, meats, and fatty foods until symptoms are better. Fruit juices such as apple, grape, and prune juice can make diarrhea worse. Avoid these. Continue this diet for 2 days or as instructed by your caregiver. Document Released: 04/18/2005 Document Revised: 04/07/2011 Document Reviewed: 10/05/2006 [facility] Patient Information 2012 [place], [place].

## 2012-05-15 ENCOUNTER — Telehealth: Payer: Self-pay | Admitting: Family Medicine

## 2012-05-15 DIAGNOSIS — R195 Other fecal abnormalities: Secondary | ICD-10-CM

## 2012-05-15 LAB — CLOSTRIDIUM DIFFICILE BY PCR: Toxigenic C. Difficile by PCR: NOT DETECTED

## 2012-05-15 NOTE — Assessment & Plan Note (Signed)
Check chemistry to be sure Cr is stable

## 2012-05-15 NOTE — Assessment & Plan Note (Signed)
Started again this morning.  She brought sample today, will send for stool culture, hemoccult and c. Diff.  Advised to avoid immodium until c. Diff returns.

## 2012-05-15 NOTE — Telephone Encounter (Signed)
Called patient with + hemoccult.  Diarrhea is better, feels ok.  No dizziness or other symptoms.  Will come in tomorrow morning to have cbc drawn.

## 2012-05-15 NOTE — Progress Notes (Signed)
  Subjective:    Patient ID: Destiny Moore, female    DOB: 11-20-1932, 79 y.o.   MRN: [id number]  HPI  1. HFU:  Discharged from hospital on 1/12 after hospitalization for diarrhea. Diarrhea had stopped yesterday but has started again today.  Abdominal CT showed possible gallbladder disease, abdominal ultrasound showed cholelithiasis without cholecystitis.  C. Diff and hemeoccult were ordered but not collected prior to discharge.  She has had some abdominal "cramping".  States her stool is loose and dark in color.  She has not seen any gross blood and denies nausea or vomiting.   Review of Systems Per HPI    Objective:   Physical Exam  Constitutional: She appears well-nourished. No distress.  HENT:  Head: Normocephalic and atraumatic.  Cardiovascular: Normal rate and regular rhythm.   Abdominal: Soft. Bowel sounds are normal. She exhibits no distension. There is no tenderness.          Assessment & Plan:

## 2012-05-16 ENCOUNTER — Other Ambulatory Visit: Payer: Medicare Other

## 2012-05-16 DIAGNOSIS — R195 Other fecal abnormalities: Secondary | ICD-10-CM

## 2012-05-16 LAB — CBC
HCT: 39.5 % (ref 36.0–46.0)
MCH: 30.9 pg (ref 26.0–34.0)
MCHC: 33.4 g/dL (ref 30.0–36.0)
MCV: 92.5 fL (ref 78.0–100.0)
Platelets: 216 10*3/uL (ref 150–400)
RDW: 13.6 % (ref 11.5–15.5)

## 2012-05-16 NOTE — Progress Notes (Signed)
CBC DONE TODAY Destiny Moore 

## 2012-05-17 ENCOUNTER — Telehealth: Payer: Self-pay | Admitting: Family Medicine

## 2012-05-17 NOTE — Telephone Encounter (Signed)
Returned call to patient.  Having cramps in fingers and arms.  Started yesterday evening.  Has been taking OTC [facility] med for cramps since last night and not helping.  Declined offer to go to urgent care or ED.  Scheduled work-in appt for tomorrow morning at 9:30 am.  Gaylene Brooks, RN

## 2012-05-17 NOTE — Telephone Encounter (Signed)
Pt is having real bad cramps in arms and legs and isn't sure what to do -

## 2012-05-18 ENCOUNTER — Ambulatory Visit (INDEPENDENT_AMBULATORY_CARE_PROVIDER_SITE_OTHER): Payer: Medicare Other | Admitting: Family Medicine

## 2012-05-18 ENCOUNTER — Encounter: Payer: Self-pay | Admitting: Family Medicine

## 2012-05-18 VITALS — BP 150/81 | HR 61 | Temp 98.8°F | Wt 154.8 lb

## 2012-05-18 DIAGNOSIS — R252 Cramp and spasm: Secondary | ICD-10-CM | POA: Insufficient documentation

## 2012-05-18 DIAGNOSIS — N179 Acute kidney failure, unspecified: Secondary | ICD-10-CM

## 2012-05-18 DIAGNOSIS — R197 Diarrhea, unspecified: Secondary | ICD-10-CM

## 2012-05-18 LAB — COMPREHENSIVE METABOLIC PANEL
AST: 25 U/L (ref 0–37)
Alkaline Phosphatase: 47 U/L (ref 39–117)
BUN: 19 mg/dL (ref 6–23)
Calcium: 9.2 mg/dL (ref 8.4–10.5)
Creat: 1.71 mg/dL — ABNORMAL HIGH (ref 0.50–1.10)
Total Bilirubin: 0.7 mg/dL (ref 0.3–1.2)

## 2012-05-18 LAB — STOOL CULTURE

## 2012-05-18 NOTE — Patient Instructions (Addendum)
Unsure of reason for cramps, could be potassium or calcium. Stop taking fosamax and aspirin until you see Dr. Ashley Royalty.  Keep taking protonix daily. I will call if you need to start potassium again. Make appointment with Dr. Ashley Royalty on Monday or Tuesday next week.

## 2012-05-18 NOTE — Assessment & Plan Note (Addendum)
Continues with dark stools and epigastric pain on exam today. +FOB, but seems like her hemoglobin would be lower with a gross bleed. There are no hemodynamic signs of large bleeding either.  There may be concern for gastritis vs esophagitis vs PUD with current symptoms vs less likely related to gallbladder or IBS. Advised patient to hold her fosamax and aspirin for now. Repeat hemoglobin. Continue PPI. Consider GI referral for EGD if GI blood loss is a concern. Gave instructions to seek emergency care if symptoms worsen, gross bleeding or other concerns. F/u in clinic early next week with PCP.

## 2012-05-18 NOTE — Assessment & Plan Note (Signed)
Stable. Could be recurrence of hypokalemia with continued loose stools and on diuretic. Will check CMET today. Consider change back to prescription form pending results, would seem to be easier to monitor than the OTC formulation.

## 2012-05-18 NOTE — Assessment & Plan Note (Signed)
Improving last check. Repeat CMET.

## 2012-05-18 NOTE — Progress Notes (Signed)
  Subjective:    Patient ID: Destiny Moore, female    DOB: 1932/07/29, 79 y.o.   MRN: [id number]  HPI Presents with husband today.  79 yo female with recent diarrhea, abdominal pain with +FOB stools presents with 2-3 days of muscle cramping in hands, arms, legs. Was the worst yesterday, but today is somewhat improved only noting mild leg cramp this morning. Tried an OTC homeopathic agent for cramps that did not help.   She was recently hospitalized for hypokalemia and dehydration secondary to diarrhea and discharged 05/13/12.  Notably had CT abdomen and US showing nonspecific gallbladder CBD dilation without obstruction and was treated symptomatically. With complaint of dark tarry loose stools, a FOB was checked in clinic this week and found positive, but had normal hemoglobin 13.2 yesterday.  Has been taking OTC potassium supplement since coming home, because her rx form had increased in price and they decided not to buy it.She endorses loose stools x 2 today with dark color, epigastric pain. She did eat well past 2 days.   Review of Systems She denies any chest pain, palpitations, dyspnea, leg swelling, redness, falls, injuries, confusion, nausea, emesis.     Objective:   Physical Exam  Vitals reviewed. Constitutional: She is oriented to person, place, and time. She appears well-developed and well-nourished. No distress.       Appears slightly anxious.  HENT:  Head: Normocephalic and atraumatic.  Mouth/Throat: Oropharynx is clear and moist. No oropharyngeal exudate.  Eyes: EOM are normal.  Cardiovascular: Normal rate, regular rhythm, normal heart sounds and intact distal pulses.   No murmur heard. Pulmonary/Chest: Effort normal and breath sounds normal. No respiratory distress. She has no wheezes. She has no rales.  Abdominal: Soft. Bowel sounds are normal. She exhibits no distension and no mass. There is tenderness. There is no rebound and no guarding.       Epigastric TTP is moderate    Musculoskeletal: She exhibits no edema and no tenderness.       Hands appear normal, no cramping witnessed, no tenderness.  Neurological: She is alert and oriented to person, place, and time. No cranial nerve deficit.  Psychiatric: She has a normal mood and affect.       Assessment & Plan:

## 2012-05-23 ENCOUNTER — Ambulatory Visit (INDEPENDENT_AMBULATORY_CARE_PROVIDER_SITE_OTHER): Payer: Medicare Other | Admitting: Family Medicine

## 2012-05-23 ENCOUNTER — Encounter: Payer: Self-pay | Admitting: Family Medicine

## 2012-05-23 VITALS — BP 151/82 | HR 58 | Ht 61.0 in | Wt 150.0 lb

## 2012-05-23 DIAGNOSIS — K5900 Constipation, unspecified: Secondary | ICD-10-CM

## 2012-05-23 DIAGNOSIS — R197 Diarrhea, unspecified: Secondary | ICD-10-CM

## 2012-05-23 DIAGNOSIS — E876 Hypokalemia: Secondary | ICD-10-CM

## 2012-05-23 NOTE — Progress Notes (Signed)
  Subjective:    Patient ID: Destiny Moore, female    DOB: 1933-04-07, 79 y.o.   MRN: [id number]  HPI  1. Diarrhea:  Since last week has been doing much better.  Denies further episodes of diarrhea, now having some trouble with constipation.  She did have a heme positive stool last week but her hgb had been stable.  Her cramping she was having in her legs has resolved.  She denies any abdominal pain, nausea or vomiting.  Her appetite is normal.  Reports having a coloscopy fairly recently, although I can not find a report and she does not remember who did the procedure.    Review of Systems Per HPI    Objective:   Physical Exam  Constitutional:       Elderly female appears well nourished and well hydrated.    HENT:  Head: Atraumatic.  Cardiovascular: Normal rate and regular rhythm.   Abdominal: Soft. Bowel sounds are normal. She exhibits no distension. There is no tenderness. There is no rebound.  Musculoskeletal: She exhibits no edema.          Assessment & Plan:

## 2012-05-23 NOTE — Patient Instructions (Addendum)
Thank you for coming in today, it was good to see you Your potassium looks good on the supplement you are using over the counter, I think it is ok for you to continue this Try using fiber supplement (metamucil, benefiber).  Use 1/4 the recommeded amount on the bottle for one week, then increase to 1/2 the recommended amount for one week, then 3/4 the recommended amount for one week, then take the full amount.  If you experience gassiness or bloating, go back down to the previous dose for an additional week.

## 2012-05-23 NOTE — Assessment & Plan Note (Signed)
Resolved at this point.  No signs of dehydration, she is taking good PO intake.  Having some problems with constipation now, advised her to slowly increase dietary fiber and water intake.

## 2012-05-23 NOTE — Assessment & Plan Note (Signed)
Improved.  She is not taking KCL supplement, instead using OTC potassium gluconate.  Her K+ has been normal on this and I think it is ok for her to continue.

## 2012-07-30 ENCOUNTER — Other Ambulatory Visit: Payer: Self-pay | Admitting: *Deleted

## 2012-07-30 MED ORDER — PANTOPRAZOLE SODIUM 40 MG PO TBEC: 40.0000 mg | DELAYED_RELEASE_TABLET | Freq: Every day | ORAL | Status: DC

## 2012-08-31 ENCOUNTER — Telehealth: Payer: Self-pay | Admitting: *Deleted

## 2012-08-31 MED ORDER — HYDROCODONE-ACETAMINOPHEN 5-325 MG PO TABS: 1.0000 | ORAL_TABLET | Freq: Four times a day (QID) | ORAL | Status: DC | PRN

## 2012-08-31 NOTE — Telephone Encounter (Signed)
Pt needs new prescription equivalent for Hydrocodone 5/500 since that strength is no longer available. Pt has not had med filled since January [facility] [place], [place], [place]

## 2012-08-31 NOTE — Telephone Encounter (Signed)
Will send in 30 tabs hydrocodone 5/325mg , but needs follow up for further refills.

## 2012-08-31 NOTE — Addendum Note (Signed)
Addended by: Everrett Coombe on: 08/31/2012 10:52 AM   Modules accepted: Orders, Medications

## 2012-09-03 ENCOUNTER — Other Ambulatory Visit: Payer: Self-pay | Admitting: Family Medicine

## 2012-09-19 ENCOUNTER — Other Ambulatory Visit: Payer: Self-pay | Admitting: *Deleted

## 2012-09-20 MED ORDER — AMLODIPINE BESYLATE 5 MG PO TABS: 5.0000 mg | ORAL_TABLET | Freq: Every day | ORAL | Status: DC

## 2012-10-09 ENCOUNTER — Other Ambulatory Visit: Payer: Self-pay | Admitting: *Deleted

## 2012-10-09 MED ORDER — METOPROLOL TARTRATE 50 MG PO TABS: 25.0000 mg | ORAL_TABLET | Freq: Two times a day (BID) | ORAL | Status: DC

## 2012-10-09 NOTE — Telephone Encounter (Signed)
Requested Prescriptions   Pending Prescriptions Disp Refills  . metoprolol (LOPRESSOR) 50 MG tablet 60 tablet 6    Sig: Take 0.5 tablets (25 mg total) by mouth 2 (two) times daily.   Wyatt Haste, RN-BSN

## 2012-10-16 ENCOUNTER — Other Ambulatory Visit: Payer: Self-pay | Admitting: Family Medicine

## 2012-11-19 ENCOUNTER — Encounter: Payer: Self-pay | Admitting: Home Health Services

## 2012-12-02 ENCOUNTER — Other Ambulatory Visit: Payer: Self-pay | Admitting: Family Medicine

## 2012-12-07 ENCOUNTER — Other Ambulatory Visit: Payer: Self-pay | Admitting: *Deleted

## 2012-12-07 MED ORDER — SIMVASTATIN 20 MG PO TABS: 20.0000 mg | ORAL_TABLET | Freq: Every day | ORAL | Status: DC

## 2012-12-07 MED ORDER — PANTOPRAZOLE SODIUM 40 MG PO TBEC: 40.0000 mg | DELAYED_RELEASE_TABLET | Freq: Every day | ORAL | Status: DC

## 2012-12-07 MED ORDER — HYDROCHLOROTHIAZIDE 12.5 MG PO CAPS: 12.5000 mg | ORAL_CAPSULE | Freq: Every day | ORAL | Status: DC

## 2013-03-13 ENCOUNTER — Other Ambulatory Visit: Payer: Self-pay | Admitting: Family Medicine

## 2013-06-29 ENCOUNTER — Other Ambulatory Visit: Payer: Self-pay | Admitting: Family Medicine

## 2013-07-14 ENCOUNTER — Ambulatory Visit (INDEPENDENT_AMBULATORY_CARE_PROVIDER_SITE_OTHER): Payer: Medicare Other | Admitting: Emergency Medicine

## 2013-07-14 VITALS — BP 138/68 | HR 58 | Temp 97.9°F | Resp 14 | Ht 62.0 in | Wt 153.0 lb

## 2013-07-14 DIAGNOSIS — G589 Mononeuropathy, unspecified: Secondary | ICD-10-CM

## 2013-07-14 DIAGNOSIS — M79609 Pain in unspecified limb: Secondary | ICD-10-CM

## 2013-07-14 DIAGNOSIS — M79605 Pain in left leg: Secondary | ICD-10-CM

## 2013-07-14 DIAGNOSIS — G629 Polyneuropathy, unspecified: Secondary | ICD-10-CM

## 2013-07-14 MED ORDER — TRAMADOL HCL 50 MG PO TABS: 50.0000 mg | ORAL_TABLET | Freq: Three times a day (TID) | ORAL | Status: DC | PRN

## 2013-07-14 NOTE — Patient Instructions (Signed)

## 2013-07-14 NOTE — Progress Notes (Signed)
[facility] [address], [place] [address] [id number]  Date:  07/14/2013   Name:  Destiny Moore   DOB:  [birth year]   MRN:  [id number]  PCP:  Saralyn Pilar, DO    Chief Complaint: Leg Pain and Numbness   History of Present Illness:  Destiny Moore is a 80 y.o. very pleasant female patient who presents with the following:  Patient is a poor historian.  No history of injury but gives a history of severe pain in the left leg.  Has chronic back pain she says and is not on medication for pain.  Has undergone an EMG and NCS in past and describes it well but has no idea about the results other than to say she has "nerve damage" in her leg.  She was seen at the family practice center but her resident doc has gone and she has not been back.  She specifically denies falling or injury to her leg or any ecchymosis, swelling or redness.  No improvement with over the counter medications or other home remedies. Denies other complaint or health concern today.   Patient Active Problem List   Diagnosis Date Noted  . Diarrhea 05/23/2012  . Cramps, muscle, general 05/18/2012  . Hypokalemia 05/13/2012  . Acute kidney injury 05/13/2012  . Constipation 01/20/2011  . Generalized pain 01/20/2011  . Abnormal gait 01/20/2011  . Somatic complaints, multiple 07/26/2010  . Hypertension   . Depression 07/14/2010  . CKD (chronic kidney disease) stage 3, GFR 30-59 ml/min 06/04/2010  . UNSPECIFIED GENITAL PROLAPSE 05/27/2010  . HYPERLIPIDEMIA 06/17/2009  . PTSD 06/17/2009  . ESSENTIAL HYPERTENSION 06/17/2009  . OSTEOARTHRITIS, MULTIPLE JOINTS 06/17/2009  . OSTEOPOROSIS 06/17/2009    Past Medical History  Diagnosis Date  . Hypertension   . Hyperlipidemia   . Anxiety   . UTI (urinary tract infection)   . Depression   . Osteoporosis   . Allergy     Past Surgical History  Procedure Laterality Date  . Hystrectomy    . Bladder tact    . Abdominal hysterectomy     . Incontinence surgery      History  Substance Use Topics  . Smoking status: Never Smoker   . Smokeless tobacco: Never Used  . Alcohol Use: No    Family History  Problem Relation Age of Onset  . Heart disease Mother   . Cancer Sister     breast  . Cancer Brother     skin, kidney, lung    Allergies  Allergen Reactions  . Coreg Anaphylaxis  . Lisinopril Anaphylaxis    REACTION: Anaphylaxis  . Sertraline Hcl Other (See Comments)    Could not feed herself  . Carvedilol Other (See Comments)    Unknown     Medication list has been reviewed and updated.  Current Outpatient Prescriptions on File Prior to Visit  Medication Sig Dispense Refill  . alendronate (FOSAMAX) 70 MG tablet TAKE 1 TABLET EVERY 7 DAYS WITH A FULL GLASS OF WATER ON AN EMPTY STOMACH  4 tablet  11  . amLODipine (NORVASC) 5 MG tablet TAKE 1 TABLET BY MOUTH AT BEDTIME.  90 tablet  1  . aspirin 81 MG tablet Take 81 mg by mouth daily.      . B Complex-C (B-COMPLEX WITH VITAMIN C) tablet Take 1 tablet by mouth daily.      . calcium carbonate (OS-CAL) 600 MG TABS Take 600 mg by mouth daily. Takes 500  mg with 200mg  vitamin D      . Chlorpheniramine-Acetaminophen (CORICIDIN HBP COLD/FLU PO) Take 2 tablets by mouth once as needed. As needed for cold/flu symptoms.      . cyanocobalamin 500 MCG tablet Take 500 mcg by mouth daily.        Marland Kitchen. EPINEPHrine (EPIPEN) 0.3 mg/0.3 mL DEVI Inject 0.3 mg into the muscle as directed.        . hydrochlorothiazide (MICROZIDE) 12.5 MG capsule Take 1 capsule (12.5 mg total) by mouth daily.  90 capsule  1  . HYDROcodone-acetaminophen (NORCO) 5-325 MG per tablet Take 1 tablet by mouth every 6 (six) hours as needed for pain.  30 tablet  0  . metoprolol (LOPRESSOR) 50 MG tablet Take 0.5 tablets (25 mg total) by mouth 2 (two) times daily.  60 tablet  6  . pantoprazole (PROTONIX) 40 MG tablet Take 1 tablet (40 mg total) by mouth daily before breakfast.  90 tablet  2  . POTASSIUM GLUCONATE PO  Take by mouth.      . simvastatin (ZOCOR) 20 MG tablet TAKE 1 TABLET BY MOUTH AT BEDTIME.  90 tablet  1   No current facility-administered medications on file prior to visit.    Review of Systems:  As per HPI, otherwise negative.    Physical Examination: Filed Vitals:   07/14/13 1029  BP: 138/68  Pulse: 58  Temp: 97.9 F (36.6 C)  Resp: 14   Filed Vitals:   07/14/13 1029  Height: 5\' 2"  (1.575 m)  Weight: 153 lb (69.4 kg)   Body mass index is 27.98 kg/(m^2). Ideal Body Weight: Weight in (lb) to have BMI = 25: 136.4   GEN: WDWN, NAD, Non-toxic, Alert & Oriented x 3 HEENT: Atraumatic, Normocephalic.  Ears and Nose: No external deformity. EXTR: No clubbing/cyanosis/edema NEURO: hesitant gait. SLR negative normal plantar light tough.  PSYCH: Normally interactive. Conversant. Not depressed or anxious appearing.  Calm demeanor.  Left leg:  No ecchymosis, swelling or erythema.  DP and PT pulses diminished but sat cap refill.  No tenderness.    Assessment and Plan: Left leg pain Back pain Hypertension Follow up with family practice clinic Tramadol   Signed,  Phillips OdorJeffery Gunner Iodice, MD

## 2013-07-20 ENCOUNTER — Other Ambulatory Visit: Payer: Self-pay | Admitting: Family Medicine

## 2013-07-23 NOTE — Telephone Encounter (Signed)
Pt notified and is agreeable.  Bethaney Oshana L, CMA  

## 2013-07-23 NOTE — Telephone Encounter (Signed)
[facility], have not seen patient for OV. [facility] (Dr. Ashley RoyaltyMatthews). [facility] for [facility] pain with BP of 138/68.  Received refill request for HCTZ. Review of chart shows current BP meds include Amlodipine 5mg  daily, Metoprolol 25mg  BID, and HCTZ 12.5mg . Last provided refill HCTZ 12.5mg  (#90, 0 refills) in 11/2012 with plans for patient to follow-up for continued refills. Note prior documentation with concern for elevated serum creatinine last checked 05/18/12 at 1.71, above b/l 1.2 - 1.3.  Will not refill HCTZ at this time. Request that patient schedule [facility] apt [facility] for BP check and annual labs to check BMET and serum creatinine.  If you could, please call and notify patient of this information and advise schedule apt [facility] prior to receiving further refills.  Thank you, Saralyn PilarAlexander Dortha Neighbors, DO [facility], PGY-1

## 2013-08-21 ENCOUNTER — Encounter: Payer: Self-pay | Admitting: Family Medicine

## 2013-08-21 ENCOUNTER — Ambulatory Visit (INDEPENDENT_AMBULATORY_CARE_PROVIDER_SITE_OTHER): Payer: Medicare Other | Admitting: Family Medicine

## 2013-08-21 VITALS — BP 160/80 | HR 59 | Temp 97.8°F | Ht 62.0 in | Wt 154.8 lb

## 2013-08-21 DIAGNOSIS — M79605 Pain in left leg: Secondary | ICD-10-CM | POA: Insufficient documentation

## 2013-08-21 DIAGNOSIS — M79604 Pain in right leg: Secondary | ICD-10-CM

## 2013-08-21 DIAGNOSIS — M79609 Pain in unspecified limb: Secondary | ICD-10-CM

## 2013-08-21 DIAGNOSIS — I10 Essential (primary) hypertension: Secondary | ICD-10-CM

## 2013-08-21 LAB — COMPREHENSIVE METABOLIC PANEL
ALBUMIN: 4.2 g/dL (ref 3.5–5.2)
ALK PHOS: 56 U/L (ref 39–117)
ALT: 16 U/L (ref 0–35)
AST: 24 U/L (ref 0–37)
BUN: 15 mg/dL (ref 6–23)
CO2: 25 mEq/L (ref 19–32)
CREATININE: 1.32 mg/dL — AB (ref 0.50–1.10)
Calcium: 9.4 mg/dL (ref 8.4–10.5)
Chloride: 107 mEq/L (ref 96–112)
GLUCOSE: 94 mg/dL (ref 70–99)
Potassium: 4.6 mEq/L (ref 3.5–5.3)
Sodium: 142 mEq/L (ref 135–145)
Total Bilirubin: 0.6 mg/dL (ref 0.2–1.2)
Total Protein: 7.1 g/dL (ref 6.0–8.3)

## 2013-08-21 MED ORDER — GABAPENTIN 100 MG PO CAPS: 100.0000 mg | ORAL_CAPSULE | Freq: Three times a day (TID) | ORAL | Status: DC

## 2013-08-21 MED ORDER — GABAPENTIN 100 MG PO CAPS: 300.0000 mg | ORAL_CAPSULE | Freq: Every day | ORAL | Status: DC

## 2013-08-21 MED ORDER — HYDROCHLOROTHIAZIDE 12.5 MG PO CAPS: 12.5000 mg | ORAL_CAPSULE | Freq: Every day | ORAL | Status: DC

## 2013-08-21 MED ORDER — AMLODIPINE BESYLATE 10 MG PO TABS: 10.0000 mg | ORAL_TABLET | Freq: Every day | ORAL | Status: DC

## 2013-08-21 NOTE — Assessment & Plan Note (Addendum)
Elevated BP today, improved on re-check 160/80  - Did not take HCTZ today, only taking 12.5mg  qod d/t urination - Prior home BPs better. Suspect recent 1 week severe inc stress with sick husband contributing No complications   Plan:  1. Increase Amlodipine 5 to 10mg  daily, advised taking HCTZ 12.5mg  daily (not qod), continue Metoprolol for now. Note allergy to ACEi 2. Check CMET, to monitor electrolytes and Cr, last checked 05/2012 with elevated Cr 3. Monitor BP at home or at drug store occasionally. 4. Goal BP < 150/90, consider DC metoprolol if controlled on current regimen 5. RTC 1 month, BP check
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
## 2013-08-21 NOTE — Patient Instructions (Signed)
Dear Destiny Moore, Thank you for coming in to clinic today. It was good to meet you!  Today we discussed your Blood Pressure and Leg Pain. 1. For your leg pain, I have prescribed Gabapentin, which treats nerve pain. Please start with 100mg  about 2 hours before going to bed for 3 days, then increase to 200mg  for 3 days, and then up to 300mg  (3 capsules) 2 hours prior to bed. 2. For your Blood pressure, I have increased your Amlodipine to 10mg , please take 1 tablet daily. Also, encourage you to take the HCTZ (fluid pill) every day. Keep checking your blood pressure at home.  Please schedule a follow-up appointment with me in 1 month to follow-up Blood Pressure  If you have any other questions or concerns, please feel free to call the clinic to contact me. You may also schedule an earlier appointment if necessary.  However, if your symptoms get significantly worse, please go to the Emergency Department to seek immediate medical attention.  Saralyn PilarAlexander Darcie Mellone, DO [facility]

## 2013-08-21 NOTE — Assessment & Plan Note (Signed)
Suspect neuropathic pain in bilateral LE, given chronic nature and significant hx of prior bilateral LE injuries, likely peripheral nerve damage. Consider possible DM, with prior mild elevated glucose on labs, and hx frequent urination.  Plan: 1. Start trial of Gabapentin 300mg  nightly (titrate up 100mg  x 3 days, 200mg  x 3 days, then 300mg ), if improved can increase dosing frequency and titrate as needed. 2. Check CMET for glucose, if elevated would have return for fasting glucose 3. Consider future imaging of bilateral feet / ankles

## 2013-09-12 ENCOUNTER — Encounter: Payer: Self-pay | Admitting: Family Medicine

## 2013-09-20 ENCOUNTER — Ambulatory Visit (INDEPENDENT_AMBULATORY_CARE_PROVIDER_SITE_OTHER): Payer: Medicare Other | Admitting: Family Medicine

## 2013-09-20 ENCOUNTER — Encounter: Payer: Self-pay | Admitting: Family Medicine

## 2013-09-20 VITALS — BP 156/80 | HR 61 | Temp 98.2°F | Wt 156.7 lb

## 2013-09-20 DIAGNOSIS — M79604 Pain in right leg: Secondary | ICD-10-CM

## 2013-09-20 DIAGNOSIS — N318 Other neuromuscular dysfunction of bladder: Secondary | ICD-10-CM

## 2013-09-20 DIAGNOSIS — M79605 Pain in left leg: Secondary | ICD-10-CM

## 2013-09-20 DIAGNOSIS — I10 Essential (primary) hypertension: Secondary | ICD-10-CM

## 2013-09-20 DIAGNOSIS — M79609 Pain in unspecified limb: Secondary | ICD-10-CM

## 2013-09-20 DIAGNOSIS — N3281 Overactive bladder: Secondary | ICD-10-CM | POA: Insufficient documentation

## 2013-09-20 MED ORDER — TRAMADOL HCL 50 MG PO TABS: 50.0000 mg | ORAL_TABLET | Freq: Three times a day (TID) | ORAL | Status: DC | PRN

## 2013-09-20 NOTE — Progress Notes (Signed)
Patient ID: Destiny Moore, female   DOB: August 11, 1932, 80 y.o.   MRN: [id number] Subjective:    HPI  CHRONIC HTN: Reports - checking BP daily at home (electronic wrist cuff), 110-120s/50-70s, stays well hydrated - c/o of one episode of low BP 92/43, lightheaded and dizzy attempting to stand up from bed. Denied any h/o LOC, improved with rest. Current Meds: Amlodipine 10mg  (from 5mg ) qd, HCTZ 12.5mg  qd (from qod), Metoprolol 25mg  BID Reports good compliance. Complains of frequent urination following HCTZ use (however, no change from qod to qd) Denies CP, dyspnea, HA, edema, dizziness / lightheadedness  LEG PAIN / PERIPHERAL NEUROPATHY: - Significant improvement with reduced bilateral lower leg sharp shooting pains and tingling, since starting Gabapentin on 08/21/13, has successfully titrated up from 100mg  to 300mg  nightly - Reported able to sleep much better now, previously pain kept her up at night, wakes up during night much less, also less nocturia now - Denies significant pain in legs during the day, no limit on ambulation - Admits bilateral lower extremity pain (improved) and tingling, occasional swelling  URINARY FREQUENCY / INCONTINENCE: - Hx chronic urinary symptoms >5 years - Previously followed by Urology, dx with Overactive Bladder - Trial of meds, likely anticholinergics without much success, DC'd d/t constipation - Wears pads  PMH: - Denies hx heart disease, no CAD, CHF  Additionally: Reported recent URI illness with coughing and fever, with +sick contact (husband). Since resolved, currently feels like she is back to baseline.  I have reviewed and updated the following as appropriate: allergies and current medications  Social Hx: Never smoker  Review of Systems  See above HPI     Objective:   Physical Exam  BP 156/80  Pulse 61  Temp(Src) 98.2 F (36.8 C) (Oral)  Wt 156 lb 11.2 oz (71.079 kg)  Gen - pleasant, well-appearing elderly female, NAD Heart - RRR,  no murmurs heard Lungs - CTAB, nml effort Abd - soft, NTND, no masses, +active BS Ext - b/l LE +1 pitting edema R>L mostly localized to feet / ankles, peripheral pulses intact +2 b/l Skin - warm, dry, no rashes Neuro - awake, alert, oriented, grossly non-focal, intact muscle strength 5/5 b/l, intact distal sensation to light touch, gait normal     Assessment:     See specific A&P problem list for details.      Plan:     See specific A&P problem list for details.

## 2013-09-20 NOTE — Assessment & Plan Note (Addendum)
Improved BP today, initial 156/80, manual re-check at 124/70 - Took all BP meds today - Note review of home BP recordings all normotensive 110-120s, with infrequent low-normal SBP 90s - Concern for at risk hypotension / orthostasis No complications   Plan:  1. Continue Amlodipine 10mg  daily, HCTZ 12.5mg  daily 2. Discontinue Metoprolol 25mg  BID - No hx heart disease, HR 61 today, review pt recorded home vitals HR 60s 3. CMET last checked 08/21/13, Cr 1.32 returned to b/l 4. Continue monitor BP at home 3x weekly 5. If BP in future remains controlled (goal < 150/90), consider decreasing Amlodipine back to 5mg  daily vs DC HCTZ 6. RTC 4-6 months

## 2013-09-20 NOTE — Assessment & Plan Note (Addendum)
Previously followed by Urology, prior dx with overactive bladder with urinary frequency and occasional incontinence - Failed trial of meds in past, difficulty with constipation - Not interested in repeat trial of medications  Plan: 1. Advised on behavioral techniques / frequent bladder emptying strategies to avoid incontinence 2. Continue to wear pads 3. Consider Myrbetriq in future if interested

## 2013-09-20 NOTE — Patient Instructions (Signed)
Dear Herma Mering, Thank you for coming in to clinic today. It was good to see you again!  Today we discussed your Blood Pressure and Leg Pain. 1. For your blood pressure, I recommend to stop taking Metoprolol 50mg  (half tab twice daily). Continue taking Amlodipine 10mg  daily and Hydrochlorothiazide 12.5mg  daily 2. Keep checking your blood pressure at least 3 times weekly, write them down as you have (it was very helpful). Also, check your pressure if you have another episode 3. For your leg pain, keep taking the Gabapentin 300mg  nightly, you may take 1 or 2 additional capsules during the day as needed. 4. I have refilled your Tramadol, take it as needed  Some important numbers from today's visit: BP - 156/80  Please schedule a follow-up appointment with me in 4 to 6 months or sooner if needed.  If you have any other questions or concerns, please feel free to call the clinic to contact me. You may also schedule an earlier appointment if necessary.  However, if your symptoms get significantly worse, please go to the Emergency Department to seek immediate medical attention.  Saralyn Pilar, DO [facility]

## 2013-09-20 NOTE — Assessment & Plan Note (Addendum)
Improved s/p starting Gabapentin, consistent with peripheral neuropathy, previously noted from likely bilateral LE injuries with possible peripheral nerve damage. - Significantly improved pain, previously primarily night-time and was affecting sleep - Unlikely DM, glucose on metabolic screening normal, last 94  Plan: 1. Continue Gabapentin 300mg  nightly - discussed may take up to TID if needed, also may consider increasing nightly dose in future. 2. Continue to monitor 3. Refill Tramadol 50mg  (#30, 0 refill), patient takes on rare occasion if pain is severe

## 2013-09-27 ENCOUNTER — Other Ambulatory Visit: Payer: Self-pay | Admitting: *Deleted

## 2013-09-27 DIAGNOSIS — M79604 Pain in right leg: Secondary | ICD-10-CM

## 2013-09-27 DIAGNOSIS — M79605 Pain in left leg: Principal | ICD-10-CM

## 2013-09-27 MED ORDER — ALENDRONATE SODIUM 70 MG PO TABS: ORAL_TABLET | ORAL | Status: DC

## 2013-09-27 MED ORDER — GABAPENTIN 100 MG PO CAPS: 300.0000 mg | ORAL_CAPSULE | Freq: Every day | ORAL | Status: DC

## 2013-10-22 ENCOUNTER — Other Ambulatory Visit: Payer: Self-pay | Admitting: *Deleted

## 2013-11-18 ENCOUNTER — Encounter: Payer: Self-pay | Admitting: Family Medicine

## 2013-11-18 ENCOUNTER — Ambulatory Visit (INDEPENDENT_AMBULATORY_CARE_PROVIDER_SITE_OTHER): Payer: Medicare Other | Admitting: Family Medicine

## 2013-11-18 VITALS — BP 143/83 | HR 82 | Temp 98.2°F | Wt 154.2 lb

## 2013-11-18 DIAGNOSIS — Z01818 Encounter for other preprocedural examination: Secondary | ICD-10-CM

## 2013-11-18 NOTE — Patient Instructions (Signed)
It was nice to meet you today!  From a medical standpoint, you are cleared for cataract surgery. Follow up with Dr. Althea CharonKaramalegos in 2 months for your chronic medical problems.  Be well, Dr. Pollie MeyerMcIntyre

## 2013-11-18 NOTE — Progress Notes (Signed)
Patient ID: Destiny Moore, female   DOB: [birth year], 80 y.o.   MRN: [id number]  HPI:  Pt presents today for preoperative evaluation prior to cataract surgery. She has an upcoming appointment with a cataract surgeon and states she needs to put on a form that she has seen a medical doctor prior to the surgery.  She has no other complaints today. Denies any problems with chest pain, shortness of breath. Has stable chronic lower extremity swelling. No hx of cardiac problems, previously saw a cardiologist who told her to just follow up as needed. Has well controlled HTN.   We did a full medication review and updated her medication list. Had CMET done in April 2015 which was unremarkable and showed stable renal function.  ROS: See HPI  PMFSH: hx HLD, HTN, OA, CKD stage 3, overactive bladder  PHYSICAL EXAM: BP 143/83  Pulse 82  Temp(Src) 98.2 F (36.8 C) (Oral)  Wt 154 lb 3.2 oz (69.945 kg) Gen: NAD HEENT: NCAT Heart: RRR, no murmurs Lungs: CTAB, NWOB Neuro: grossly nonfocal, speech normal Ext: mild nonpitting edema over bilat lower ext, no tenderness to palpation of calves  ASSESSMENT/PLAN:  1. Cataract surgery operative clearance: no contraindications to cataract surgery identified today. Advised pt if she needs a form completed by us or a letter mailed to her surgeon, to call and let our office know. She will f/u with her PCP in 2 months for her chronic medical problems.   FOLLOW UP: F/u in 2 months for chronic medical problems with PCP.  GrenadaBrittany J. Pollie MeyerMcIntyre, MD [facility]

## 2013-11-21 ENCOUNTER — Other Ambulatory Visit: Payer: Self-pay | Admitting: Family Medicine

## 2014-01-09 ENCOUNTER — Other Ambulatory Visit: Payer: Self-pay | Admitting: Family Medicine

## 2014-02-12 ENCOUNTER — Other Ambulatory Visit: Payer: Self-pay | Admitting: Family Medicine

## 2014-02-27 ENCOUNTER — Emergency Department (HOSPITAL_COMMUNITY)
Admission: EM | Admit: 2014-02-27 | Discharge: 2014-02-27 | Disposition: A | Discharge status: Home / Self Care | Payer: Medicare Other | Attending: Emergency Medicine | Admitting: Emergency Medicine

## 2014-02-27 ENCOUNTER — Emergency Department (HOSPITAL_COMMUNITY): Payer: Medicare Other

## 2014-02-27 ENCOUNTER — Encounter (HOSPITAL_COMMUNITY): Payer: Self-pay | Admitting: Emergency Medicine

## 2014-02-27 DIAGNOSIS — R197 Diarrhea, unspecified: Secondary | ICD-10-CM | POA: Diagnosis not present

## 2014-02-27 DIAGNOSIS — R112 Nausea with vomiting, unspecified: Secondary | ICD-10-CM | POA: Diagnosis not present

## 2014-02-27 DIAGNOSIS — R109 Unspecified abdominal pain: Secondary | ICD-10-CM | POA: Diagnosis not present

## 2014-02-27 DIAGNOSIS — F419 Anxiety disorder, unspecified: Secondary | ICD-10-CM | POA: Diagnosis not present

## 2014-02-27 DIAGNOSIS — E785 Hyperlipidemia, unspecified: Secondary | ICD-10-CM | POA: Diagnosis not present

## 2014-02-27 DIAGNOSIS — M810 Age-related osteoporosis without current pathological fracture: Secondary | ICD-10-CM | POA: Diagnosis not present

## 2014-02-27 DIAGNOSIS — Z87440 Personal history of urinary (tract) infections: Secondary | ICD-10-CM | POA: Diagnosis not present

## 2014-02-27 DIAGNOSIS — Z7982 Long term (current) use of aspirin: Secondary | ICD-10-CM | POA: Insufficient documentation

## 2014-02-27 DIAGNOSIS — Z90710 Acquired absence of both cervix and uterus: Secondary | ICD-10-CM | POA: Diagnosis not present

## 2014-02-27 DIAGNOSIS — F329 Major depressive disorder, single episode, unspecified: Secondary | ICD-10-CM | POA: Diagnosis not present

## 2014-02-27 DIAGNOSIS — I10 Essential (primary) hypertension: Secondary | ICD-10-CM | POA: Diagnosis not present

## 2014-02-27 DIAGNOSIS — Z79899 Other long term (current) drug therapy: Secondary | ICD-10-CM | POA: Insufficient documentation

## 2014-02-27 LAB — CBC WITH DIFFERENTIAL/PLATELET
BASOS ABS: 0 10*3/uL (ref 0.0–0.1)
Basophils Relative: 0 % (ref 0–1)
EOS PCT: 0 % (ref 0–5)
Eosinophils Absolute: 0 10*3/uL (ref 0.0–0.7)
HCT: 39.3 % (ref 36.0–46.0)
Hemoglobin: 13.2 g/dL (ref 12.0–15.0)
Lymphocytes Relative: 7 % — ABNORMAL LOW (ref 12–46)
Lymphs Abs: 0.7 10*3/uL (ref 0.7–4.0)
MCH: 29.9 pg (ref 26.0–34.0)
MCHC: 33.6 g/dL (ref 30.0–36.0)
MCV: 88.9 fL (ref 78.0–100.0)
Monocytes Absolute: 0.6 10*3/uL (ref 0.1–1.0)
Monocytes Relative: 5 % (ref 3–12)
Neutro Abs: 9.7 10*3/uL — ABNORMAL HIGH (ref 1.7–7.7)
Neutrophils Relative %: 88 % — ABNORMAL HIGH (ref 43–77)
Platelets: 232 10*3/uL (ref 150–400)
RBC: 4.42 MIL/uL (ref 3.87–5.11)
RDW: 13.4 % (ref 11.5–15.5)
WBC: 11 10*3/uL — ABNORMAL HIGH (ref 4.0–10.5)

## 2014-02-27 LAB — COMPREHENSIVE METABOLIC PANEL
ALBUMIN: 3.9 g/dL (ref 3.5–5.2)
ALT: 19 U/L (ref 0–35)
AST: 26 U/L (ref 0–37)
Alkaline Phosphatase: 68 U/L (ref 39–117)
Anion gap: 17 — ABNORMAL HIGH (ref 5–15)
BUN: 31 mg/dL — ABNORMAL HIGH (ref 6–23)
CALCIUM: 9.2 mg/dL (ref 8.4–10.5)
CO2: 21 mEq/L (ref 19–32)
CREATININE: 1.7 mg/dL — AB (ref 0.50–1.10)
Chloride: 104 mEq/L (ref 96–112)
GFR calc Af Amer: 32 mL/min — ABNORMAL LOW (ref >90)
GFR, EST NON AFRICAN AMERICAN: 27 mL/min — AB (ref >90)
Glucose, Bld: 180 mg/dL — ABNORMAL HIGH (ref 70–99)
Potassium: 3.1 mEq/L — ABNORMAL LOW (ref 3.7–5.3)
Sodium: 142 mEq/L (ref 137–147)
Total Bilirubin: 0.4 mg/dL (ref 0.3–1.2)
Total Protein: 7.3 g/dL (ref 6.0–8.3)

## 2014-02-27 LAB — URINALYSIS, ROUTINE W REFLEX MICROSCOPIC
Bilirubin Urine: NEGATIVE
Glucose, UA: NEGATIVE mg/dL
Hgb urine dipstick: NEGATIVE
Ketones, ur: NEGATIVE mg/dL
Nitrite: NEGATIVE
PH: 5.5 (ref 5.0–8.0)
PROTEIN: NEGATIVE mg/dL
Specific Gravity, Urine: 1.02 (ref 1.005–1.030)
Urobilinogen, UA: 0.2 mg/dL (ref 0.0–1.0)

## 2014-02-27 LAB — URINE MICROSCOPIC-ADD ON

## 2014-02-27 MED ORDER — MORPHINE SULFATE 4 MG/ML IJ SOLN
4.0000 mg | Freq: Once | INTRAMUSCULAR | Status: AC
  Administered 2014-02-27: 4 mg via INTRAVENOUS
  Filled 2014-02-27: qty 1

## 2014-02-27 MED ORDER — IOHEXOL 300 MG/ML  SOLN
25.0000 mL | INTRAMUSCULAR | Status: AC
  Administered 2014-02-27: 25 mL via ORAL

## 2014-02-27 MED ORDER — LOPERAMIDE HCL 2 MG PO CAPS: 2.0000 mg | ORAL_CAPSULE | ORAL | Status: DC | PRN

## 2014-02-27 MED ORDER — ONDANSETRON HCL 4 MG PO TABS: 4.0000 mg | ORAL_TABLET | Freq: Three times a day (TID) | ORAL | Status: DC | PRN

## 2014-02-27 MED ORDER — ONDANSETRON HCL 4 MG/2ML IJ SOLN
4.0000 mg | Freq: Once | INTRAMUSCULAR | Status: AC
Start: 2014-02-27 — End: 2014-02-27
  Administered 2014-02-27: 4 mg via INTRAVENOUS
  Filled 2014-02-27: qty 2

## 2014-02-27 MED ORDER — LOPERAMIDE HCL 2 MG PO CAPS
4.0000 mg | ORAL_CAPSULE | Freq: Once | ORAL | Status: AC
  Administered 2014-02-27: 4 mg via ORAL
  Filled 2014-02-27: qty 2

## 2014-02-27 NOTE — ED Notes (Signed)
Pt. reports emesis and diarrhea with chills and body aches onset 2 days ago.

## 2014-02-27 NOTE — Discharge Instructions (Signed)
You were seen today for nausea, vomiting, diarrhea, and abdominal pain. Your workup is largely reassuring including CT scan. Suspect that you may have a virus. He will be treated symptomatically with Imodium and Zofran. If symptoms worsen, you develop worsening pain, cannot tolerate fluids, develop fever or any new symptoms you should be reevaluated immediately.  Viral Gastroenteritis Viral gastroenteritis is also known as stomach flu. This condition affects the stomach and intestinal tract. It can cause sudden diarrhea and vomiting. The illness typically lasts 3 to 8 days. Most people develop an immune response that eventually gets rid of the virus. While this natural response develops, the virus can make you quite ill. CAUSES  Many different viruses can cause gastroenteritis, such as rotavirus or noroviruses. You can catch one of these viruses by consuming contaminated food or water. You may also catch a virus by sharing utensils or other personal items with an infected person or by touching a contaminated surface. SYMPTOMS  The most common symptoms are diarrhea and vomiting. These problems can cause a severe loss of body fluids (dehydration) and a body salt (electrolyte) imbalance. Other symptoms may include:  Fever.  Headache.  Fatigue.  Abdominal pain. DIAGNOSIS  Your caregiver can usually diagnose viral gastroenteritis based on your symptoms and a physical exam. A stool sample may also be taken to test for the presence of viruses or other infections. TREATMENT  This illness typically goes away on its own. Treatments are aimed at rehydration. The most serious cases of viral gastroenteritis involve vomiting so severely that you are not able to keep fluids down. In these cases, fluids must be given through an intravenous line (IV). HOME CARE INSTRUCTIONS   Drink enough fluids to keep your urine clear or pale yellow. Drink small amounts of fluids frequently and increase the amounts as  tolerated.  Ask your caregiver for specific rehydration instructions.  Avoid:  Foods high in sugar.  Alcohol.  Carbonated drinks.  Tobacco.  Juice.  Caffeine drinks.  Extremely hot or cold fluids.  Fatty, greasy foods.  Too much intake of anything at one time.  Dairy products until 24 to 48 hours after diarrhea stops.  You may consume probiotics. Probiotics are active cultures of beneficial bacteria. They may lessen the amount and number of diarrheal stools in adults. Probiotics can be found in yogurt with active cultures and in supplements.  Wash your hands well to avoid spreading the virus.  Only take over-the-counter or prescription medicines for pain, discomfort, or fever as directed by your caregiver. Do not give aspirin to children. Antidiarrheal medicines are not recommended.  Ask your caregiver if you should continue to take your regular prescribed and over-the-counter medicines.  Keep all follow-up appointments as directed by your caregiver. SEEK IMMEDIATE MEDICAL CARE IF:   You are unable to keep fluids down.  You do not urinate at least once every 6 to 8 hours.  You develop shortness of breath.  You notice blood in your stool or vomit. This may look like coffee grounds.  You have abdominal pain that increases or is concentrated in one small area (localized).  You have persistent vomiting or diarrhea.  You have a fever.  The patient is a child younger than 3 months, and he or she has a fever.  The patient is a child older than 3 months, and he or she has a fever and persistent symptoms.  The patient is a child older than 3 months, and he or she has a fever  and symptoms suddenly get worse.  The patient is a baby, and he or she has no tears when crying. MAKE SURE YOU:   Understand these instructions.  Will watch your condition.  Will get help right away if you are not doing well or get worse. Document Released: 04/18/2005 Document Revised:  07/11/2011 Document Reviewed: 02/02/2011 [facility] Patient Information 2015 [employer] [place]. This information is not intended to replace advice given to you by your health care provider. Make sure you discuss any questions you have with your health care provider.

## 2014-02-27 NOTE — ED Provider Notes (Signed)
CSN: [id number]     Arrival date & time 02/27/14  0209 History   First MD Initiated Contact with Patient 02/27/14 [phone]     Chief Complaint  Patient presents with  . Emesis  . Diarrhea     (Consider location/radiation/quality/duration/timing/severity/associated sxs/prior Treatment) HPI  This is a 80 year old female with a history of hypertension, hyperlipidemia who presents with nausea, vomiting, diarrhea. Patient reports symptoms for 2 days. Denies any bloody or dark stools. Reports crampy abdominal pain which is worse when she "has to go to the bathroom." Reports generalized weakness but denies dizziness or syncope. Denies any fevers. Currently denies any abdominal pain. No known sick contacts.  Past Medical History  Diagnosis Date  . Hypertension   . Hyperlipidemia   . Anxiety   . UTI (urinary tract infection)   . Depression   . Osteoporosis   . Allergy    Past Surgical History  Procedure Laterality Date  . Hystrectomy    . Bladder tact    . Abdominal hysterectomy    . Incontinence surgery     Family History  Problem Relation Age of Onset  . Heart disease Mother   . Cancer Sister     breast  . Cancer Brother     skin, kidney, lung   History  Substance Use Topics  . Smoking status: Never Smoker   . Smokeless tobacco: Never Used  . Alcohol Use: No   OB History   Grav Para Term Preterm Abortions TAB SAB Ect Mult Living                 Review of Systems  Constitutional: Negative for fever.  Respiratory: Negative for cough, chest tightness and shortness of breath.   Cardiovascular: Negative for chest pain.  Gastrointestinal: Positive for nausea, vomiting, abdominal pain and diarrhea. Negative for blood in stool.  Genitourinary: Negative for dysuria.  Musculoskeletal: Negative for back pain.  Neurological: Negative for dizziness and speech difficulty.  Psychiatric/Behavioral: Negative for confusion.  All other systems reviewed and are  negative.     Allergies  Coreg; Lisinopril; Sertraline hcl; and Carvedilol  Home Medications   Prior to Admission medications   Medication Sig Start Date End Date Taking? Authorizing Provider  alendronate (FOSAMAX) 70 MG tablet Take 70 mg by mouth once a week. Take with a full glass of water on an empty stomach. On Monday   Yes Historical Provider, MD  amLODipine (NORVASC) 10 MG tablet Take 10 mg by mouth daily.   Yes Historical Provider, MD  aspirin 81 MG tablet Take 81 mg by mouth daily.   Yes Historical Provider, MD  B Complex-C (B-COMPLEX WITH VITAMIN C) tablet Take 1 tablet by mouth daily.   Yes Historical Provider, MD  EPINEPHrine (EPIPEN) 0.3 mg/0.3 mL DEVI Inject 0.3 mg into the muscle as needed (for allergic reaction).    Yes Historical Provider, MD  gabapentin (NEURONTIN) 100 MG capsule Take 3 capsules (300 mg total) by mouth at bedtime. 09/27/13  Yes Alexander Althea Charon, DO  hydrochlorothiazide (MICROZIDE) 12.5 MG capsule Take 12.5 mg by mouth daily.   Yes Historical Provider, MD  pantoprazole (PROTONIX) 40 MG tablet Take 40 mg by mouth daily.   Yes Historical Provider, MD  simvastatin (ZOCOR) 20 MG tablet Take 20 mg by mouth daily.   Yes Historical Provider, MD  traMADol (ULTRAM) 50 MG tablet Take 1 tablet (50 mg total) by mouth every 8 (eight) hours as needed. 09/20/13  Yes Saralyn Pilar, DO  loperamide (  IMODIUM) 2 MG capsule Take 1 capsule (2 mg total) by mouth as needed for diarrhea or loose stools. 02/27/14   Shon Batonourtney F Kavir Savoca, MD  ondansetron (ZOFRAN) 4 MG tablet Take 1 tablet (4 mg total) by mouth every 8 (eight) hours as needed for nausea or vomiting. 02/27/14   Shon Batonourtney F Delesia Martinek, MD   BP 134/66  Pulse 90  Resp 18  SpO2 96% Physical Exam  Nursing note and vitals reviewed. Constitutional: She is oriented to person, place, and time. No distress.  Elderly  HENT:  Head: Normocephalic and atraumatic.  Mucous membranes moist  Eyes: Pupils are equal, round,  and reactive to light.  Neck: Neck supple.  Cardiovascular: Normal rate, regular rhythm and normal heart sounds.   Pulmonary/Chest: Effort normal and breath sounds normal. No respiratory distress. She has no wheezes.  Abdominal: Soft. Bowel sounds are normal. There is no tenderness. There is no rebound and no guarding.  Musculoskeletal: She exhibits no edema.  Neurological: She is alert and oriented to person, place, and time.  Skin: Skin is warm and dry.  Psychiatric: She has a normal mood and affect.    ED Course  Procedures (including critical care time) Labs Review Labs Reviewed  CBC WITH DIFFERENTIAL - Abnormal; Notable for the following:    WBC 11.0 (*)    Neutrophils Relative % 88 (*)    Neutro Abs 9.7 (*)    Lymphocytes Relative 7 (*)    All other components within normal limits  COMPREHENSIVE METABOLIC PANEL - Abnormal; Notable for the following:    Potassium 3.1 (*)    Glucose, Bld 180 (*)    BUN 31 (*)    Creatinine, Ser 1.70 (*)    GFR calc non Af Amer 27 (*)    GFR calc Af Amer 32 (*)    Anion gap 17 (*)    All other components within normal limits  URINALYSIS, ROUTINE W REFLEX MICROSCOPIC - Abnormal; Notable for the following:    APPearance CLOUDY (*)    Leukocytes, UA SMALL (*)    All other components within normal limits  URINE MICROSCOPIC-ADD ON    Imaging Review Ct Abdomen Pelvis Wo Contrast  02/27/2014   CLINICAL DATA:  Nausea and vomiting for 2 days with abdominal pain  EXAM: CT ABDOMEN AND PELVIS WITHOUT CONTRAST  TECHNIQUE: Multidetector CT imaging of the abdomen and pelvis was performed following the standard protocol without IV contrast.  COMPARISON:  05/12/2012  FINDINGS: The lung bases show mild dependent atelectasis without sizable effusion. There is contrast material noted in the distal esophagus which may be related to reflux. A small sliding-type hiatal hernia is noted.  The liver and spleen are again within normal limits. The gallbladder remains  distended. Some rim-like calcification is noted in the neck of the gallbladder consistent with cholelithiasis. Common bile duct is prominent measuring 17 mm although this is stable from the prior exam. No definitive calculus is identified. The adrenal glands and pancreas are unremarkable.  The kidneys are well visualized and demonstrates some lobulation likely related to scarring. No renal calculi or urinary tract obstructive changes are seen. Small peripelvic cysts are noted bilaterally.  Diffuse aortic calcifications are noted without aneurysmal dilatation. The appendix is not visualized although no inflammatory changes to suggest appendicitis are noted. Fluid is noted throughout the colon consistent with the given clinical history of diarrhea. Diffuse diverticulosis is noted without evidence of diverticulitis. The bladder is partially distended. No pelvic mass lesion or sidewall  abnormality is noted. The uterus has been surgically removed.  IMPRESSION: Changes consistent with cholelithiasis which are stable from the prior exams.  Common bile duct dilatation is 17 mm which is stable from the prior exam. No definitive distal stone is seen.  Changes consistent with esophageal reflux.  Diverticulosis without diverticulitis.   Electronically Signed   By: Alcide CleverMark  Lukens M.D.   On: 02/27/2014 07:17     EKG Interpretation None      MDM   Final diagnoses:  Abdominal pain  Nausea vomiting and diarrhea    She presents with abdominal pain, nausea, vomiting, and diarrhea 2 days. She is nontoxic on exam.  Mucous membranes are moist. Vital signs stable.  Lab work obtained. Mild leukocytosis to 11. CMP at patient's baseline. There are 7-10 white cells in her urine and rare bacteria but patient is without skin dysuria. Patient was symptomatically treated. CT scan of the abdomen and pelvis obtained to evaluate for diverticulitis.  CT scan unchanged from prior.  On recheck, patient appears comfortable. Vital signs  remained stable. She was able to tolerate liquids. Suspect possible viral etiology. Discussed with patient and family symptomatic support at home. Given strict return precautions.  After history, exam, and medical workup I feel the patient has been appropriately medically screened and is safe for discharge home. Pertinent diagnoses were discussed with the patient. Patient was given return precautions.     Shon Batonourtney F Jashawna Reever, MD 02/27/14 [phone]

## 2014-02-27 NOTE — ED Notes (Signed)
Patient transported to CT 

## 2014-04-01 ENCOUNTER — Other Ambulatory Visit: Payer: Self-pay | Admitting: Family Medicine

## 2014-04-08 ENCOUNTER — Emergency Department (HOSPITAL_COMMUNITY)
Admission: EM | Admit: 2014-04-08 | Discharge: 2014-04-08 | Disposition: A | Discharge status: Home / Self Care | Payer: Medicare Other | Attending: Emergency Medicine | Admitting: Emergency Medicine

## 2014-04-08 ENCOUNTER — Other Ambulatory Visit: Payer: Self-pay

## 2014-04-08 ENCOUNTER — Encounter (HOSPITAL_COMMUNITY): Payer: Self-pay | Admitting: Emergency Medicine

## 2014-04-08 ENCOUNTER — Emergency Department (HOSPITAL_COMMUNITY): Payer: Medicare Other

## 2014-04-08 DIAGNOSIS — Z7982 Long term (current) use of aspirin: Secondary | ICD-10-CM | POA: Insufficient documentation

## 2014-04-08 DIAGNOSIS — M810 Age-related osteoporosis without current pathological fracture: Secondary | ICD-10-CM | POA: Diagnosis not present

## 2014-04-08 DIAGNOSIS — R079 Chest pain, unspecified: Secondary | ICD-10-CM | POA: Insufficient documentation

## 2014-04-08 DIAGNOSIS — E785 Hyperlipidemia, unspecified: Secondary | ICD-10-CM | POA: Insufficient documentation

## 2014-04-08 DIAGNOSIS — I10 Essential (primary) hypertension: Secondary | ICD-10-CM | POA: Diagnosis not present

## 2014-04-08 DIAGNOSIS — R110 Nausea: Secondary | ICD-10-CM | POA: Diagnosis not present

## 2014-04-08 DIAGNOSIS — K219 Gastro-esophageal reflux disease without esophagitis: Secondary | ICD-10-CM | POA: Insufficient documentation

## 2014-04-08 DIAGNOSIS — M545 Low back pain: Secondary | ICD-10-CM | POA: Diagnosis not present

## 2014-04-08 DIAGNOSIS — Z79899 Other long term (current) drug therapy: Secondary | ICD-10-CM | POA: Diagnosis not present

## 2014-04-08 DIAGNOSIS — R0789 Other chest pain: Secondary | ICD-10-CM

## 2014-04-08 DIAGNOSIS — F419 Anxiety disorder, unspecified: Secondary | ICD-10-CM | POA: Diagnosis not present

## 2014-04-08 DIAGNOSIS — F329 Major depressive disorder, single episode, unspecified: Secondary | ICD-10-CM | POA: Diagnosis not present

## 2014-04-08 DIAGNOSIS — Z87440 Personal history of urinary (tract) infections: Secondary | ICD-10-CM | POA: Insufficient documentation

## 2014-04-08 LAB — CBC
HCT: 37.5 % (ref 36.0–46.0)
Hemoglobin: 12.7 g/dL (ref 12.0–15.0)
MCH: 30.5 pg (ref 26.0–34.0)
MCHC: 33.9 g/dL (ref 30.0–36.0)
MCV: 90.1 fL (ref 78.0–100.0)
PLATELETS: 234 10*3/uL (ref 150–400)
RBC: 4.16 MIL/uL (ref 3.87–5.11)
RDW: 13.1 % (ref 11.5–15.5)
WBC: 6.1 10*3/uL (ref 4.0–10.5)

## 2014-04-08 LAB — COMPREHENSIVE METABOLIC PANEL
ALT: 14 U/L (ref 0–35)
ANION GAP: 18 — AB (ref 5–15)
AST: 24 U/L (ref 0–37)
Albumin: 3.7 g/dL (ref 3.5–5.2)
Alkaline Phosphatase: 65 U/L (ref 39–117)
BILIRUBIN TOTAL: 0.4 mg/dL (ref 0.3–1.2)
BUN: 18 mg/dL (ref 6–23)
CO2: 22 meq/L (ref 19–32)
CREATININE: 1.56 mg/dL — AB (ref 0.50–1.10)
Calcium: 9.5 mg/dL (ref 8.4–10.5)
Chloride: 99 mEq/L (ref 96–112)
GFR calc Af Amer: 35 mL/min — ABNORMAL LOW (ref >90)
GFR, EST NON AFRICAN AMERICAN: 30 mL/min — AB (ref >90)
Glucose, Bld: 150 mg/dL — ABNORMAL HIGH (ref 70–99)
Potassium: 3.7 mEq/L (ref 3.7–5.3)
Sodium: 139 mEq/L (ref 137–147)
Total Protein: 7.1 g/dL (ref 6.0–8.3)

## 2014-04-08 LAB — PRO B NATRIURETIC PEPTIDE: PRO B NATRI PEPTIDE: 329.5 pg/mL (ref 0–450)

## 2014-04-08 LAB — URINALYSIS, ROUTINE W REFLEX MICROSCOPIC
Bilirubin Urine: NEGATIVE
Glucose, UA: NEGATIVE mg/dL
Hgb urine dipstick: NEGATIVE
Ketones, ur: NEGATIVE mg/dL
Leukocytes, UA: NEGATIVE
Nitrite: NEGATIVE
Protein, ur: NEGATIVE mg/dL
Specific Gravity, Urine: 1.005 (ref 1.005–1.030)
Urobilinogen, UA: 0.2 mg/dL (ref 0.0–1.0)
pH: 7 (ref 5.0–8.0)

## 2014-04-08 LAB — I-STAT TROPONIN, ED
TROPONIN I, POC: 0.01 ng/mL (ref 0.00–0.08)
Troponin i, poc: 0 ng/mL (ref 0.00–0.08)

## 2014-04-08 MED ORDER — HYDROCODONE-ACETAMINOPHEN 5-325 MG PO TABS: 1.0000 | ORAL_TABLET | Freq: Three times a day (TID) | ORAL | Status: DC | PRN

## 2014-04-08 NOTE — ED Provider Notes (Signed)
CSN: [id number]     Arrival date & time 04/08/14  0609 History   First MD Initiated Contact with Patient 04/08/14 [phone]     Chief Complaint  Patient presents with  . Chest Pain     (Consider location/radiation/quality/duration/timing/severity/associated sxs/prior Treatment)   HPI: Destiny Moore is a 80 year old Caucasian female with PMH of HTN, HLD, Osteoporosis, and GERD who presents to the ED on 04/08/2014 for episodes of chest pain that have been occurring since Sunday. She reports her chest pain is in her lower sternal area and radiates around her left breast and into her lower back. She says the pain will be present for a few hours, then go away for several hours. She says the pain in a 10/10 at its worst but is relieved with rest and lying down. She did take 1 pill of Rx pain medicine this morning, but says it did not provide any relief. She has not noticed an association with food. She says the sternal chest pain can occur at times and not radiate to her back. She does report having chronic lower back pain. She denies any trauma or lifting heavy objects. She reports having an episode of bowel incontinence yesterday and says that is highly unusual for her. She reports having shortness of breath when the chest pain is present, but denies any shortness of breath other than with the pain.  Past Medical History  Diagnosis Date  . Hypertension   . Hyperlipidemia   . Anxiety   . UTI (urinary tract infection)   . Depression   . Osteoporosis   . Allergy    Past Surgical History  Procedure Laterality Date  . Hystrectomy    . Bladder tact    . Abdominal hysterectomy    . Incontinence surgery     Family History  Problem Relation Age of Onset  . Heart disease Mother   . Cancer Sister     breast  . Cancer Brother     skin, kidney, lung   History  Substance Use Topics  . Smoking status: Never Smoker   . Smokeless tobacco: Never Used  . Alcohol Use: No   OB History    No data  available     Review of Systems  Constitutional: Positive for chills. Negative for fever, diaphoresis, activity change, appetite change and fatigue.  HENT: Negative for ear discharge, ear pain, facial swelling, mouth sores, rhinorrhea, sinus pressure, sore throat, trouble swallowing and voice change.   Eyes: Negative for photophobia and visual disturbance.  Respiratory: Positive for shortness of breath. Negative for apnea, cough, chest tightness, wheezing and stridor.   Cardiovascular: Positive for chest pain. Negative for palpitations and leg swelling.  Gastrointestinal: Positive for nausea. Negative for vomiting, abdominal pain, diarrhea, constipation, blood in stool and abdominal distention.  Endocrine: Positive for polyuria. Negative for polydipsia and polyphagia.  Genitourinary: Positive for frequency. Negative for dysuria, urgency, hematuria, flank pain, difficulty urinating and pelvic pain.  Musculoskeletal: Positive for back pain and arthralgias. Negative for gait problem, neck pain and neck stiffness.  Skin: Negative for pallor, rash and wound.  Neurological: Negative for dizziness, tremors, seizures, syncope, weakness, light-headedness, numbness and headaches.  Hematological: Negative for adenopathy.      Allergies  Coreg; Lisinopril; Sertraline hcl; and Carvedilol  Home Medications   Prior to Admission medications   Medication Sig Start Date End Date Taking? Authorizing Provider  alendronate (FOSAMAX) 70 MG tablet Take 70 mg by mouth once a week. Take  with a full glass of water on an empty stomach. On Monday   Yes Historical Provider, MD  amLODipine (NORVASC) 10 MG tablet Take 10 mg by mouth daily.   Yes Historical Provider, MD  aspirin 81 MG tablet Take 81 mg by mouth daily.   Yes Historical Provider, MD  B Complex-C (B-COMPLEX WITH VITAMIN C) tablet Take 1 tablet by mouth daily.   Yes Historical Provider, MD  DUREZOL 0.05 % EMUL Place 1 drop into the right eye daily.   03/11/14  Yes Historical Provider, MD  EPINEPHrine (EPIPEN) 0.3 mg/0.3 mL DEVI Inject 0.3 mg into the muscle as needed (for allergic reaction).    Yes Historical Provider, MD  gabapentin (NEURONTIN) 100 MG capsule Take 300 mg by mouth at bedtime.   Yes Historical Provider, MD  hydrochlorothiazide (MICROZIDE) 12.5 MG capsule Take 12.5 mg by mouth daily.   Yes Historical Provider, MD  loperamide (IMODIUM) 2 MG capsule Take 1 capsule (2 mg total) by mouth as needed for diarrhea or loose stools. 02/27/14  Yes Shon Batonourtney F Horton, MD  ondansetron (ZOFRAN) 4 MG tablet Take 1 tablet (4 mg total) by mouth every 8 (eight) hours as needed for nausea or vomiting. 02/27/14  Yes Shon Batonourtney F Horton, MD  pantoprazole (PROTONIX) 40 MG tablet Take 40 mg by mouth daily.   Yes Historical Provider, MD  POTASSIUM PO Take 1 tablet by mouth daily.   Yes Historical Provider, MD  simvastatin (ZOCOR) 20 MG tablet Take 20 mg by mouth daily.   Yes Historical Provider, MD  traMADol (ULTRAM) 50 MG tablet Take 1 tablet (50 mg total) by mouth every 8 (eight) hours as needed. 09/20/13  Yes Alexander Althea CharonKaramalegos, DO  gabapentin (NEURONTIN) 100 MG capsule TAKE 3 CAPSULES BY MOUTH EVERY DAY AT BEDTIME 04/01/14   Jacquelin Hawkingalph Nettey, MD   BP 138/72 mmHg  Pulse 72  Temp(Src) 98.1 F (36.7 C) (Oral)  Resp 22  Ht 5\' 1"  (1.549 m)  Wt 150 lb (68.04 kg)  BMI 28.36 kg/m2  SpO2 99% Physical Exam  Constitutional: She is oriented to person, place, and time. She appears well-developed and well-nourished. No distress.  HENT:  Head: Normocephalic and atraumatic.  Nose: Nose normal.  Mouth/Throat: Oropharynx is clear and moist. No oropharyngeal exudate.  Eyes: Conjunctivae are normal. Pupils are equal, round, and reactive to light. Right eye exhibits no discharge. Left eye exhibits no discharge.  Neck: Normal range of motion. No JVD present. No thyromegaly present.  Cardiovascular: Normal rate, regular rhythm, normal heart sounds and intact distal  pulses.  Exam reveals no gallop and no friction rub.   No murmur heard. Pulmonary/Chest: Effort normal and breath sounds normal. No stridor. No respiratory distress. She has no wheezes. She has no rales. She exhibits tenderness.  Tender to palpation along lower sternal region.  Abdominal: Soft. Bowel sounds are normal. She exhibits no distension and no mass. There is no tenderness. There is no rebound and no guarding.  Musculoskeletal: Normal range of motion. She exhibits no edema or tenderness.  Tender to palpation along lumbar spine.  Lymphadenopathy:    She has no cervical adenopathy.  Neurological: She is alert and oriented to person, place, and time. No cranial nerve deficit. She exhibits normal muscle tone. Coordination normal.  Skin: Skin is warm and dry. No rash noted. She is not diaphoretic. No erythema. No pallor.  Nursing note and vitals reviewed.   ED Course  Procedures (including critical care time) Labs Review Labs Reviewed  COMPREHENSIVE METABOLIC PANEL - Abnormal; Notable for the following:    Glucose, Bld 150 (*)    Creatinine, Ser 1.56 (*)    GFR calc non Af Amer 30 (*)    GFR calc Af Amer 35 (*)    Anion gap 18 (*)    All other components within normal limits  PRO B NATRIURETIC PEPTIDE  CBC  I-STAT TROPOININ, ED    Imaging Review Dg Chest 2 View  04/08/2014   CLINICAL DATA:  Left-sided chest pain.  EXAM: CHEST  2 VIEW  COMPARISON:  02/12/2012  FINDINGS: Heart size and pulmonary vascularity are normal. The lungs are clear. No osseous abnormality. No effusions.  IMPRESSION: Normal exam.   Electronically Signed   By: Geanie CooleyJim  Maxwell M.D.   On: 04/08/2014 07:15     EKG Interpretation   Date/Time:  Tuesday April 08 2014 06:16:53 EST Ventricular Rate:  79 PR Interval:  153 QRS Duration: 91 QT Interval:  388 QTC Calculation: 445 R Axis:   26 Text Interpretation:  Sinus rhythm Abnormal inferior Q waves Confirmed by  DELOS  MD, DOUGLAS ([id number]) on 04/08/2014  11:14:51 AM      MDM   Final diagnoses:  Mid sternal chest pain   The patient will be discharged home, given the fact that she has had 2 sets of negative troponins and the pain has been constant for the last 4 days.  The patient is asked to follow up with her primary care doctor and cardiology the patient is advised return here as needed.  The patient is stable at discharge is feeling better at this time.  The fact that she has had constant pain over this timeframe is somewhat atypical for cardiac chest pain.  Did advise her that this still could be her heart.  That is what she will need to follow-up      Carlyle DollyChristopher W Bertrand Vowels, PA-C 04/12/14 1532  Geoffery Lyonsouglas Delo, MD 04/17/14 [phone]

## 2014-04-08 NOTE — Discharge Instructions (Signed)
Return here as needed.  Follow-up with your cardiologist. °

## 2014-04-08 NOTE — ED Notes (Signed)
Husband given a cup of coffee

## 2014-04-08 NOTE — ED Notes (Signed)
PA at bedside.

## 2014-04-08 NOTE — ED Notes (Signed)
Pt states she yesterday  Because she felt lightheaded.

## 2014-04-08 NOTE — ED Notes (Signed)
Resident at bedside.  

## 2014-04-08 NOTE — ED Notes (Signed)
Pt reports her chest pain started on Sunday, pt reports her pain is centralized and radiates across her left chest and to her back. Pt reports SOB and dizziness associated with her chest pain. Pain 8/10. Pt reports pain has been intermittent since Sunday however the pain increased this morning and has not let up. Pt is A&O X4.

## 2014-04-08 NOTE — ED Notes (Signed)
Pt back from x-ray.

## 2014-04-08 NOTE — ED Notes (Signed)
Went to ask pt if she felt like she could give a urine sample. PT husband states, " I got a pan by myself put her on and threw away the urine". Advised pt and family to please call out when pt has to urinate for safety.

## 2014-04-09 LAB — URINE CULTURE
Colony Count: NO GROWTH
Culture: NO GROWTH

## 2014-04-12 ENCOUNTER — Encounter (HOSPITAL_COMMUNITY): Payer: Self-pay | Admitting: Emergency Medicine

## 2014-04-12 ENCOUNTER — Emergency Department (HOSPITAL_COMMUNITY): Payer: Medicare Other

## 2014-04-12 ENCOUNTER — Encounter: Payer: Self-pay | Admitting: Emergency Medicine

## 2014-04-12 ENCOUNTER — Emergency Department (HOSPITAL_COMMUNITY)
Admission: EM | Admit: 2014-04-12 | Discharge: 2014-04-12 | Disposition: A | Discharge status: Home / Self Care | Payer: Medicare Other | Attending: Emergency Medicine | Admitting: Emergency Medicine

## 2014-04-12 ENCOUNTER — Ambulatory Visit (INDEPENDENT_AMBULATORY_CARE_PROVIDER_SITE_OTHER): Payer: Medicare Other | Admitting: Emergency Medicine

## 2014-04-12 VITALS — BP 122/68 | HR 90 | Temp 98.1°F | Resp 18

## 2014-04-12 DIAGNOSIS — R55 Syncope and collapse: Secondary | ICD-10-CM

## 2014-04-12 DIAGNOSIS — E785 Hyperlipidemia, unspecified: Secondary | ICD-10-CM | POA: Insufficient documentation

## 2014-04-12 DIAGNOSIS — Z90711 Acquired absence of uterus with remaining cervical stump: Secondary | ICD-10-CM | POA: Diagnosis not present

## 2014-04-12 DIAGNOSIS — N390 Urinary tract infection, site not specified: Secondary | ICD-10-CM | POA: Diagnosis not present

## 2014-04-12 DIAGNOSIS — Z8739 Personal history of other diseases of the musculoskeletal system and connective tissue: Secondary | ICD-10-CM | POA: Insufficient documentation

## 2014-04-12 DIAGNOSIS — R51 Headache: Secondary | ICD-10-CM | POA: Diagnosis present

## 2014-04-12 DIAGNOSIS — Z79899 Other long term (current) drug therapy: Secondary | ICD-10-CM | POA: Insufficient documentation

## 2014-04-12 DIAGNOSIS — I10 Essential (primary) hypertension: Secondary | ICD-10-CM | POA: Diagnosis not present

## 2014-04-12 DIAGNOSIS — R079 Chest pain, unspecified: Secondary | ICD-10-CM

## 2014-04-12 DIAGNOSIS — Z8659 Personal history of other mental and behavioral disorders: Secondary | ICD-10-CM | POA: Insufficient documentation

## 2014-04-12 DIAGNOSIS — Z7982 Long term (current) use of aspirin: Secondary | ICD-10-CM | POA: Diagnosis not present

## 2014-04-12 LAB — URINALYSIS, ROUTINE W REFLEX MICROSCOPIC
BILIRUBIN URINE: NEGATIVE
Glucose, UA: NEGATIVE mg/dL
Hgb urine dipstick: NEGATIVE
KETONES UR: NEGATIVE mg/dL
NITRITE: NEGATIVE
Protein, ur: NEGATIVE mg/dL
Specific Gravity, Urine: 1.012 (ref 1.005–1.030)
Urobilinogen, UA: 0.2 mg/dL (ref 0.0–1.0)
pH: 6.5 (ref 5.0–8.0)

## 2014-04-12 LAB — COMPREHENSIVE METABOLIC PANEL
ALT: 13 U/L (ref 0–35)
ANION GAP: 13 (ref 5–15)
AST: 20 U/L (ref 0–37)
Albumin: 3.5 g/dL (ref 3.5–5.2)
Alkaline Phosphatase: 62 U/L (ref 39–117)
BILIRUBIN TOTAL: 0.4 mg/dL (ref 0.3–1.2)
BUN: 19 mg/dL (ref 6–23)
CHLORIDE: 100 meq/L (ref 96–112)
CO2: 24 meq/L (ref 19–32)
Calcium: 8.7 mg/dL (ref 8.4–10.5)
Creatinine, Ser: 1.53 mg/dL — ABNORMAL HIGH (ref 0.50–1.10)
GFR, EST AFRICAN AMERICAN: 36 mL/min — AB (ref >90)
GFR, EST NON AFRICAN AMERICAN: 31 mL/min — AB (ref >90)
Glucose, Bld: 96 mg/dL (ref 70–99)
POTASSIUM: 3.4 meq/L — AB (ref 3.7–5.3)
Sodium: 137 mEq/L (ref 137–147)
Total Protein: 6.7 g/dL (ref 6.0–8.3)

## 2014-04-12 LAB — CBC WITH DIFFERENTIAL/PLATELET
Basophils Absolute: 0 10*3/uL (ref 0.0–0.1)
Basophils Relative: 0 % (ref 0–1)
Eosinophils Absolute: 0.1 10*3/uL (ref 0.0–0.7)
Eosinophils Relative: 1 % (ref 0–5)
HEMATOCRIT: 35.5 % — AB (ref 36.0–46.0)
Hemoglobin: 11.8 g/dL — ABNORMAL LOW (ref 12.0–15.0)
Lymphocytes Relative: 24 % (ref 12–46)
Lymphs Abs: 1.3 10*3/uL (ref 0.7–4.0)
MCH: 29.4 pg (ref 26.0–34.0)
MCHC: 33.2 g/dL (ref 30.0–36.0)
MCV: 88.5 fL (ref 78.0–100.0)
Monocytes Absolute: 0.5 10*3/uL (ref 0.1–1.0)
Monocytes Relative: 9 % (ref 3–12)
NEUTROS ABS: 3.6 10*3/uL (ref 1.7–7.7)
NEUTROS PCT: 66 % (ref 43–77)
Platelets: 212 10*3/uL (ref 150–400)
RBC: 4.01 MIL/uL (ref 3.87–5.11)
RDW: 13.2 % (ref 11.5–15.5)
WBC: 5.4 10*3/uL (ref 4.0–10.5)

## 2014-04-12 LAB — I-STAT CG4 LACTIC ACID, ED: Lactic Acid, Venous: 0.42 mmol/L — ABNORMAL LOW (ref 0.5–2.2)

## 2014-04-12 LAB — GLUCOSE, POCT (MANUAL RESULT ENTRY): POC Glucose: 115 mg/dl — AB (ref 70–99)

## 2014-04-12 LAB — URINE MICROSCOPIC-ADD ON

## 2014-04-12 MED ORDER — SODIUM CHLORIDE 0.9 % IV BOLUS (SEPSIS)
500.0000 mL | Freq: Once | INTRAVENOUS | Status: AC
  Administered 2014-04-12: 500 mL via INTRAVENOUS

## 2014-04-12 MED ORDER — FOSFOMYCIN TROMETHAMINE 3 G PO PACK
3.0000 g | PACK | Freq: Once | ORAL | Status: AC
  Administered 2014-04-12: 3 g via ORAL
  Filled 2014-04-12: qty 3

## 2014-04-12 NOTE — Progress Notes (Signed)
   Subjective:    Patient ID: Destiny Moore, female    DOB: 01/19/1933, 80 y.o.   MRN: [id number]  HPI patient was seen in the emergency room 4 days ago for evaluation of chest discomfort. EKG was normal at that time. Troponin level was normal. No etiology was found for her pain. This morning while up in her bedroom  she felt faint as though she would pass out. Her husband had to help her. She enters here today with a 5 out of 10 headache associated with neck pain and a continued sensation she is going to pass out. She is not currently complaining of chest pain but was in the emergency room 3 days ago with severe substernal pain with a negative workup  Review of Systems     Objective:   Physical Exam  Constitutional:  Patient is an elderly female who needs assistance getting into the lobby and into the exam room. She is conscious. And able to ambulate or answer questions.  Neck: Normal range of motion.  Cardiovascular: Normal rate and regular rhythm.   Pulmonary/Chest: Effort normal and breath sounds normal.  Abdominal: Soft. There is no tenderness.  Skin:  Skin is  clammy.   EKG normal sinus rhythm no acute changes there small R waves in V3 and V4.       Assessment & Plan:  Patient with recent episode of chest pain with negative ER evaluation. She presents this morning with a near syncopal episode with unsteady gait. She complains of a 5 out of 10 frontal headache. Not currently complaining of chest pain. Patient placed on a monitor sugar ordered. EMS called

## 2014-04-12 NOTE — ED Provider Notes (Signed)
CSN: [id number]     Arrival date & time 04/12/14  1056 History   First MD Initiated Contact with Patient 04/12/14 1106     Chief Complaint  Patient presents with  . Headache  . Weakness     HPI  Patient presents with new headache, disequilibrium, near syncope. Symptoms began earlier today, since onset has been persistent, with new difficulty walking due to headache and disequilibrium. Patient denies confusion or disorientation, chest denies chest pain, which she had last week. No new medications, no new activity, no new diet. Patient went to urgent care, was sent here for evaluation.    Past Medical History  Diagnosis Date  . Hypertension   . Hyperlipidemia   . Anxiety   . UTI (urinary tract infection)   . Depression   . Osteoporosis   . Allergy    Past Surgical History  Procedure Laterality Date  . Hystrectomy    . Bladder tact    . Abdominal hysterectomy    . Incontinence surgery     Family History  Problem Relation Age of Onset  . Heart disease Mother   . Cancer Sister     breast  . Cancer Brother     skin, kidney, lung   History  Substance Use Topics  . Smoking status: Never Smoker   . Smokeless tobacco: Never Used  . Alcohol Use: No   OB History    No data available     Review of Systems  Constitutional:       Per HPI, otherwise negative  HENT:       Per HPI, otherwise negative  Respiratory:       Per HPI, otherwise negative  Cardiovascular:       Per HPI, otherwise negative  Gastrointestinal: Negative for vomiting.  Endocrine:       Negative aside from HPI  Genitourinary:       Neg aside from HPI   Musculoskeletal:       Per HPI, otherwise negative  Skin: Negative.   Neurological: Positive for weakness. Negative for syncope.       Chronic left-sided weakness      Allergies  Coreg; Lisinopril; Sertraline hcl; and Carvedilol  Home Medications   Prior to Admission medications   Medication Sig Start Date End Date Taking?  Authorizing Provider  alendronate (FOSAMAX) 70 MG tablet Take 70 mg by mouth once a week. Take with a full glass of water on an empty stomach. On Monday    Historical Provider, MD  amLODipine (NORVASC) 10 MG tablet Take 10 mg by mouth daily.    Historical Provider, MD  aspirin 81 MG tablet Take 81 mg by mouth daily.    Historical Provider, MD  B Complex-C (B-COMPLEX WITH VITAMIN C) tablet Take 1 tablet by mouth daily.    Historical Provider, MD  DUREZOL 0.05 % EMUL Place 1 drop into the right eye daily.  03/11/14   Historical Provider, MD  EPINEPHrine (EPIPEN) 0.3 mg/0.3 mL DEVI Inject 0.3 mg into the muscle as needed (for allergic reaction).     Historical Provider, MD  gabapentin (NEURONTIN) 100 MG capsule TAKE 3 CAPSULES BY MOUTH EVERY DAY AT BEDTIME 04/01/14   Jacquelin Hawkingalph Nettey, MD  gabapentin (NEURONTIN) 100 MG capsule Take 300 mg by mouth at bedtime.    Historical Provider, MD  hydrochlorothiazide (MICROZIDE) 12.5 MG capsule Take 12.5 mg by mouth daily.    Historical Provider, MD  HYDROcodone-acetaminophen (NORCO/VICODIN) 5-325 MG per tablet Take 1  tablet by mouth every 8 (eight) hours as needed for moderate pain. Patient not taking: Reported on 04/12/2014 04/08/14   Jamesetta Orleans Lawyer, PA-C  loperamide (IMODIUM) 2 MG capsule Take 1 capsule (2 mg total) by mouth as needed for diarrhea or loose stools. Patient not taking: Reported on 04/12/2014 02/27/14   Shon Baton, MD  ondansetron (ZOFRAN) 4 MG tablet Take 1 tablet (4 mg total) by mouth every 8 (eight) hours as needed for nausea or vomiting. Patient not taking: Reported on 04/12/2014 02/27/14   Shon Baton, MD  pantoprazole (PROTONIX) 40 MG tablet Take 40 mg by mouth daily.    Historical Provider, MD  POTASSIUM PO Take 1 tablet by mouth daily.    Historical Provider, MD  simvastatin (ZOCOR) 20 MG tablet Take 20 mg by mouth daily.    Historical Provider, MD  traMADol (ULTRAM) 50 MG tablet Take 1 tablet (50 mg total) by mouth every 8  (eight) hours as needed. Patient not taking: Reported on 04/12/2014 09/20/13   Saralyn Pilar, DO   BP 119/59 mmHg  Pulse 67  Temp(Src) 98.1 F (36.7 C) (Oral)  Resp 18  SpO2 97% Physical Exam  Constitutional: She is oriented to person, place, and time. She appears well-developed and well-nourished. No distress.  HENT:  Head: Normocephalic and atraumatic.  Eyes: Conjunctivae and EOM are normal.  Cardiovascular: Normal rate and regular rhythm.   Pulmonary/Chest: Effort normal and breath sounds normal. No stridor. No respiratory distress.  Abdominal: She exhibits no distension.  Musculoskeletal: She exhibits no edema.  Neurological: She is alert and oriented to person, place, and time. No cranial nerve deficit.  Faces symmetric, speech is clear, patient is oriented 3. Left upper and lower extremity have slightly diminished strength compared to right, no gross disequilibrium Patient does describe discomfort, and nausea with rapid head rotation  Skin: Skin is warm and dry.  Psychiatric: She has a normal mood and affect.  Nursing note and vitals reviewed.   ED Course  Procedures (including critical care time) Labs Review Labs Reviewed  CBC WITH DIFFERENTIAL - Abnormal; Notable for the following:    Hemoglobin 11.8 (*)    HCT 35.5 (*)    All other components within normal limits  COMPREHENSIVE METABOLIC PANEL - Abnormal; Notable for the following:    Potassium 3.4 (*)    Creatinine, Ser 1.53 (*)    GFR calc non Af Amer 31 (*)    GFR calc Af Amer 36 (*)    All other components within normal limits  URINALYSIS, ROUTINE W REFLEX MICROSCOPIC - Abnormal; Notable for the following:    APPearance CLOUDY (*)    Leukocytes, UA LARGE (*)    All other components within normal limits  URINE MICROSCOPIC-ADD ON - Abnormal; Notable for the following:    Bacteria, UA FEW (*)    All other components within normal limits  I-STAT CG4 LACTIC ACID, ED - Abnormal; Notable for the  following:    Lactic Acid, Venous 0.42 (*)    All other components within normal limits    Imaging Review Ct Head Wo Contrast  04/12/2014   CLINICAL DATA:  Near syncope.  Headache.  Hypertension.  EXAM: CT HEAD WITHOUT CONTRAST  TECHNIQUE: Contiguous axial images were obtained from the base of the skull through the vertex without intravenous contrast.  COMPARISON:  06/04/2009  FINDINGS: There is no evidence of intracranial hemorrhage, brain edema, or other signs of acute infarction. There is no evidence of intracranial  mass lesion or mass effect. No abnormal extraaxial fluid collections are identified.  Old lacunar infarct seen involving the right caudate nucleus. Ventricles are stable in size. No skull abnormality identified.  IMPRESSION: No acute intracranial abnormality. 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   Electronically Signed   By: Myles RosenthalJohn  Stahl M.D.   On: 04/12/2014 12:14    EKG with rate 71, sinus rhythm, artifact, unremarkable  2:44 PM HA free, no distress. She and husband aware of all results.  MDM  Patient presents with multiple concerns, including lightheadedness, nausea, headache. Here the patient is neurologically intact, hemodynamically stable, awake and alert throughout her emergency department course. No evidence for neurologic dysfunction, given the patient's description of headache, with new new syncope, CT scans performed to exclude spontaneous bleed given her age. With reassuring findings, and evidence only for a urinary tract infection, the patient received antibiotics here, was discharged in stable condition to follow-up with primary care.     Gerhard Munchobert Iesha Summerhill, MD 04/12/14 [phone]

## 2014-04-12 NOTE — ED Notes (Signed)
Pt from [facility], states she had several near syncopal episodes this morning at home, also reports severe headache and chronic neck pain. Pt is alert and oriented x4. Able to move all extremities upon arrival, speech clear, no facial droop.

## 2014-04-12 NOTE — ED Notes (Signed)
[facility]. Pt woke up this morning with a headache and feeling weak about 0800. EMS stroke scale negative. Pt husband at bedside stated to EMS that pt needed help getting out of the car and walking which is not normal for her. She is normally able to walk on her own without difficulty. Pt was recently evaluated in ED for CP 3 days ago. Denies any CP at this time or SOB. Pt also c/o neck pain with hx of chronic neck pain.  Pt stated to EMS that she felt like she was going to pass out when she was getting out of car. BP-128/70 HR-68 Resp-16 CBG-140 O2sat: 98% ra

## 2014-04-12 NOTE — Discharge Instructions (Signed)
As discussed, your evaluation today has been largely reassuring.  But, it is important that you monitor your condition carefully, and do not hesitate to return to the ED if you develop new, or concerning changes in your condition.  You have received a single dose of antibiotics for your urinary tract infection.  No additional antibiotics are currently required.  Please follow-up with your physician for appropriate ongoing care.

## 2014-04-16 ENCOUNTER — Encounter: Payer: Self-pay | Admitting: Emergency Medicine

## 2014-04-17 ENCOUNTER — Encounter: Payer: Self-pay | Admitting: Family Medicine

## 2014-04-17 ENCOUNTER — Ambulatory Visit (INDEPENDENT_AMBULATORY_CARE_PROVIDER_SITE_OTHER): Payer: Medicare Other | Admitting: Family Medicine

## 2014-04-17 VITALS — BP 138/78 | HR 73 | Temp 98.3°F | Ht 61.0 in | Wt 149.0 lb

## 2014-04-17 DIAGNOSIS — K8020 Calculus of gallbladder without cholecystitis without obstruction: Secondary | ICD-10-CM

## 2014-04-17 DIAGNOSIS — K219 Gastro-esophageal reflux disease without esophagitis: Secondary | ICD-10-CM

## 2014-04-17 MED ORDER — RANITIDINE HCL 150 MG PO TABS: 150.0000 mg | ORAL_TABLET | Freq: Two times a day (BID) | ORAL | Status: DC

## 2014-04-17 NOTE — Progress Notes (Signed)
   Subjective:    Patient ID: Destiny Moore, female    DOB: 1932/08/15, 80 y.o.   MRN: [id number]  HPI: Pt presents to clinic for SDA for ED follow-up. Pt went to urgent care and was seen by Dr. Cleta Albertsaub on 12/12 for dizziness and near-syncope (pt was seen a few days earlier in the ED for chest pain, with negative work-up); she was sent to the ED and had a grossly unremarkable work-up, though she had evidence for a UTI was treated with fosfomycin. Since then she states she has no urinary symptoms and has felt "light headed a little," but nothing that has lasted, and she has had no falls. She does have some back pain off and on with activity for "a while," that spreads around into her abdomen, that has been going on a month or more. She does have some increased belching for a couple of days. She takes Protonix for GERD, which she has been taking at least a year. She can identify no specific triggers for her abdominal pain, other than it being worse when she is "up and active."   Of note, she has had a CT scan in the last few months that showed gallstones but no cholecystitis or obstruction. Also of note, pt states a few times that her "memory is pretty bad," and the above complaints are at times described very vaguely, with considerable effort spent in determining severity and duration of various complaints.  Review of Systems: As above.     Objective:   Physical Exam BP 138/78 mmHg  Pulse 73  Temp(Src) 98.3 F (36.8 C) (Oral)  Ht 5\' 1"  (1.549 m)  Wt 149 lb (67.586 kg)  BMI 28.17 kg/m2 Gen: elderly adult female in NAD Neuro: alert, oriented, no gross focal deficits, strength intact throughout all ext  Gait slightly slow but antalgic and pt walks unassisted Cardio: RRR, no murmur appreciated Pulm: CTAB, no wheezes Abd: soft, BS+  Diffuse mild tenderness, worse with deeper palpation  Worst tenderness epigastrically     Assessment & Plan:  80yo female with complex history, recently treated  for UTI, with abdominal pain and recent CT scan showing gallstones - history overall suggests GERD +/- gastritis, incompletely controlled with Protonix - no cholecystitis on CT and exam today suggests no frank cholecystitis, which could explain intermittent abdominal pain - orthostatics negative in-office today, and neurologic exam grossly unremarkable  Plan: - continue PPI (Protonix) - new Rx for Zantac 150 mg BID, to try for at least two weeks - defer abdominal US or other imaging for now, though could consider US or referral to surgery for consideration of elective cholecystectomy - f/u with PCP Dr. Althea CharonKaramalegos in 2 weeks, regardless, to re-eval and see if further work-up is necessary  FYI to Dr. Althea CharonKaramalegos.   Of note, pt stated at the end of the visit she would potentially like to change PCP. Instructed her to discuss this with the clinic management as there is a process for PCP changes. Explained to her that I don't think she can request specific providers, and explained that I will only be here 6 more months, personally.  Bobbye Mortonhristopher M Street, MD PGY-3, [facility] 04/17/2014, 2:10 PM

## 2014-04-17 NOTE — Patient Instructions (Addendum)
Thank you for coming in, today!  Your blood pressure looks fine today. It might have been a little too low, causing your dizziness and other problems, last week.  I think your pain is being caused by reflux problems. You have gallstones, as well, which might be part of your pain.  I want you to stay on your pantoprazole. I want you to start taking Zantac (ranitidine) 150 mg twice a day.  Try this for at least two weeks, then come back to see Dr. Althea CharonKaramalegos (Dr. Kirtland BouchardK). If this doesn't help much, he might want to look into some other stuff for you. If you need to be seen sooner, give us a call any time. Please feel free to call with any questions or concerns at any time, at [phone]. --Dr. Casper HarrisonStreet

## 2014-04-30 ENCOUNTER — Other Ambulatory Visit: Payer: Self-pay | Admitting: *Deleted

## 2014-04-30 MED ORDER — GABAPENTIN 100 MG PO CAPS: ORAL_CAPSULE | ORAL | Status: DC

## 2014-05-05 ENCOUNTER — Encounter (HOSPITAL_COMMUNITY): Payer: Self-pay | Admitting: *Deleted

## 2014-05-05 ENCOUNTER — Ambulatory Visit (INDEPENDENT_AMBULATORY_CARE_PROVIDER_SITE_OTHER): Payer: PRIVATE HEALTH INSURANCE | Admitting: Family Medicine

## 2014-05-05 ENCOUNTER — Inpatient Hospital Stay (HOSPITAL_COMMUNITY)
Admission: EM | Admit: 2014-05-05 | Discharge: 2014-05-09 | DRG: 418 | Disposition: A | Discharge status: Home / Self Care | Payer: Medicare Other | Attending: Family Medicine | Admitting: Family Medicine

## 2014-05-05 ENCOUNTER — Emergency Department (HOSPITAL_COMMUNITY): Payer: Medicare Other

## 2014-05-05 VITALS — BP 136/78 | HR 80 | Temp 97.7°F | Ht 61.0 in | Wt 154.8 lb

## 2014-05-05 DIAGNOSIS — K8020 Calculus of gallbladder without cholecystitis without obstruction: Secondary | ICD-10-CM | POA: Diagnosis present

## 2014-05-05 DIAGNOSIS — Z87440 Personal history of urinary (tract) infections: Secondary | ICD-10-CM

## 2014-05-05 DIAGNOSIS — Z419 Encounter for procedure for purposes other than remedying health state, unspecified: Secondary | ICD-10-CM

## 2014-05-05 DIAGNOSIS — E871 Hypo-osmolality and hyponatremia: Secondary | ICD-10-CM | POA: Diagnosis present

## 2014-05-05 DIAGNOSIS — K219 Gastro-esophageal reflux disease without esophagitis: Secondary | ICD-10-CM | POA: Diagnosis present

## 2014-05-05 DIAGNOSIS — R1011 Right upper quadrant pain: Secondary | ICD-10-CM | POA: Diagnosis not present

## 2014-05-05 DIAGNOSIS — F329 Major depressive disorder, single episode, unspecified: Secondary | ICD-10-CM | POA: Diagnosis present

## 2014-05-05 DIAGNOSIS — E785 Hyperlipidemia, unspecified: Secondary | ICD-10-CM | POA: Diagnosis present

## 2014-05-05 DIAGNOSIS — M25512 Pain in left shoulder: Secondary | ICD-10-CM | POA: Diagnosis present

## 2014-05-05 DIAGNOSIS — Z7982 Long term (current) use of aspirin: Secondary | ICD-10-CM

## 2014-05-05 DIAGNOSIS — G629 Polyneuropathy, unspecified: Secondary | ICD-10-CM | POA: Diagnosis present

## 2014-05-05 DIAGNOSIS — K8051 Calculus of bile duct without cholangitis or cholecystitis with obstruction: Secondary | ICD-10-CM

## 2014-05-05 DIAGNOSIS — Z888 Allergy status to other drugs, medicaments and biological substances status: Secondary | ICD-10-CM

## 2014-05-05 DIAGNOSIS — K838 Other specified diseases of biliary tract: Secondary | ICD-10-CM

## 2014-05-05 DIAGNOSIS — M1990 Unspecified osteoarthritis, unspecified site: Secondary | ICD-10-CM | POA: Diagnosis present

## 2014-05-05 DIAGNOSIS — K8018 Calculus of gallbladder with other cholecystitis without obstruction: Secondary | ICD-10-CM | POA: Diagnosis not present

## 2014-05-05 DIAGNOSIS — N183 Chronic kidney disease, stage 3 (moderate): Secondary | ICD-10-CM | POA: Diagnosis present

## 2014-05-05 DIAGNOSIS — R109 Unspecified abdominal pain: Secondary | ICD-10-CM | POA: Insufficient documentation

## 2014-05-05 DIAGNOSIS — N179 Acute kidney failure, unspecified: Secondary | ICD-10-CM | POA: Insufficient documentation

## 2014-05-05 DIAGNOSIS — M810 Age-related osteoporosis without current pathological fracture: Secondary | ICD-10-CM | POA: Diagnosis present

## 2014-05-05 DIAGNOSIS — K5909 Other constipation: Secondary | ICD-10-CM | POA: Diagnosis present

## 2014-05-05 DIAGNOSIS — N3281 Overactive bladder: Secondary | ICD-10-CM | POA: Diagnosis present

## 2014-05-05 DIAGNOSIS — I129 Hypertensive chronic kidney disease with stage 1 through stage 4 chronic kidney disease, or unspecified chronic kidney disease: Secondary | ICD-10-CM | POA: Diagnosis present

## 2014-05-05 DIAGNOSIS — Z90710 Acquired absence of both cervix and uterus: Secondary | ICD-10-CM

## 2014-05-05 DIAGNOSIS — F419 Anxiety disorder, unspecified: Secondary | ICD-10-CM | POA: Diagnosis present

## 2014-05-05 DIAGNOSIS — I5032 Chronic diastolic (congestive) heart failure: Secondary | ICD-10-CM | POA: Diagnosis present

## 2014-05-05 DIAGNOSIS — E876 Hypokalemia: Secondary | ICD-10-CM | POA: Diagnosis not present

## 2014-05-05 DIAGNOSIS — F4310 Post-traumatic stress disorder, unspecified: Secondary | ICD-10-CM | POA: Diagnosis present

## 2014-05-05 HISTORY — DX: Chronic kidney disease, unspecified: N18.9

## 2014-05-05 LAB — URINALYSIS, ROUTINE W REFLEX MICROSCOPIC
Bilirubin Urine: NEGATIVE
GLUCOSE, UA: NEGATIVE mg/dL
Hgb urine dipstick: NEGATIVE
Ketones, ur: NEGATIVE mg/dL
NITRITE: NEGATIVE
Protein, ur: NEGATIVE mg/dL
SPECIFIC GRAVITY, URINE: 1.009 (ref 1.005–1.030)
Urobilinogen, UA: 0.2 mg/dL (ref 0.0–1.0)
pH: 5.5 (ref 5.0–8.0)

## 2014-05-05 LAB — CBC WITH DIFFERENTIAL/PLATELET
BASOS PCT: 1 % (ref 0–1)
Basophils Absolute: 0 10*3/uL (ref 0.0–0.1)
Eosinophils Absolute: 0 10*3/uL (ref 0.0–0.7)
Eosinophils Relative: 1 % (ref 0–5)
HCT: 38.3 % (ref 36.0–46.0)
Hemoglobin: 12.8 g/dL (ref 12.0–15.0)
LYMPHS ABS: 1.4 10*3/uL (ref 0.7–4.0)
Lymphocytes Relative: 21 % (ref 12–46)
MCH: 29.7 pg (ref 26.0–34.0)
MCHC: 33.4 g/dL (ref 30.0–36.0)
MCV: 88.9 fL (ref 78.0–100.0)
MONO ABS: 0.5 10*3/uL (ref 0.1–1.0)
Monocytes Relative: 8 % (ref 3–12)
NEUTROS PCT: 69 % (ref 43–77)
Neutro Abs: 4.5 10*3/uL (ref 1.7–7.7)
PLATELETS: 243 10*3/uL (ref 150–400)
RBC: 4.31 MIL/uL (ref 3.87–5.11)
RDW: 13.3 % (ref 11.5–15.5)
WBC: 6.4 10*3/uL (ref 4.0–10.5)

## 2014-05-05 LAB — COMPREHENSIVE METABOLIC PANEL
ALBUMIN: 4.3 g/dL (ref 3.5–5.2)
ALK PHOS: 66 U/L (ref 39–117)
ALT: 21 U/L (ref 0–35)
AST: 30 U/L (ref 0–37)
Anion gap: 11 (ref 5–15)
BUN: 24 mg/dL — ABNORMAL HIGH (ref 6–23)
CALCIUM: 8.6 mg/dL (ref 8.4–10.5)
CO2: 22 mmol/L (ref 19–32)
Chloride: 98 mEq/L (ref 96–112)
Creatinine, Ser: 2.07 mg/dL — ABNORMAL HIGH (ref 0.50–1.10)
GFR, EST AFRICAN AMERICAN: 25 mL/min — AB (ref >90)
GFR, EST NON AFRICAN AMERICAN: 22 mL/min — AB (ref >90)
Glucose, Bld: 125 mg/dL — ABNORMAL HIGH (ref 70–99)
Potassium: 3.6 mmol/L (ref 3.5–5.1)
SODIUM: 131 mmol/L — AB (ref 135–145)
Total Bilirubin: 0.6 mg/dL (ref 0.3–1.2)
Total Protein: 7.9 g/dL (ref 6.0–8.3)

## 2014-05-05 LAB — URINE MICROSCOPIC-ADD ON

## 2014-05-05 LAB — LIPASE, BLOOD: Lipase: 28 U/L (ref 11–59)

## 2014-05-05 MED ORDER — SODIUM CHLORIDE 0.9 % IV BOLUS (SEPSIS)
500.0000 mL | Freq: Once | INTRAVENOUS | Status: AC
  Administered 2014-05-05: 500 mL via INTRAVENOUS

## 2014-05-05 MED ORDER — ACETAMINOPHEN 650 MG RE SUPP: 650.0000 mg | Freq: Four times a day (QID) | RECTAL | Status: DC | PRN

## 2014-05-05 MED ORDER — SIMVASTATIN 20 MG PO TABS
20.0000 mg | ORAL_TABLET | Freq: Every day | ORAL | Status: DC
  Administered 2014-05-07 – 2014-05-09 (×2): 20 mg via ORAL
  Filled 2014-05-05 (×4): qty 1

## 2014-05-05 MED ORDER — GABAPENTIN 300 MG PO CAPS
300.0000 mg | ORAL_CAPSULE | Freq: Every day | ORAL | Status: DC
  Administered 2014-05-06 – 2014-05-08 (×4): 300 mg via ORAL
  Filled 2014-05-05 (×5): qty 1

## 2014-05-05 MED ORDER — SENNA 8.6 MG PO TABS
1.0000 | ORAL_TABLET | Freq: Two times a day (BID) | ORAL | Status: DC
  Administered 2014-05-06 – 2014-05-09 (×5): 8.6 mg via ORAL
  Filled 2014-05-05 (×9): qty 1

## 2014-05-05 MED ORDER — HYDROCHLOROTHIAZIDE 12.5 MG PO CAPS
12.5000 mg | ORAL_CAPSULE | Freq: Every day | ORAL | Status: DC
  Administered 2014-05-07 – 2014-05-09 (×2): 12.5 mg via ORAL
  Filled 2014-05-05 (×4): qty 1

## 2014-05-05 MED ORDER — ONDANSETRON 4 MG PO TBDP
8.0000 mg | ORAL_TABLET | Freq: Once | ORAL | Status: AC
  Administered 2014-05-05: 8 mg via ORAL
  Filled 2014-05-05: qty 2

## 2014-05-05 MED ORDER — OXYCODONE-ACETAMINOPHEN 5-325 MG PO TABS
1.0000 | ORAL_TABLET | Freq: Once | ORAL | Status: AC
Start: 2014-05-05 — End: 2014-05-05
  Administered 2014-05-05: 1 via ORAL
  Filled 2014-05-05: qty 1

## 2014-05-05 MED ORDER — DIFLUPREDNATE 0.05 % OP EMUL: 1.0000 [drp] | Freq: Every day | OPHTHALMIC | Status: DC

## 2014-05-05 MED ORDER — ONDANSETRON HCL 4 MG PO TABS: 4.0000 mg | ORAL_TABLET | Freq: Three times a day (TID) | ORAL | Status: DC | PRN

## 2014-05-05 MED ORDER — MORPHINE SULFATE 2 MG/ML IJ SOLN: 2.0000 mg | INTRAMUSCULAR | Status: DC | PRN

## 2014-05-05 MED ORDER — FAMOTIDINE 20 MG PO TABS
20.0000 mg | ORAL_TABLET | Freq: Every day | ORAL | Status: DC
  Administered 2014-05-07 – 2014-05-09 (×2): 20 mg via ORAL
  Filled 2014-05-05 (×4): qty 1

## 2014-05-05 MED ORDER — ASPIRIN 81 MG PO CHEW
81.0000 mg | CHEWABLE_TABLET | Freq: Every day | ORAL | Status: DC
  Administered 2014-05-07: 81 mg via ORAL
  Filled 2014-05-05 (×2): qty 1

## 2014-05-05 MED ORDER — PANTOPRAZOLE SODIUM 40 MG PO TBEC
40.0000 mg | DELAYED_RELEASE_TABLET | Freq: Every day | ORAL | Status: DC
  Administered 2014-05-07 – 2014-05-09 (×2): 40 mg via ORAL
  Filled 2014-05-05 (×2): qty 1

## 2014-05-05 MED ORDER — HEPARIN SODIUM (PORCINE) 5000 UNIT/ML IJ SOLN
5000.0000 [IU] | Freq: Three times a day (TID) | INTRAMUSCULAR | Status: DC
  Administered 2014-05-06 – 2014-05-07 (×4): 5000 [IU] via SUBCUTANEOUS
  Filled 2014-05-05 (×7): qty 1

## 2014-05-05 MED ORDER — AMLODIPINE BESYLATE 10 MG PO TABS
10.0000 mg | ORAL_TABLET | Freq: Every day | ORAL | Status: DC
  Administered 2014-05-07 – 2014-05-09 (×2): 10 mg via ORAL
  Filled 2014-05-05 (×4): qty 1

## 2014-05-05 MED ORDER — SODIUM CHLORIDE 0.9 % IV SOLN
INTRAVENOUS | Status: AC
  Administered 2014-05-06: via INTRAVENOUS
  Administered 2014-05-06: 1000 mL via INTRAVENOUS

## 2014-05-05 MED ORDER — ACETAMINOPHEN 325 MG PO TABS
650.0000 mg | ORAL_TABLET | Freq: Four times a day (QID) | ORAL | Status: DC | PRN
  Administered 2014-05-07 (×2): 650 mg via ORAL
  Filled 2014-05-05 (×2): qty 2

## 2014-05-05 MED ORDER — MORPHINE SULFATE 4 MG/ML IJ SOLN
4.0000 mg | Freq: Once | INTRAMUSCULAR | Status: AC
Start: 2014-05-05 — End: 2014-05-05
  Administered 2014-05-05: 4 mg via INTRAVENOUS
  Filled 2014-05-05: qty 1

## 2014-05-05 NOTE — H&P (Signed)
Family Medicine Teaching [facility] Admission History and Physical Service Pager: [phone]  Patient name: Destiny Moore Medical record number: [id number] Date of birth: 31-Jul-1932 Age: 81 y.o. Gender: female  Primary Care Provider: Saralyn Pilar, DO Consultants: GI Code Status: Full  Chief Complaint: RUQ Abdominal Pain  Assessment and Plan: Destiny Moore is a 81 y.o. female presenting with RUQ pain concerning for common bile duct stone without acute cystitis. PMH is significant for GERD, HTN, Depression, CKD stage III, PTSD, Grade I diastolic CHF.  # RUQ Pain- Complains of intermittent RUQ pain for approximately 6months. Worse with fried foods. Concern for common bile duct stone with obstruction of duct. - Placed in Observation, [facility]den attending - [place]: significant dilatation of the common hepatic duct to 1.3cm concerning for distal obstruction. Multiple large stones in gallbladder. Gallbladder distended without evidence of cholecystitis. Mild intrahepatic biliary ductal dilatation. Negative murphy's - Total bilirubin 0.6 - AST 30, ALT 21 - UA large leukocytes, many squamous epithelial cells, few bacteria - likely contamination vs asymptomatic bacteriuria  - AM labs- CMP - NPO after midnight with NS@100 , plan to advance if no intervention planned tomorrow - Morphine  q4hr PRN pain - GI Consult in am for possible ERCP/MRCP. Appreciate recommendations.  - F/U PT and OT consult  # Hyponatremia - Sodium 131 at admission - CMP tomorrow - Continue to monitor  # GERD- Home medications include Zantac  BID and Protonix  - Pepcid  and Protonix   # HTN-  Home medications include Amlodipine  and HCTZ 12.5mg  - Continue home medications - Well controlled at 136/68. Goal of <140/90 with CKD. - Continue to monitor  # CKD Stage III-  Baseline Cr 1.3-1.5 - Creatinine 2.07 at admission - NS  - CMP tomorrow - Continue to monitor  # Grade I  dCHF - Echo in 2012 showed EF 55-60% with grade 1 diastolic dysfunction - NS@100 . Re-evaluate need for fluids after 12hr  # Neuropathy- Home medication is Gabapentin   - Continue home medication  FEN/GI: NPO after midnight, NS@100  Prophylaxis: Subcutaneous Heparin  Disposition: Placed in Observation, [facility] attending.  History of Present Illness: Destiny Moore is a 81 y.o. female presenting with intermittent RUQ pain for approximately 6 months. Presented to family medicine clinic today for evaluation. Appeared very weak secondary to pain and almost passed out in the clinic when laying down for examination. Was sent to ED for further evaluation for possible gallstones.  States the pain is made worse when eating fried foods and that she ate fried chicken yesterday. Pain was relieved by Hydrocodone which she had from previous ED visit, however pain returned shortly after taking medication. States that morphine given in ED has also helped pain. Admits to subjective fevers and chills, stating that it has been a long time since she's noted fevers however she has had chills today. Admits to nausea, but denies vomiting, diarrhea, or changes in urination. States pain today is no worse than normal, however she is frustrated that the pain keeps returning. No further complaints at this time.  Review Of Systems: Per HPI Otherwise 12 point review of systems was performed and was unremarkable.  Patient Active Problem List   Diagnosis Date Noted  . Abdominal pain 05/05/2014  . Biliary stone 05/05/2014  . GERD (gastroesophageal reflux disease) 04/17/2014  . Cholelithiasis 04/17/2014  . Overactive bladder 09/20/2013  . Bilateral lower extremity pain 08/21/2013  . Diarrhea 05/23/2012  . Cramps, muscle, general 05/18/2012  . Hypokalemia 05/13/2012  .  Acute kidney injury 05/13/2012  . Constipation 01/20/2011  . Generalized pain 01/20/2011  . Abnormal gait 01/20/2011  . Somatic complaints,  multiple 07/26/2010  . Hypertension   . Depression 07/14/2010  . CKD (chronic kidney disease) stage 3, GFR 30-59 ml/min 06/04/2010  . UNSPECIFIED GENITAL PROLAPSE 05/27/2010  . HYPERLIPIDEMIA 06/17/2009  . PTSD 06/17/2009  . ESSENTIAL HYPERTENSION 06/17/2009  . OSTEOARTHRITIS, MULTIPLE JOINTS 06/17/2009  . OSTEOPOROSIS 06/17/2009   Past Medical History: Past Medical History  Diagnosis Date  . Hypertension   . Hyperlipidemia   . Anxiety   . UTI (urinary tract infection)   . Depression   . Osteoporosis   . Allergy    Past Surgical History: Past Surgical History  Procedure Laterality Date  . Hystrectomy    . Bladder tact    . Abdominal hysterectomy    . Incontinence surgery     Social History: History  Substance Use Topics  . Smoking status: Never Smoker   . Smokeless tobacco: Never Used  . Alcohol Use: No   Please also refer to relevant sections of EMR.  Family History: Family History  Problem Relation Age of Onset  . Heart disease Mother   . Cancer Sister     breast  . Cancer Brother     skin, kidney, lung   Allergies and Medications: Allergies  Allergen Reactions  . Coreg Anaphylaxis  . Lisinopril Anaphylaxis    REACTION: Anaphylaxis  . Sertraline Hcl Other (See Comments)    Could not feed herself  . Carvedilol Other (See Comments)    Unknown    No current facility-administered medications on file prior to encounter.   Current Outpatient Prescriptions on File Prior to Encounter  Medication Sig Dispense Refill  . alendronate (FOSAMAX) 70 MG tablet Take 70 mg by mouth once a week. Take with a full glass of water on an empty stomach. On Monday    . amLODipine (NORVASC) 10 MG tablet Take 10 mg by mouth daily.    Marland Kitchen aspirin 81 MG tablet Take 81 mg by mouth daily.    . B Complex-C (B-COMPLEX WITH VITAMIN C) tablet Take 1 tablet by mouth daily.    . DUREZOL 0.05 % EMUL Place 1 drop into the right eye daily.   0  . EPINEPHrine (EPIPEN) 0.3 mg/0.3 mL DEVI  Inject 0.3 mg into the muscle as needed (for allergic reaction).     . gabapentin (NEURONTIN) 100 MG capsule TAKE 3 CAPSULES BY MOUTH EVERY DAY AT BEDTIME 90 capsule 3  . hydrochlorothiazide (MICROZIDE) 12.5 MG capsule Take 12.5 mg by mouth daily.    . pantoprazole (PROTONIX) 40 MG tablet Take 40 mg by mouth daily.    . ranitidine (ZANTAC) 150 MG tablet Take 1 tablet (150 mg total) by mouth 2 (two) times daily. 60 tablet 1  . simvastatin (ZOCOR) 20 MG tablet Take 20 mg by mouth daily.    Marland Kitchen HYDROcodone-acetaminophen (NORCO/VICODIN) 5-325 MG per tablet Take 1 tablet by mouth every 8 (eight) hours as needed for moderate pain. (Patient not taking: Reported on 04/12/2014) 15 tablet 0  . loperamide (IMODIUM) 2 MG capsule Take 1 capsule (2 mg total) by mouth as needed for diarrhea or loose stools. (Patient not taking: Reported on 04/12/2014) 30 capsule 0  . ondansetron (ZOFRAN) 4 MG tablet Take 1 tablet (4 mg total) by mouth every 8 (eight) hours as needed for nausea or vomiting. (Patient not taking: Reported on 04/12/2014) 20 tablet 0    Objective:  BP 121/88 mmHg  Pulse 81  Temp(Src) 97.7 F (36.5 C) (Oral)  Resp 16  Ht 5\' 2"  (1.575 m)  Wt 154 lb (69.854 kg)  BMI 28.16 kg/m2  SpO2 96% Exam: General: frail 81yo female resting comfortably in no apparent distress HEENT: PERRLA. Moist mucous membranes. EOMI Cardiovascular: S1 and S2 noted. No murmurs, rubs, gallops. Pulses palpable. Respiratory: Clear to auscultation bilaterally. No wheezes, rales, rhonchi. No increased work of breathing. Abdomen: Bowel sounds noted. Soft and non-distended. RUQ pain with palpation. Negative Murphy's. Extremities: 2+ Edema noted in lower extremities bilaterally. Skin: No rashes noted. Warm. Neuro: No focal deficits noted. Oriented.  Labs and Imaging: CBC BMET   Recent Labs Lab 05/05/14 1446  WBC 6.4  HGB 12.8  HCT 38.3  PLT 243    Recent Labs Lab 05/05/14 1446  NA 131*  K 3.6  CL 98  CO2 22  BUN  24*  CREATININE 2.07*  GLUCOSE 125*  CALCIUM 8.6     US Abdomen Limited Ruq  05/05/2014   CLINICAL DATA:  Acute onset of generalized abdominal pain. Initial encounter.  EXAM: US ABDOMEN LIMITED - RIGHT UPPER QUADRANT  COMPARISON:  None.  FINDINGS: Gallbladder:  The gallbladder is distended, and contains multiple large mobile stones, measuring up to 1.8 cm. No gallbladder wall thickening or pericholecystic fluid is seen. No ultrasonographic Murphy's sign is elicited.  Common bile duct:  Diameter: 1.3 cm. Significantly dilated, raising concern for distal obstruction.  Liver:  No focal lesion identified. Within normal limits in parenchymal echogenicity. Mild intrahepatic biliary ductal dilatation is suggested.  IMPRESSION: Significant dilatation of the common hepatic duct to 1.3 cm, raising concern for distal obstruction. Multiple large stones seen within the gallbladder; the gallbladder is distended, without evidence of cholecystitis. Mild intrahepatic biliary ductal dilatation also suggested. ERCP or MRCP could be considered for further evaluation.   Electronically Signed   By: Roanna Raider M.D.   On: 05/05/2014 21:24   Araceli Bouche, DO 05/05/2014, 9:57 PM PGY-1,  Family Medicine FPTS Intern pager: [phone], text pages welcome  I have seen and examined Ms. Cleverly with Dr. Caroleen Hamman and I agree with her documentation above. My annotations are in blue.   Murtis Sink, MD [facility] Family [facility] Resident, PGY-3 05/05/2014, 11:29 PM

## 2014-05-05 NOTE — Progress Notes (Signed)
   Subjective:    Patient ID: Destiny Moore, female    DOB: [birth year], 81 y.o.   MRN: [id number]  HPI  ABDOMINAL PAIN: - Reports chronic intermittent Right sided abdominal pain for past several months, last seen in ED for similar complaint 01/2014, had Abdominal CT diagnosed cholelithiasis (non-obstructing) - Recent [facility] follow-up for GERD and advised about gallstones - Follow-up today with significant abdominal pain, Right > Left but some generalized aspect to pain. She reports feeling very weak due to the pain. Stated that severe abdominal pain started earlier today when getting ready to go to doctor's office. Took a Vicodin from previous ER trip, which helped initially but did not resolve pain. Reports that she did have Fried chicken meal yesterday, and has been having pain more increasingly within past 1-2 weeks - Admits to some weakness, nausea - Denies any fevers/chills, vomiting, diarrhea, bloody stools  I have reviewed and updated the following as appropriate: allergies and current medications  Social Hx: - Never smoker  Review of Systems  See above HPI    Objective:   Physical Exam  BP 136/78 mmHg  Pulse 80  Temp(Src) 97.7 F (36.5 C) (Oral)  Ht  (1.549 m)  Wt 154 lb 12.8 oz (70.217 kg)  BMI 29.26 kg/m2  Gen - ill-appearing but non-toxic, chronically ill elderly F patient, moderate distress due to pain HEENT - no sclera icterus, MMM Heart - RRR, no murmurs heard Abd - soft with RUQ tenderness > generalized abd tenderness also with significant LLQ pain, +Murphy's sign on exam, nondistended, no masses, +active BS Ext - non-tender, no edema, peripheral pulses intact +2 b/l Skin - warm, dry Neuro - awake, alert, grossly non-focal     Assessment & Plan:   63 yr F with multiple chronic medical conditions, presents for acute on chronic abdominal pain (>RUQ), prior known dx cholelithiasis (CT Abd 01/2014), seems consistent with acute flare, and no additional red  flag symptoms (afebrile, no evidence of jaundice, AMS). However, unclear etiology given significant severe abdominal pain limiting exam today.  Abdominal Pain, RUQ - concern for cholelithiasis vs cholecystitis, other abdominal etiologies - Send directly to ER for further management and work-up - Patient provided assistance with wheelcair to vehicle, husband to drive, confirms agreement to take her straight to ER - Called ER to notify that this patient is on the way - Recommend abdominal US to eval gallbladder -

## 2014-05-05 NOTE — ED Provider Notes (Signed)
CSN: [id number]     Arrival date & time 05/05/14  1430 History   First MD Initiated Contact with Patient 05/05/14 1737     Chief Complaint  Patient presents with  . Abdominal Pain     (Consider location/radiation/quality/duration/timing/severity/associated sxs/prior Treatment) Patient is a 81 y.o. female presenting with abdominal pain.  Abdominal Pain Pain location:  RUQ Pain quality: cramping and sharp   Pain radiation: sometimers to left side. Pain severity:  Severe Onset quality:  Gradual Duration:  4 hours Timing:  Intermittent Progression:  Waxing and waning Chronicity:  Recurrent Context: eating (greasy food)   Relieved by:  Nothing Worsened by:  Movement Ineffective treatments:  OTC medications Associated symptoms: nausea   Associated symptoms: no chest pain, no constipation, no cough, no diarrhea, no dysuria, no fever, no hematemesis, no melena, no shortness of breath, no sore throat, no vaginal bleeding, no vaginal discharge and no vomiting   Risk factors: being elderly   Risk factors: has not had multiple surgeries     Past Medical History  Diagnosis Date  . Hypertension   . Hyperlipidemia   . Anxiety   . UTI (urinary tract infection)   . Depression   . Osteoporosis   . Allergy    Past Surgical History  Procedure Laterality Date  . Hystrectomy    . Bladder tact    . Abdominal hysterectomy    . Incontinence surgery     Family History  Problem Relation Age of Onset  . Heart disease Mother   . Cancer Sister     breast  . Cancer Brother     skin, kidney, lung   History  Substance Use Topics  . Smoking status: Never Smoker   . Smokeless tobacco: Never Used  . Alcohol Use: No   OB History    No data available     Review of Systems  Constitutional: Negative for fever.  HENT: Negative for sore throat.   Eyes: Negative for visual disturbance.  Respiratory: Negative for cough and shortness of breath.   Cardiovascular: Negative for chest pain.   Gastrointestinal: Positive for nausea and abdominal pain. Negative for vomiting, diarrhea, constipation, blood in stool, melena and hematemesis.  Genitourinary: Negative for dysuria, vaginal bleeding, vaginal discharge and difficulty urinating.  Musculoskeletal: Negative for back pain and neck pain.  Skin: Negative for rash.  Neurological: Negative for syncope and headaches.      Allergies  Coreg; Lisinopril; Sertraline hcl; and Carvedilol  Home Medications   Prior to Admission medications   Medication Sig Start Date End Date Taking? Authorizing Provider  alendronate (FOSAMAX) 70 MG tablet Take 70 mg by mouth once a week. Take with a full glass of water on an empty stomach. On Monday   Yes Historical Provider, MD  amLODipine (NORVASC) 10 MG tablet Take 10 mg by mouth daily.   Yes Historical Provider, MD  aspirin 81 MG tablet Take 81 mg by mouth daily.   Yes Historical Provider, MD  B Complex-C (B-COMPLEX WITH VITAMIN C) tablet Take 1 tablet by mouth daily.   Yes Historical Provider, MD  DUREZOL 0.05 % EMUL Place 1 drop into the right eye daily.  03/11/14  Yes Historical Provider, MD  EPINEPHrine (EPIPEN) 0.3 mg/0.3 mL DEVI Inject 0.3 mg into the muscle as needed (for allergic reaction).    Yes Historical Provider, MD  gabapentin (NEURONTIN) 100 MG capsule TAKE 3 CAPSULES BY MOUTH EVERY DAY AT BEDTIME 04/30/14  Yes Saralyn Pilar, DO  hydrochlorothiazide (  MICROZIDE) 12.5 MG capsule Take 12.5 mg by mouth daily.   Yes Historical Provider, MD  pantoprazole (PROTONIX) 40 MG tablet Take 40 mg by mouth daily.   Yes Historical Provider, MD  ranitidine (ZANTAC) 150 MG tablet Take 1 tablet (150 mg total) by mouth 2 (two) times daily. 04/17/14  Yes Stephanie Coup Street, MD  simvastatin (ZOCOR) 20 MG tablet Take 20 mg by mouth daily.   Yes Historical Provider, MD  HYDROcodone-acetaminophen (NORCO/VICODIN) 5-325 MG per tablet Take 1 tablet by mouth every 8 (eight) hours as needed for moderate  pain. Patient not taking: Reported on 04/12/2014 04/08/14   Jamesetta Orleans Lawyer, PA-C  loperamide (IMODIUM) 2 MG capsule Take 1 capsule (2 mg total) by mouth as needed for diarrhea or loose stools. Patient not taking: Reported on 04/12/2014 02/27/14   Shon Baton, MD  ondansetron (ZOFRAN) 4 MG tablet Take 1 tablet (4 mg total) by mouth every 8 (eight) hours as needed for nausea or vomiting. Patient not taking: Reported on 04/12/2014 02/27/14   Shon Baton, MD   BP 127/62 mmHg  Pulse 72  Temp(Src) 98.5 F (36.9 C) (Oral)  Resp 15  Ht  (1.575 m)  Wt 154 lb (69.854 kg)  BMI 28.16 kg/m2  SpO2 91% Physical Exam  Constitutional: She is oriented to person, place, and time. She appears well-developed and well-nourished. No distress.  HENT:  Head: Normocephalic and atraumatic.  Eyes: Conjunctivae and EOM are normal. Pupils are equal, round, and reactive to light.  Neck: Normal range of motion.  Cardiovascular: Normal rate, regular rhythm, normal heart sounds and intact distal pulses.  Exam reveals no gallop and no friction rub.   No murmur heard. Pulmonary/Chest: Effort normal and breath sounds normal. No respiratory distress. She has no wheezes. She has no rales.  Abdominal: Soft. She exhibits no distension. There is tenderness in the right upper quadrant. There is positive Murphy's sign. There is no guarding and no CVA tenderness.  Musculoskeletal: She exhibits no edema or tenderness.  Neurological: She is alert and oriented to person, place, and time.  Skin: Skin is warm and dry. No rash noted. She is not diaphoretic. No erythema.  Nursing note and vitals reviewed.   ED Course  Procedures (including critical care time) Labs Review Labs Reviewed  COMPREHENSIVE METABOLIC PANEL - Abnormal; Notable for the following:    Sodium 131 (*)    Glucose, Bld 125 (*)    BUN 24 (*)    Creatinine, Ser 2.07 (*)    GFR calc non Af Amer 22 (*)    GFR calc Af Amer 25 (*)    All  other components within normal limits  URINALYSIS, ROUTINE W REFLEX MICROSCOPIC - Abnormal; Notable for the following:    APPearance HAZY (*)    Leukocytes, UA LARGE (*)    All other components within normal limits  URINE MICROSCOPIC-ADD ON - Abnormal; Notable for the following:    Squamous Epithelial / LPF MANY (*)    Bacteria, UA FEW (*)    All other components within normal limits  CBC WITH DIFFERENTIAL  LIPASE, BLOOD  COMPREHENSIVE METABOLIC PANEL    Imaging Review US Abdomen Limited Ruq  05/05/2014   CLINICAL DATA:  Acute onset of generalized abdominal pain. Initial encounter.  EXAM: US ABDOMEN LIMITED - RIGHT UPPER QUADRANT  COMPARISON:  None.  FINDINGS: Gallbladder:  The gallbladder is distended, and contains multiple large mobile stones, measuring up to 1.8 cm. No gallbladder wall  thickening or pericholecystic fluid is seen. No ultrasonographic Murphy's sign is elicited.  Common bile duct:  Diameter: 1.3 cm. Significantly dilated, raising concern for distal obstruction.  Liver:  No focal lesion identified. Within normal limits in parenchymal echogenicity. Mild intrahepatic biliary ductal dilatation is suggested.  IMPRESSION: Significant dilatation of the common hepatic duct to 1.3 cm, raising concern for distal obstruction. Multiple large stones seen within the gallbladder; the gallbladder is distended, without evidence of cholecystitis. Mild intrahepatic biliary ductal dilatation also suggested. ERCP or MRCP could be considered for further evaluation.   Electronically Signed   By: Roanna Raider M.D.   On: 05/05/2014 21:24     EKG Interpretation None      MDM   Final diagnoses:  Abdominal pain  RUQ abdominal pain  Calculus of bile duct with obstruction and without cholangitis or cholecystitis   81 year old female with a history of hyperlipidemia, hypertension, CK D, depression, and episodes of right upper quadrant abdominal pain for over one year presents from her PCPs office  for concern of right upper quadrant abdominal pain.  Urinalysis shows likely contamination.  CMP shows mild AKI on CKD with Cr of 2.  Ultrasound of the right upper quadrant was done which was concerning for dilation of the common hepatic duct with concern for a biliary obstruction.  Patient afebrile, without transaminitis, or other laboratory findings of biliary duct obstruction and doubt cholangitis, cholecystitis.  Family medicine called for admission for ERCP or MRCP for further evaluation of hepatic duct dilation and biliary duct obstruction.     Rhae Lerner, MD 05/06/14 4098  Doug Sou, MD 05/07/14 [id number]

## 2014-05-05 NOTE — ED Notes (Signed)
MD at bedside. 

## 2014-05-05 NOTE — ED Notes (Signed)
Report attempted 

## 2014-05-05 NOTE — Patient Instructions (Signed)
Given your abdominal pain, I am concerned that this is an acute flare of pain due to gallstones, but I cannot be positive. We are sending you to the ER for further evaluation.  Abdominal Pain Many things can cause abdominal pain. Usually, abdominal pain is not caused by a disease and will improve without treatment. It can often be observed and treated at home. Your health care provider will do a physical exam and possibly order blood tests and X-rays to help determine the seriousness of your pain. However, in many cases, more time must pass before a clear cause of the pain can be found. Before that point, your health care provider may not know if you need more testing or further treatment. HOME CARE INSTRUCTIONS  Monitor your abdominal pain for any changes. The following actions may help to alleviate any discomfort you are experiencing:  Only take over-the-counter or prescription medicines as directed by your health care provider.  Do not take laxatives unless directed to do so by your health care provider.  Try a clear liquid diet (broth, tea, or water) as directed by your health care provider. Slowly move to a bland diet as tolerated. SEEK MEDICAL CARE IF:  You have unexplained abdominal pain.  You have abdominal pain associated with nausea or diarrhea.  You have pain when you urinate or have a bowel movement.  You experience abdominal pain that wakes you in the night.  You have abdominal pain that is worsened or improved by eating food.  You have abdominal pain that is worsened with eating fatty foods.  You have a fever. SEEK IMMEDIATE MEDICAL CARE IF:   Your pain does not go away within 2 hours.  You keep throwing up (vomiting).  Your pain is felt only in portions of the abdomen, such as the right side or the left lower portion of the abdomen.  You pass bloody or black tarry stools. MAKE SURE YOU:  Understand these instructions.   Will watch your condition.   Will get  help right away if you are not doing well or get worse.  Document Released: 01/26/2005 Document Revised: 04/23/2013 Document Reviewed: 12/26/2012 [facility] Patient Information 2015 [place], [place]. This information is not intended to replace advice given to you by your health care provider. Make sure you discuss any questions you have with your health care provider.

## 2014-05-05 NOTE — Assessment & Plan Note (Signed)
49 yr F with multiple chronic medical conditions, presents for acute on chronic abdominal pain (>RUQ), prior known dx cholelithiasis (CT Abd 01/2014), seems consistent with acute flare, and no additional red flag symptoms (afebrile, no evidence of jaundice, AMS). However, unclear etiology given significant severe abdominal pain limiting exam today.  Abdominal Pain, RUQ - concern for cholelithiasis vs cholecystitis, other abdominal etiologies - Send directly to ER for further management and work-up - Patient provided assistance with wheelcair to vehicle, husband to drive, confirms agreement to take her straight to ER - Called ER to notify that this patient is on the way - Recommend abdominal US to eval gallbladder

## 2014-05-05 NOTE — ED Provider Notes (Signed)
Plains of right upper quadrant pain nonradiating several months ago. Accompanied by nausea. Patient has been told she has gallstones. Pain is worse with eating fried food pain is moderate at present. No vomiting no fever. Seen by primary care physician today advised to come to the emergency department for further evaluation on exam`. Alert nontoxic abdomen nondistended tender at right upper quadrant  Doug Sou, MD 05/05/14 [id number]

## 2014-05-05 NOTE — ED Notes (Signed)
Pt reports intermittent right side abd pain for extended amount of time. More severe today, pt went to family medicine but was sent here due to severe pain and wanting to r/o gallstones. Having nausea, denies vomiting and diarrhea.

## 2014-05-06 ENCOUNTER — Inpatient Hospital Stay (HOSPITAL_COMMUNITY): Payer: Medicare Other

## 2014-05-06 DIAGNOSIS — R932 Abnormal findings on diagnostic imaging of liver and biliary tract: Secondary | ICD-10-CM

## 2014-05-06 DIAGNOSIS — K8051 Calculus of bile duct without cholangitis or cholecystitis with obstruction: Secondary | ICD-10-CM | POA: Insufficient documentation

## 2014-05-06 DIAGNOSIS — K8018 Calculus of gallbladder with other cholecystitis without obstruction: Secondary | ICD-10-CM | POA: Diagnosis present

## 2014-05-06 DIAGNOSIS — N183 Chronic kidney disease, stage 3 unspecified: Secondary | ICD-10-CM | POA: Insufficient documentation

## 2014-05-06 DIAGNOSIS — N3281 Overactive bladder: Secondary | ICD-10-CM | POA: Diagnosis present

## 2014-05-06 DIAGNOSIS — M25512 Pain in left shoulder: Secondary | ICD-10-CM | POA: Diagnosis present

## 2014-05-06 DIAGNOSIS — Z90710 Acquired absence of both cervix and uterus: Secondary | ICD-10-CM | POA: Diagnosis not present

## 2014-05-06 DIAGNOSIS — R1011 Right upper quadrant pain: Secondary | ICD-10-CM | POA: Diagnosis present

## 2014-05-06 DIAGNOSIS — I10 Essential (primary) hypertension: Secondary | ICD-10-CM

## 2014-05-06 DIAGNOSIS — N179 Acute kidney failure, unspecified: Secondary | ICD-10-CM | POA: Insufficient documentation

## 2014-05-06 DIAGNOSIS — M810 Age-related osteoporosis without current pathological fracture: Secondary | ICD-10-CM | POA: Diagnosis present

## 2014-05-06 DIAGNOSIS — Z888 Allergy status to other drugs, medicaments and biological substances status: Secondary | ICD-10-CM | POA: Diagnosis not present

## 2014-05-06 DIAGNOSIS — K219 Gastro-esophageal reflux disease without esophagitis: Secondary | ICD-10-CM | POA: Diagnosis present

## 2014-05-06 DIAGNOSIS — K8010 Calculus of gallbladder with chronic cholecystitis without obstruction: Secondary | ICD-10-CM

## 2014-05-06 DIAGNOSIS — Z7982 Long term (current) use of aspirin: Secondary | ICD-10-CM | POA: Diagnosis not present

## 2014-05-06 DIAGNOSIS — G629 Polyneuropathy, unspecified: Secondary | ICD-10-CM | POA: Diagnosis present

## 2014-05-06 DIAGNOSIS — E785 Hyperlipidemia, unspecified: Secondary | ICD-10-CM | POA: Diagnosis present

## 2014-05-06 DIAGNOSIS — E876 Hypokalemia: Secondary | ICD-10-CM | POA: Diagnosis not present

## 2014-05-06 DIAGNOSIS — F419 Anxiety disorder, unspecified: Secondary | ICD-10-CM | POA: Diagnosis present

## 2014-05-06 DIAGNOSIS — F329 Major depressive disorder, single episode, unspecified: Secondary | ICD-10-CM | POA: Diagnosis present

## 2014-05-06 DIAGNOSIS — M1990 Unspecified osteoarthritis, unspecified site: Secondary | ICD-10-CM | POA: Diagnosis present

## 2014-05-06 DIAGNOSIS — I5032 Chronic diastolic (congestive) heart failure: Secondary | ICD-10-CM | POA: Diagnosis present

## 2014-05-06 DIAGNOSIS — E871 Hypo-osmolality and hyponatremia: Secondary | ICD-10-CM | POA: Diagnosis present

## 2014-05-06 DIAGNOSIS — K5909 Other constipation: Secondary | ICD-10-CM | POA: Diagnosis present

## 2014-05-06 DIAGNOSIS — I129 Hypertensive chronic kidney disease with stage 1 through stage 4 chronic kidney disease, or unspecified chronic kidney disease: Secondary | ICD-10-CM | POA: Diagnosis present

## 2014-05-06 DIAGNOSIS — F4310 Post-traumatic stress disorder, unspecified: Secondary | ICD-10-CM | POA: Diagnosis present

## 2014-05-06 DIAGNOSIS — Z87440 Personal history of urinary (tract) infections: Secondary | ICD-10-CM | POA: Diagnosis not present

## 2014-05-06 LAB — COMPREHENSIVE METABOLIC PANEL
ALT: 16 U/L (ref 0–35)
ANION GAP: 6 (ref 5–15)
AST: 24 U/L (ref 0–37)
Albumin: 3.3 g/dL — ABNORMAL LOW (ref 3.5–5.2)
Alkaline Phosphatase: 52 U/L (ref 39–117)
BUN: 19 mg/dL (ref 6–23)
CALCIUM: 8.1 mg/dL — AB (ref 8.4–10.5)
CO2: 26 mmol/L (ref 19–32)
CREATININE: 1.82 mg/dL — AB (ref 0.50–1.10)
Chloride: 106 mEq/L (ref 96–112)
GFR calc Af Amer: 29 mL/min — ABNORMAL LOW (ref >90)
GFR, EST NON AFRICAN AMERICAN: 25 mL/min — AB (ref >90)
Glucose, Bld: 84 mg/dL (ref 70–99)
Potassium: 3.5 mmol/L (ref 3.5–5.1)
Sodium: 138 mmol/L (ref 135–145)
Total Bilirubin: 0.7 mg/dL (ref 0.3–1.2)
Total Protein: 5.9 g/dL — ABNORMAL LOW (ref 6.0–8.3)

## 2014-05-06 MED ORDER — SODIUM CHLORIDE 0.9 % IV SOLN: INTRAVENOUS | Status: DC

## 2014-05-06 MED ORDER — MUPIROCIN CALCIUM 2 % EX CREA
TOPICAL_CREAM | Freq: Two times a day (BID) | CUTANEOUS | Status: DC | PRN
  Administered 2014-05-06: 1 via TOPICAL
  Administered 2014-05-08 – 2014-05-09 (×4): via TOPICAL
  Filled 2014-05-06: qty 15

## 2014-05-06 MED ORDER — SODIUM CHLORIDE 0.9 % IV SOLN
INTRAVENOUS | Status: DC
  Administered 2014-05-07: via INTRAVENOUS

## 2014-05-06 MED ORDER — PREDNISOLONE ACETATE 1 % OP SUSP
1.0000 [drp] | Freq: Two times a day (BID) | OPHTHALMIC | Status: DC
  Administered 2014-05-06 – 2014-05-09 (×7): 1 [drp] via OPHTHALMIC
  Filled 2014-05-06: qty 1

## 2014-05-06 NOTE — Progress Notes (Signed)
Family Medicine Teaching Service Daily Progress Note Intern Pager: [phone]  Patient name: Destiny Moore Medical record number: [id number] Date of birth: [birth year] Age: 81 y.o. Gender: female  Primary Care Provider: Saralyn Pilar, DO Consultants: Gastroenterology Code Status: Full  Assessment and Plan: Destiny Moore is a 81 y.o. female presenting with RUQ pain concerning for common bile duct stone without acute cystitis. PMH is significant for GERD, HTN, Depression, CKD stage III, PTSD, Grade I diastolic CHF.  # RUQ Pain- Concern for Choledocholithiasis. Cholelithiasis noted on imaging. - [place]: significant dilatation of the common hepatic duct to 1.3cm concerning for distal obstruction. Multiple large stones in gallbladder. Gallbladder distended without evidence of cholecystitis. Mild intrahepatic biliary ductal dilatation. Negative murphy's - Total bilirubin 0.7 - AST 24, ALT 16 - UA large leukocytes, many squamous epithelial cells, few bacteria - likely contamination vs asymptomatic bacteriuria  - Currenlty NPO with NS@100 , plan to advance if no intervention planned by GI - Morphine  q4hr PRN pain - GI Consult for possible ERCP/MRCP. Appreciate recommendations.  - F/U PT and OT consult  # Hyponatremia- Sodium 131 at admission - Improved to 138 today - Continue to monitor  # GERD- Home medications include Zantac  BID and Protonix  - Pepcid  and Protonix   # CKD Stage III- Baseline Cr 1.3-1.5 - Creatinine 2.07 at admission. Improved to 1.82 today. - NS  - Continue to monitor  # Grade I dCHF - Echo in 2012 showed EF 55-60% with grade 1 diastolic dysfunction - NS@100 . Re-evaluate need for fluids after 12hr  FEN/GI: NPO, NS@100  Prophylaxis: Subcutaneous Heparin  Disposition: Placed in Observation.  Subjective:  No acute complaints overnight. States abdominal pain is much improved today. Is awaiting visit from GI. No further complaints  today.  Objective: Temp:  [97.7 F (36.5 C)-98.5 F (36.9 C)] 98.5 F (36.9 C) (01/05 0557) Pulse Rate:  [62-81] 64 (01/05 0557) Resp:  [15-17] 15 (01/05 0557) BP: (115-152)/(51-121) 115/51 mmHg (01/05 0557) SpO2:  [91 %-98 %] 96 % (01/05 0557) Weight:  [154 lb (69.854 kg)-154 lb 12.8 oz (70.217 kg)] 154 lb (69.854 kg) (01/04 1435) Physical Exam: General: 81yo female resting comfortably in no apparent distress Cardiovascular: S1 and S2 noted, no murmurs/rubs/gallops, regular rate and rhythm Respiratory: Clear to auscultation bilaterally. No wheezes/rales/rhonchi. No increased work of breathing. Abdomen: Bowel sounds noted. Soft and nondistended. Tenderness palpated in RUQ. Extremities: No edema noted.  Laboratory:  Recent Labs Lab 05/05/14 1446  WBC 6.4  HGB 12.8  HCT 38.3  PLT 243    Recent Labs Lab 05/05/14 1446 05/06/14 0255  NA 131* 138  K 3.6 3.5  CL 98 106  CO2 22 26  BUN 24* 19  CREATININE 2.07* 1.82*  CALCIUM 8.6 8.1*  PROT 7.9 5.9*  BILITOT 0.6 0.7  ALKPHOS 66 52  ALT 21 16  AST 30 24  GLUCOSE 125* 84   Urinalysis    Component Value Date/Time   COLORURINE YELLOW 05/05/2014 1754   APPEARANCEUR HAZY* 05/05/2014 1754   LABSPEC 1.009 05/05/2014 1754   PHURINE 5.5 05/05/2014 1754   GLUCOSEU NEGATIVE 05/05/2014 1754   HGBUR NEGATIVE 05/05/2014 1754   HGBUR negative 05/27/2010 1047   BILIRUBINUR NEGATIVE 05/05/2014 1754   BILIRUBINUR neg 09/21/2011 1218   KETONESUR NEGATIVE 05/05/2014 1754   PROTEINUR NEGATIVE 05/05/2014 1754   PROTEINUR neg 09/21/2011 1218   UROBILINOGEN 0.2 05/05/2014 1754   UROBILINOGEN 1.0 09/21/2011 1218   NITRITE NEGATIVE 05/05/2014 1754   NITRITE  neg 09/21/2011 1218   LEUKOCYTESUR LARGE* 05/05/2014 1754   Imaging/Diagnostic Tests: [place] Abdomen Limited Ruq  05/05/2014   CLINICAL DATA:  Acute onset of generalized abdominal pain. Initial encounter.  EXAM: US ABDOMEN LIMITED - RIGHT UPPER QUADRANT  COMPARISON:  None.  FINDINGS:  Gallbladder:  The gallbladder is distended, and contains multiple large mobile stones, measuring up to 1.8 cm. No gallbladder wall thickening or pericholecystic fluid is seen. No ultrasonographic Murphy's sign is elicited.  Common bile duct:  Diameter: 1.3 cm. Significantly dilated, raising concern for distal obstruction.  Liver:  No focal lesion identified. Within normal limits in parenchymal echogenicity. Mild intrahepatic biliary ductal dilatation is suggested.  IMPRESSION: Significant dilatation of the common hepatic duct to 1.3 cm, raising concern for distal obstruction. Multiple large stones seen within the gallbladder; the gallbladder is distended, without evidence of cholecystitis. Mild intrahepatic biliary ductal dilatation also suggested. ERCP or MRCP could be considered for further evaluation.   Electronically Signed   By: Roanna RaiderJeffery  Chang M.D.   On: 05/05/2014 21:24   Araceli BoucheRaleigh N Chase Knebel, DO 05/06/2014, 6:47 AM PGY-1, [facility] FPTS Intern pager: [phone], text pages welcome

## 2014-05-06 NOTE — Progress Notes (Signed)
UR completed 

## 2014-05-06 NOTE — Evaluation (Addendum)
Physical Therapy Evaluation and Discharge Patient Details Name: Destiny Moore MRN: [id number] DOB: [birth year] Today's Date: 05/06/2014   History of Present Illness  Pt is a 81 year old female presenting to hospital from PCP with RUQ pain accompanied by nausea. Pt had been told she had gallstones.     Clinical Impression  Pt reported to be moving at or near baseline status. Pt modified independent for bed mobility and transfers during session and did not require physical assistance or cuing. Pt able to safely ambulate using RW and accepted challenges such as head turns, varying gait speeds and negotiating around obstacles. Pt did so well with balance and ambulation that she picked up her RW and began walking with it to show off. PT educated her on the importance of safety and why it is important to use the RW properly. Pt verbalized understanding. Pt does not require further acute PT needs and does not need follow up PT.      Follow Up Recommendations No PT follow up    Equipment Recommendations  None recommended by PT    Recommendations for Other Services       Precautions / Restrictions Precautions Precautions: Fall Restrictions Weight Bearing Restrictions: No      Mobility  Bed Mobility Overal bed mobility: Modified Independent             General bed mobility comments: Pt requiring incresed time to transition from supine to sitting EOB. Pt did not need cuing and was safe with transition.   Transfers Overall transfer level: Needs assistance Equipment used: Rolling walker (2 wheeled) Transfers: Sit to/from Stand Sit to Stand: Min guard         General transfer comment: Pt able to safely stand from lowered bed without physical assistance or cuing. Pt safe with transfer and showed no signs of being unstable and had no loss of balance.   Ambulation/Gait Ambulation/Gait assistance: Supervision Ambulation Distance (Feet): 200 Feet Assistive device: Rolling walker (2  wheeled) Gait Pattern/deviations: Step-through pattern;Decreased stride length   Gait velocity interpretation: at or above normal speed for age/gender General Gait Details: Pt able to safely ambulate in hallway with RW and accept challenges without loss of balance. Pt able to perform head turns during ambulation, vary gait speed, avoid obstacles and step over an object placed in front of her on the floor without difficulty and all while maintaining safety. Pt was feeling she confident that she picked up her walker and started walking with it stating "see, look what I can do". PT educated pt that even if she can do it, it doesn't mean that she should be doing it as it can compromise her safety. Pt acknowledged that walking with the RW correctly was the most safe.    Stairs            Wheelchair Mobility    Modified Rankin (Stroke Patients Only)       Balance Overall balance assessment: No apparent balance deficits (not formally assessed)                                           Pertinent Vitals/Pain Pain Assessment: No/denies pain Faces Pain Scale: Hurts a little bit Pain Location: RUQ Pain Intervention(s): Limited activity within patient's tolerance;Monitored during session    Home Living Family/patient expects to be discharged to:: Private residence Living Arrangements: Spouse/significant other (stepson  as well) Available Help at Discharge: Family;Available 24 hours/day Type of Home: House Home Access: Stairs to enter Entrance Stairs-Rails: None Entrance Stairs-Number of Steps: 1 Home Layout: One level Home Equipment: Walker - 4 wheels;Shower seat;Toilet riser;Hand held shower head      Prior Function Level of Independence: Needs assistance   Gait / Transfers Assistance Needed: reports assist with ambulation/transfers when she is weak; uses walker sometimes  ADL's / Homemaking Assistance Needed: assist with shower transfer and washing her back         Hand Dominance   Dominant Hand: Right    Extremity/Trunk Assessment   Upper Extremity Assessment: Generalized weakness           Lower Extremity Assessment: Defer to PT evaluation         Communication   Communication: No difficulties  Cognition Arousal/Alertness: Awake/alert Behavior During Therapy: WFL for tasks assessed/performed Overall Cognitive Status: Within Functional Limits for tasks assessed                      General Comments General comments (skin integrity, edema, etc.): Pt's PCP came in to check on pt during session.     Exercises        Assessment/Plan    PT Assessment Patent does not need any further PT services  PT Diagnosis Difficulty walking   PT Problem List    PT Treatment Interventions     PT Goals (Current goals can be found in the Care Plan section) Acute Rehab PT Goals Patient Stated Goal: not stated    Frequency     Barriers to discharge        Co-evaluation               End of Session Equipment Utilized During Treatment: Gait belt Activity Tolerance: Patient tolerated treatment well Patient left: in bed;with call bell/phone within reach;with family/visitor present Nurse Communication: Mobility status    Functional Assessment Tool Used: clinical judgement Functional Limitation: Mobility: Walking and moving around Mobility: Walking and Moving Around Current Status [phone](G8978): 0 percent impaired, limited or restricted Mobility: Walking and Moving Around Goal Status [phone](G8979): 0 percent impaired, limited or restricted Mobility: Walking and Moving Around Discharge Status [phone](G8980): 0 percent impaired, limited or restricted    Time: 1210-1229 PT Time Calculation (min) (ACUTE ONLY): 19 min   Charges:   PT Evaluation $Initial PT Evaluation Tier I: 1 Procedure PT Treatments $Gait Training: 8-22 mins   PT G Codes:   PT G-Codes **NOT FOR INPATIENT CLASS** Functional Assessment Tool Used: clinical  judgement Functional Limitation: Mobility: Walking and moving around Mobility: Walking and Moving Around Current Status ([id number](G8978): 0 percent impaired, limited or restricted Mobility: Walking and Moving Around Goal Status ([id number](G8979): 0 percent impaired, limited or restricted Mobility: Walking and Moving Around Discharge Status [phone](G8980): 0 percent impaired, limited or restricted    Saesha Llerenas, Eliseo GumKenneth V SPT 05/06/2014, 3:37 PM  [place] SpanielOlivia Hebert, SPT  Acute Rehabilitation [phone]  05/06/2014  [place] BingKen Donell Tomkins, PT [phone]  (pager)

## 2014-05-06 NOTE — Evaluation (Signed)
Occupational Therapy Evaluation Patient Details Name: Destiny Moore MRN: [id number] DOB: [birth year] Today's Date: 05/06/2014    History of Present Illness Pt is a 81 year old female presenting to hospital from PCP with RUQ pain accompanied by nausea. Pt had been told she had gallstones.    Clinical Impression   Pt admitted with above. Feel pt will benefit from acute OT to increase independence prior to d/c. Feel pt will be able to go home with no followup as family is there to assist.    Follow Up Recommendations  No OT follow up;Supervision - Intermittent    Equipment Recommendations  None recommended by OT    Recommendations for Other Services       Precautions / Restrictions Precautions Precautions: Fall Restrictions Weight Bearing Restrictions: No      Mobility Bed Mobility Overal bed mobility: Modified Independent               Transfers Overall transfer level: Needs assistance Equipment used: Rolling walker (2 wheeled) Transfers: Sit to/from Stand Sit to Stand: Min guard                  ADL Overall ADL's : Needs assistance/impaired     Grooming: Wash/dry face;Oral care;Set up;Supervision/safety;Standing   Upper Body Bathing: Set up;Supervision/ safety;Standing   Lower Body Bathing: Set up;Supervison/ safety;Sit to/from stand   Upper Body Dressing : Set up;Sitting   Lower Body Dressing: Set up;Supervision/safety;Sit to/from stand   Toilet Transfer: Data processing manager and Hygiene: Supervision/safety;Sit to/from stand   Tub/ Shower Transfer: Minimal assistance;Tub transfer;Ambulation (practiced stepping over)   Functional mobility during ADLs: Min guard;Minimal assistance;Moderate assistance (Min-Mod A in hallway) General ADL Comments: Educated on safety tips such as rugs, sitting for LB ADLs, and safe shoewear. Recommended someone be with her for tub transfer.      Vision  Pt wears reading  glasses; history of cataract surgery                   Perception     Praxis      Pertinent Vitals/Pain Pain Assessment: No/denies pain      Hand Dominance Right   Extremity/Trunk Assessment Upper Extremity Assessment Upper Extremity Assessment: Generalized weakness   Lower Extremity Assessment Lower Extremity Assessment: Defer to PT evaluation       Communication Communication Communication: No difficulties   Cognition Arousal/Alertness: Awake/alert Behavior During Therapy: WFL for tasks assessed/performed Overall Cognitive Status: Within Functional Limits for tasks assessed                     General Comments       Exercises       Shoulder Instructions      Home Living Family/patient expects to be discharged to:: Private residence Living Arrangements: Spouse/significant other (stepson as well) Available Help at Discharge: Family;Available 24 hours/day Type of Home: House Home Access: Stairs to enter [employer] of Steps: 1 Entrance Stairs-Rails: None Home Layout: One level     Bathroom Shower/Tub: Chief Strategy Officer: Handicapped height     Home Equipment: Environmental consultant - 4 wheels;Shower seat;Toilet riser;Hand held shower head          Prior Functioning/Environment Level of Independence: Needs assistance  Gait / Transfers Assistance Needed: reports assist with ambulation/transfers when she is weak; uses walker sometimes ADL's / Homemaking Assistance Needed: assist with shower transfer and washing her back  OT Diagnosis: Acute pain;Generalized weakness   OT Problem List: Decreased strength;Impaired balance (sitting and/or standing);Decreased knowledge of use of DME or AE   OT Treatment/Interventions: Self-care/ADL training;DME and/or AE instruction;Therapeutic activities;Patient/family education;Balance training;Therapeutic exercise    OT Goals(Current goals can be found in the care plan section) Acute  Rehab OT Goals Patient Stated Goal: not stated OT Goal Formulation: With patient Time For Goal Achievement: 05/13/14 Potential to Achieve Goals: Good ADL Goals Pt Will Perform Lower Body Bathing: with modified independence;sit to/from stand Pt Will Perform Lower Body Dressing: with modified independence;sit to/from stand Pt Will Transfer to Toilet: with modified independence;ambulating  OT Frequency: Min 2X/week   Barriers to D/C:            Co-evaluation              End of Session Equipment Utilized During Treatment: Gait belt Nurse Communication: Other (comment) (came in during session)  Activity Tolerance: Patient tolerated treatment well Patient left: in bed;with call bell/phone within reach;with bed alarm set   Time: 2841-32440928-0950 OT Time Calculation (min): 22 min Charges:  OT General Charges $OT Visit: 1 Procedure OT Evaluation $Initial OT Evaluation Tier I: 1 Procedure OT Treatments $Self Care/Home Management : 8-22 mins G-Codes: OT G-codes **NOT FOR INPATIENT CLASS** Functional Assessment Tool Used: clinical judgment Functional Limitation: Self care Self Care Current Status (W1027(G8987): At least 20 percent but less than 40 percent impaired, limited or restricted Self Care Goal Status ([id number](G8988): 0 percent impaired, limited or restricted  Earlie RavelingStraub, Elizabeth Haff L OTR/L [phone] 05/06/2014, 12:54 PM

## 2014-05-06 NOTE — Progress Notes (Signed)
[facility] PCP Visit - Progress Note  I have seen the patient and discussed current hospitalization. Destiny Moore is accompanied by her husband today at bedside. She appears significantly improved [facility] same complaint. Currently she reports doing better, pain is improved. As discussed, she is to have GI consult today to discuss further management options with planned ERCP vs MRCP. She understands, and both she and her husband voice their wishes to eventually proceed to some definitive management given the chronic recurrent nature of this problem.   I agree with the current hospital plan [facility], and pending further GI work-up and recommendations, eventual consult or scheduling of General Surgery follow-up seems appropriate at this time.  Additionally, I want to thank the [facility]PT[facility] for their continued excellent care and efforts in caring for my patient. I will anticipate to follow-up with the patient in the clinic after discharge from hospital.  Saralyn PilarAlexander Karamalegos, DO [facility], PGY-2

## 2014-05-06 NOTE — Progress Notes (Signed)
Patient arrived to unit via stretcher from ER.Patient assisted to bed by nursing staff.Patient oriented to unit.Patient denies pain at present time no acute distress noted .Will continue to monitor patient.

## 2014-05-06 NOTE — Consult Note (Signed)
[facility] Consult: 12:14 PM 05/06/2014  LOS: 1 day    Referring Provider: Dr Deirdre Priest  Primary Care Physician:  Saralyn Pilar, DO, FPTS Primary Gastroenterologist:  Gentry Fitz, had colonoscopy but doesn't recall the MDs name    Reason for Consultation:  Pain RUQ.    HPI: Destiny Moore is a 81 y.o. female.  Past medical history of HTN, HLD, GERD, CKD Stage 3,  Patient has had pain in the right upper to mid abdomen for approximately 6 months. It is persistent, at times it is more intense. It is exacerbated by consumption of fatty foods which she has been avoiding. Despite avoidance of fatty foods she is still suffering from the pain. She has some early satiety but no anorexia. Her weight is stable. No nausea or vomiting. Patient does not recall ever having had workup of the problem other than imaging studies. She has never been to see a GI specialist. She recalls a colonoscopy with possible polyps removed, however I do not locate any pathology or colonoscopy reports in Epic.  As far back as 05/2012 and ultrasound showed cholelithiasis and 8 mm CBD. Liver was described as coarse with mild intrahepatic duct dilatation.  A CT scan just before this had shown a 13 mm CBD, and a lesion in the right lobe of the liver.   However the ultrasound failed to demonstrate a liver lesion. 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 At the time, in January 2014, her transaminases, total bilirubin and alkaline phosphatase were not elevated. Subsequent measurements of her LFTs including yesterday and today also failed to demonstrate any LFT abnormalities.    CT scan repeated  02/27/14: CBD stable at 17 mm, colon tics, ? reflux changes in esophagus with contrast in distal esophagus, aortic calcifications.   Ultrasound 05/05/14: 1.3 cm CBD though no obvious obstruction.  Mild intrhepatic ductal dilatation.  Large stones in distended GB.   Past Medical History  Diagnosis Date  . Hypertension   . Hyperlipidemia   . Anxiety   . UTI (urinary tract infection)   . Depression   . Osteoporosis   . Allergy     Past Surgical History  Procedure Laterality Date  . Hystrectomy    . Bladder tact    . Abdominal hysterectomy    . Incontinence surgery      Prior to Admission medications   Medication Sig Start Date End Date Taking? Authorizing Provider  alendronate (FOSAMAX) 70 MG tablet Take 70 mg by mouth once a week. Take with a full glass of water on an empty stomach. On Monday   Yes Historical Provider, MD  amLODipine (NORVASC) 10 MG tablet Take 10 mg by mouth daily.   Yes Historical Provider, MD  aspirin 81 MG tablet Take 81 mg by mouth daily.   Yes Historical Provider, MD  B Complex-C (B-COMPLEX WITH VITAMIN C) tablet Take 1 tablet by mouth daily.   Yes Historical Provider,  MD  DUREZOL 0.05 % EMUL Place 1 drop into the right eye daily.  03/11/14  Yes Historical Provider, MD  EPINEPHrine (EPIPEN) 0.3 mg/0.3 mL DEVI Inject 0.3 mg into the muscle as needed (for allergic reaction).    Yes Historical Provider, MD  gabapentin (NEURONTIN) 100 MG capsule TAKE 3 CAPSULES BY MOUTH EVERY DAY AT BEDTIME 04/30/14  Yes Saralyn Pilar, DO  hydrochlorothiazide (MICROZIDE) 12.5 MG capsule Take 12.5 mg by mouth daily.   Yes Historical Provider, MD  pantoprazole (PROTONIX) 40 MG tablet Take 40 mg by mouth daily.   Yes Historical Provider, MD  ranitidine (ZANTAC) 150 MG tablet Take 1 tablet (150 mg total) by mouth 2 (two) times daily. 04/17/14  Yes Stephanie Coup Street, MD  simvastatin (ZOCOR) 20 MG tablet Take 20 mg by mouth daily.   Yes Historical Provider, MD    HYDROcodone-acetaminophen (NORCO/VICODIN) 5-325 MG per tablet Take 1 tablet by mouth every 8 (eight) hours as needed for moderate pain. Patient not taking: Reported on 04/12/2014 04/08/14   Jamesetta Orleans Lawyer, PA-C  loperamide (IMODIUM) 2 MG capsule Take 1 capsule (2 mg total) by mouth as needed for diarrhea or loose stools. Patient not taking: Reported on 04/12/2014 02/27/14   Shon Baton, MD  ondansetron (ZOFRAN) 4 MG tablet Take 1 tablet (4 mg total) by mouth every 8 (eight) hours as needed for nausea or vomiting. Patient not taking: Reported on 04/12/2014 02/27/14   Shon Baton, MD    Scheduled Meds: . amLODipine  10 mg Oral Daily  . aspirin  81 mg Oral Daily  . famotidine  20 mg Oral Daily  . gabapentin  300 mg Oral QHS  . heparin  5,000 Units Subcutaneous 3 times per day  . hydrochlorothiazide  12.5 mg Oral Daily  . pantoprazole  40 mg Oral Daily  . prednisoLONE acetate  1 drop Right Eye BID  . senna  1 tablet Oral BID  . simvastatin  20 mg Oral Daily   Infusions:   PRN Meds: acetaminophen **OR** acetaminophen, morphine injection, ondansetron   Allergies as of 05/05/2014 - Review Complete 05/05/2014  Allergen Reaction Noted  . Coreg Anaphylaxis 07/26/2010  . Lisinopril Anaphylaxis 06/17/2009  . Sertraline hcl Other (See Comments) 06/16/2011  . Carvedilol Other (See Comments) 06/17/2009    Family History  Problem Relation Age of Onset  . Heart disease Mother   . Cancer Sister     breast  . Cancer Brother     skin, kidney, lung    History   Social History  . Marital Status: Married    Spouse Name: Destiny Moore    Number of Children: 2  . Years of Education: 10   Occupational History  . Retired- Emergency planning/management officer    Social History Main Topics  . Smoking status: Never Smoker   . Smokeless tobacco: Never Used  . Alcohol Use: No  . Drug Use: No  . Sexual Activity: Not on file   Other Topics Concern  . Not on file   Social History  Narrative   Health Care POA:    Emergency Contact: Garnette Gunner [phone] (h)    End of Life Plan:    Who lives with you: Lives with husband,  step son Donyel Castagnola   Any pets: 4 cats   Diet: Patient has a varied diet of protein, starch, and vegetables.    Exercise: Patient does not have any regular exercise plan.  Does not like  to exercise by her self.   Seatbelts: Patient reports wearing seat belt when in vehicle.   Sun Exposure/Protection: Patient does not wear sun protection.   Hobbies: cooking, traveling, visiting family in [place]                 REVIEW OF SYSTEMS: Constitutional:  No significant weight fluctuation, no weakness. Fairly steady on her feet. ENT:  No nose bleeds Pulm:  No cough, no DOE, no PND CV:  No palpitations, no LE edema.  GU:  No hematuria, no frequency GI:  Per history of present illness. Heme:  No issues with low blood counts.   Transfusions:  None Neuro:  No headaches, no peripheral tingling or numbness Derm:  No itching, no rash or sores.  Endocrine:  No sweats or chills.  No polyuria or dysuria Immunization:  Reviewed Travel:  None beyond local counties in last few months.    PHYSICAL EXAM: Vital signs in last 24 hours: Filed Vitals:   05/06/14 0557  BP: 115/51  Pulse: 64  Temp: 98.5 F (36.9 C)  Resp: 15   Wt Readings from Last 3 Encounters:  05/05/14 154 lb (69.854 kg)  05/05/14 154 lb 12.8 oz (70.217 kg)  04/17/14 149 lb (67.586 kg)   General: Pleasant, well appearing elderly WF who is comfortable. Head:  No facial swelling, no asymmetry, no signs of head trauma.  Eyes:  No icterus, no conjunctival pallor. EOMI. Ears:  No hearing deficit.  Nose:  No congestion or discharge Mouth:  Clear, moist, no lesions or exudates. Neck:  No JVD, no bruits, no TMG. No masses Lungs:  CTA bil.  No cough, no dyspnea Heart: RRR. No MRG. Abdomen:  Soft, tender without guarding in the right upper/right mid abdomen. Bowel sounds active. No  HSM, no masses, no distention no bruits.   Rectal: Deferred   Musc/Skeltl: Node gross joint deformities or contractures. Extremities:  Nonpitting edema in the lower legs, no edema in the feet.  Neurologic:  Oriented 3. Alert. No tremor, no limb weakness or asymmetric strength. Able to sit up in bed without assistance. Walking slowly but steadily with walker in the hallway. Skin:  No telangiectasia, no rash, no sores. Tattoos:  None Nodes:  No cervical adenopathy.   Psych:  Pleasant, relaxed. Engaged.  Intake/Output from previous day: 01/04 0701 - 01/05 0700 In: 1065 [I.V.:1065] Out: 1100 [Urine:1100] Intake/Output this shift: Total I/O In: 0  Out: 400 [Urine:400]  LAB RESULTS:  Recent Labs  05/05/14 1446  WBC 6.4  HGB 12.8  HCT 38.3  PLT 243   BMET Lab Results  Component Value Date   NA 138 05/06/2014   NA 131* 05/05/2014   NA 137 04/12/2014   K 3.5 05/06/2014   K 3.6 05/05/2014   K 3.4* 04/12/2014   CL 106 05/06/2014   CL 98 05/05/2014   CL 100 04/12/2014   CO2 26 05/06/2014   CO2 22 05/05/2014   CO2 24 04/12/2014   GLUCOSE 84 05/06/2014   GLUCOSE 125* 05/05/2014   GLUCOSE 96 04/12/2014   BUN 19 05/06/2014   BUN 24* 05/05/2014   BUN 19 04/12/2014   CREATININE 1.82* 05/06/2014   CREATININE 2.07* 05/05/2014   CREATININE 1.53* 04/12/2014   CALCIUM 8.1* 05/06/2014   CALCIUM 8.6 05/05/2014   CALCIUM 8.7 04/12/2014   LFT  Recent Labs  05/05/14 1446 05/06/14 0255  PROT 7.9 5.9*  ALBUMIN 4.3 3.3*  AST 30 24  ALT 21 16  ALKPHOS 66 52  BILITOT 0.6 0.7   Lipase     Component Value Date/Time   LIPASE 28 05/05/2014 1446    Drugs of Abuse  No results found for: LABOPIA, COCAINSCRNUR, LABBENZ, AMPHETMU, THCU, LABBARB   RADIOLOGY STUDIES: [place] Abdomen Limited Ruq  05/05/2014   CLINICAL DATA:  Acute onset of generalized abdominal pain. Initial encounter.  EXAM: US ABDOMEN LIMITED - RIGHT UPPER QUADRANT  COMPARISON:  None.  FINDINGS: Gallbladder:  The  gallbladder is distended, and contains multiple large mobile stones, measuring up to 1.8 cm. No gallbladder wall thickening or pericholecystic fluid is seen. No ultrasonographic Murphy's sign is elicited.  Common bile duct:  Diameter: 1.3 cm. Significantly dilated, raising concern for distal obstruction.  Liver:  No focal lesion identified. Within normal limits in parenchymal echogenicity. Mild intrahepatic biliary ductal dilatation is suggested.  IMPRESSION: Significant dilatation of the common hepatic duct to 1.3 cm, raising concern for distal obstruction. Multiple large stones seen within the gallbladder; the gallbladder is distended, without evidence of cholecystitis. Mild intrahepatic biliary ductal dilatation also suggested. ERCP or MRCP could be considered for further evaluation.   Electronically Signed   By: Roanna RaiderJeffery  Chang M.D.   On: 05/05/2014 21:24    ENDOSCOPIC STUDIES: Patient recalls a colonoscopy probably more than 5 years ago but unable to locate any records associated with this  IMPRESSION:   *  Dilated CBD and lesser dilatation of intrahepatic ducts without obvious obstructing stone or masses on imaging. LFTs normal. Several months of right upper abdominal pain and fatty food intolerance.    PLAN:     *  MRCP per d/w Dr Russella DarStark.  Ok to have lunch and be NPO after 1500 today.  MRCP will be done this PM.   Jennye MoccasinSarah Gribbin  05/06/2014, 12:14 PM Pager: [phone]      Attending physician's note   I have taken a history, examined the patient and reviewed the chart. I agree with the Advanced Practitioner's note, impression and recommendations. Cholelithiasis, dilated CBD with possible intrahepatic duct dilation. LFTs are normal now and have been normal for several months which would be unusual for choledocholithiasis and CBD obstruction from any cause. Possible choledocholithiasis, CBD stricture vs nonspecific CBD dilation. MRCP to further evaluate. Please consult general surgery regarding  cholelithiasis.   Meryl DareMalcolm T Stark, MD Clementeen GrahamFACG

## 2014-05-07 ENCOUNTER — Encounter (HOSPITAL_COMMUNITY): Payer: Self-pay | Admitting: Physician Assistant

## 2014-05-07 DIAGNOSIS — K838 Other specified diseases of biliary tract: Secondary | ICD-10-CM | POA: Insufficient documentation

## 2014-05-07 LAB — COMPREHENSIVE METABOLIC PANEL
ALBUMIN: 3.1 g/dL — AB (ref 3.5–5.2)
ALK PHOS: 48 U/L (ref 39–117)
ALT: 16 U/L (ref 0–35)
ANION GAP: 9 (ref 5–15)
AST: 23 U/L (ref 0–37)
BILIRUBIN TOTAL: 0.6 mg/dL (ref 0.3–1.2)
BUN: 11 mg/dL (ref 6–23)
CALCIUM: 8.2 mg/dL — AB (ref 8.4–10.5)
CO2: 20 mmol/L (ref 19–32)
CREATININE: 1.38 mg/dL — AB (ref 0.50–1.10)
Chloride: 113 mEq/L — ABNORMAL HIGH (ref 96–112)
GFR calc Af Amer: 41 mL/min — ABNORMAL LOW (ref >90)
GFR calc non Af Amer: 35 mL/min — ABNORMAL LOW (ref >90)
Glucose, Bld: 84 mg/dL (ref 70–99)
Potassium: 3.4 mmol/L — ABNORMAL LOW (ref 3.5–5.1)
Sodium: 142 mmol/L (ref 135–145)
Total Protein: 5.8 g/dL — ABNORMAL LOW (ref 6.0–8.3)

## 2014-05-07 LAB — SURGICAL PCR SCREEN
MRSA, PCR: NEGATIVE
Staphylococcus aureus: POSITIVE — AB

## 2014-05-07 MED ORDER — POTASSIUM CHLORIDE CRYS ER 20 MEQ PO TBCR
40.0000 meq | EXTENDED_RELEASE_TABLET | Freq: Once | ORAL | Status: AC
  Administered 2014-05-07: 40 meq via ORAL
  Filled 2014-05-07: qty 2

## 2014-05-07 MED ORDER — CEFAZOLIN SODIUM-DEXTROSE 2-3 GM-% IV SOLR
2.0000 g | Freq: Once | INTRAVENOUS | Status: AC
  Administered 2014-05-07: 2 g via INTRAVENOUS
  Filled 2014-05-07: qty 50

## 2014-05-07 MED ORDER — MUSCLE RUB 10-15 % EX CREA
TOPICAL_CREAM | Freq: Two times a day (BID) | CUTANEOUS | Status: DC | PRN
  Filled 2014-05-07: qty 85

## 2014-05-07 MED ORDER — TROLAMINE SALICYLATE 10 % EX CREA
TOPICAL_CREAM | Freq: Two times a day (BID) | CUTANEOUS | Status: DC | PRN
  Filled 2014-05-07: qty 85

## 2014-05-07 NOTE — Progress Notes (Signed)
Family Medicine Teaching Service Daily Progress Note Intern Pager: [phone]  Patient name: Destiny Moore Medical record number: [id number] Date of birth: November 06, 1932 Age: 81 y.o. Gender: female  Primary Care Provider: Saralyn PilarKaramalegos, Alexander, DO Consultants: Gastroenterology Code Status: Full  Assessment and Plan: Destiny Moore is a 81 y.o. female presenting with RUQ pain concerning for common bile duct stone without acute cystitis. PMH is significant for GERD, HTN, Depression, CKD stage III, PTSD, Grade I diastolic CHF.  # RUQ Pain- Concern for Choledocholithiasis. Cholelithiasis noted on imaging. - AST 23, ALT 16 - Total Bilirubin 0.6 - US: significant dilatation of the common hepatic duct to 1.3cm concerning for distal obstruction. Multiple large stones in gallbladder. Gallbladder distended without evidence of cholecystitis. Mild intrahepatic biliary ductal dilatation. Negative murphy's - Diet advanced to Heart Healthy diet today. Fluids discontinued. - Acetaminophen PRN pain - GI consulted. Appreciate recommendations.   - MRCP- cholelithiasis without inflammatory changes, stable dilatation of common duct measuring 13mm. No choledocholithiasis.  - Recommend General Surgery Consult - General Surgery consulted. Appreciate recommendations. - PT and OT consult- no outpatient followup recommended  # Hyponatremia- Sodium 131 at admission. Improved to 138 yesterday. - Improved with Na 142 today - Continue to monitor  # Hypokalemia- Potassium 3.6 at admission - Potassium 3.4 today - Kdur 40mEq x1 - Continue to monitor  # CKD Stage III- Baseline Cr 1.3-1.5. Creatinine 2.07 at admission. - Creatinine improved to 1.38 today. - Fluids discontinued. Anticipate transition to diet today. - Continue to monitor  FEN/GI: Heart Healthy Prophylaxis: Subcutaneous Heparin  [facility] Discharge pending General Surgery consult.  Subjective:   No acute complaints overnight. States pain is improved today and exacerbated by movement. Went for MRCP late last night. Complains of chronic left shoulder pain that was made worse by trying to stay still for MRCP. No further complaints today.  Objective: Temp:  [98.5 F (36.9 C)-98.7 F (37.1 C)] 98.5 F (36.9 C) (01/06 0526) Pulse Rate:  [71-74] 71 (01/06 0526) Resp:  [14-16] 16 (01/06 0526) BP: (113-137)/(49-62) 122/62 mmHg (01/06 0526) SpO2:  [94 %-99 %] 97 % (01/06 0526) Physical Exam: General: 81yo female resting comfortably in no apparent distress Cardiovascular: S1 and S2 noted, no murmurs/rubs/gallops, regular rate and rhythm Respiratory: Clear to auscultation bilaterally. No wheezes/rales/rhonchi. No increased work of breathing. Abdomen: Bowel sounds noted. Soft and nondistended. Tenderness palpated in RUQ. Extremities: No edema noted.  Laboratory:  Recent Labs Lab 05/05/14 1446  WBC 6.4  HGB 12.8  HCT 38.3  PLT 243    Recent Labs Lab 05/05/14 1446 05/06/14 0255  NA 131* 138  K 3.6 3.5  CL 98 106  CO2 22 26  BUN 24* 19  CREATININE 2.07* 1.82*  CALCIUM 8.6 8.1*  PROT 7.9 5.9*  BILITOT 0.6 0.7  ALKPHOS 66 52  ALT 21 16  AST 30 24  GLUCOSE 125* 84   Urinalysis    Component Value Date/Time   COLORURINE YELLOW 05/05/2014 1754   APPEARANCEUR HAZY* 05/05/2014 1754   LABSPEC 1.009 05/05/2014 1754   PHURINE 5.5 05/05/2014 1754   GLUCOSEU NEGATIVE 05/05/2014 1754   HGBUR NEGATIVE 05/05/2014 1754   HGBUR negative 05/27/2010 1047   BILIRUBINUR NEGATIVE 05/05/2014 1754   BILIRUBINUR neg 09/21/2011 1218   KETONESUR NEGATIVE 05/05/2014 1754   PROTEINUR NEGATIVE 05/05/2014 1754   PROTEINUR neg 09/21/2011 1218   UROBILINOGEN 0.2 05/05/2014 1754   UROBILINOGEN 1.0 09/21/2011 1218   NITRITE NEGATIVE 05/05/2014 1754  NITRITE neg 09/21/2011 1218   LEUKOCYTESUR LARGE* 05/05/2014 1754   Imaging/Diagnostic Tests: US Abdomen Limited Ruq  05/05/2014   CLINICAL  DATA:  Acute onset of generalized abdominal pain. Initial encounter.  EXAM: US ABDOMEN LIMITED - RIGHT UPPER QUADRANT  COMPARISON:  None.  FINDINGS: Gallbladder:  The gallbladder is distended, and contains multiple large mobile stones, measuring up to 1.8 cm. No gallbladder wall thickening or pericholecystic fluid is seen. No ultrasonographic Murphy's sign is elicited.  Common bile duct:  Diameter: 1.3 cm. Significantly dilated, raising concern for distal obstruction.  Liver:  No focal lesion identified. Within normal limits in parenchymal echogenicity. Mild intrahepatic biliary ductal dilatation is suggested.  IMPRESSION: Significant dilatation of the common hepatic duct to 1.3 cm, raising concern for distal obstruction. Multiple large stones seen within the gallbladder; the gallbladder is distended, without evidence of cholecystitis. Mild intrahepatic biliary ductal dilatation also suggested. ERCP or MRCP could be considered for further evaluation.   Electronically Signed   By: Roanna Raider M.D.   On: 05/05/2014 21:24   Mr Abdomen Mrcp Wo Cm  05/07/2014   CLINICAL DATA:  Right upper quadrant/epigastric abdominal pain, CBD dilatation on ultrasound  EXAM: MRI ABDOMEN WITHOUT CONTRAST  (INCLUDING MRCP)  TECHNIQUE: Multiplanar multisequence MR imaging of the abdomen was performed. Heavily T2-weighted images of the biliary and pancreatic ducts were obtained, and three-dimensional MRCP images were rendered by post processing.  COMPARISON:  Right upper quadrant ultrasound dated 05/05/2014. CT abdomen pelvis dated 02/27/2014.  FINDINGS: Lower chest:  Lung bases are clear.  Hepatobiliary: 11 mm T2 hyperintense lesion in the posterior segment right hepatic lobe, incompletely characterized but unchanged from prior CT. No hepatic steatosis.  Distended gallbladder with layering gallstones. No associated inflammatory changes.  No intrahepatic ductal dilatation. Central common duct is dilated, measuring 13 mm, but unchanged  from prior CT and smoothly tapering at the ampulla. No choledocholithiasis.  Pancreas: Within normal limits.  No pancreatic ductal dilatation.  Spleen: Within normal limits.  Adrenals/Urinary Tract: Adrenal glands are unremarkable.  Bilateral renal cysts.  No hydronephrosis.  Stomach/Bowel: Stomach and visualized bowel are grossly unremarkable.  Vascular/Lymphatic: No evidence of abdominal aortic aneurysm.  No suspicious abdominal lymphadenopathy.  Other: No abdominal ascites.  Musculoskeletal: No focal osseous lesions.  IMPRESSION: Cholelithiasis, without associated inflammatory changes.  Stable dilatation the common duct, measuring 13 mm. No choledocholithiasis.   Electronically Signed   By: Charline Bills M.D.   On: 05/07/2014 09:38   Araceli Bouche, DO 05/07/2014, 6:45 AM PGY-1, [facility] FPTS Intern pager: [phone], text pages welcome

## 2014-05-07 NOTE — Progress Notes (Signed)
Daily Rounding Note  05/07/2014, 9:42 AM  LOS: 2 days   SUBJECTIVE:       No significant abdominal pain today.  Still NPO following MRCP.  Tolerated low fat diet at lunch. Hungry. Back of thighs got pinched by BS commode last night.   OBJECTIVE:         Vital signs in last 24 hours:    Temp:  [98.5 F (36.9 C)-98.7 F (37.1 C)] 98.5 F (36.9 C) (01/06 0526) Pulse Rate:  [71-74] 71 (01/06 0526) Resp:  [14-16] 16 (01/06 0526) BP: (113-137)/(49-62) 122/62 mmHg (01/06 0526) SpO2:  [94 %-99 %] 97 % (01/06 0526) Last BM Date: 05/05/14 Filed Weights   05/05/14 1435  Weight: 154 lb (69.854 kg)   General: looks well, comfortable. Esamined in lounge chair.  Heart: RRR Chest: clear bil.  No sob, no cough Abdomen: soft, NT, ND.  Extremities: No CCE Neuro/Psych:  Pleasant, oriented x 3.  Conversant.    Intake/Output from previous day: 01/05 0701 - 01/06 0700 In: 590 [I.V.:590] Out: 2100 [Urine:2100]  Intake/Output this shift:    Lab Results:  Recent Labs  05/05/14 1446  WBC 6.4  HGB 12.8  HCT 38.3  PLT 243   BMET  Recent Labs  05/05/14 1446 05/06/14 0255 05/07/14 0554  NA 131* 138 142  K 3.6 3.5 3.4*  CL 98 106 113*  CO2 22 26 20   GLUCOSE 125* 84 84  BUN 24* 19 11  CREATININE 2.07* 1.82* 1.38*  CALCIUM 8.6 8.1* 8.2*   LFT  Recent Labs  05/05/14 1446 05/06/14 0255 05/07/14 0554  PROT 7.9 5.9* 5.8*  ALBUMIN 4.3 3.3* 3.1*  AST 30 24 23   ALT 21 16 16   ALKPHOS 66 52 48  BILITOT 0.6 0.7 0.6   PT/INR No results for input(s): LABPROT, INR in the last 72 hours.  Studies/Results: Mr Abdomen Mrcp Wo Cm  05/07/2014   CLINICAL DATA:  Right upper quadrant/epigastric abdominal pain, CBD dilatation on ultrasound  EXAM: MRI ABDOMEN WITHOUT CONTRAST  (INCLUDING MRCP)  TECHNIQUE: Multiplanar multisequence MR imaging of the abdomen was performed. Heavily T2-weighted images of the biliary and pancreatic ducts  were obtained, and three-dimensional MRCP images were rendered by post processing.  COMPARISON:  Right upper quadrant ultrasound dated 05/05/2014. CT abdomen pelvis dated 02/27/2014.  FINDINGS: Lower chest:  Lung bases are clear.  Hepatobiliary: 11 mm T2 hyperintense lesion in the posterior segment right hepatic lobe, incompletely characterized but unchanged from prior CT. No hepatic steatosis.  Distended gallbladder with layering gallstones. No associated inflammatory changes.  No intrahepatic ductal dilatation. Central common duct is dilated, measuring 13 mm, but unchanged from prior CT and smoothly tapering at the ampulla. No choledocholithiasis.  Pancreas: Within normal limits.  No pancreatic ductal dilatation.  Spleen: Within normal limits.  Adrenals/Urinary Tract: Adrenal glands are unremarkable.  Bilateral renal cysts.  No hydronephrosis.  Stomach/Bowel: Stomach and visualized bowel are grossly unremarkable.  Vascular/Lymphatic: No evidence of abdominal aortic aneurysm.  No suspicious abdominal lymphadenopathy.  Other: No abdominal ascites.  Musculoskeletal: No focal osseous lesions.  IMPRESSION: Cholelithiasis, without associated inflammatory changes.  Stable dilatation the common duct, measuring 13 mm. No choledocholithiasis.   Electronically Signed   By: Charline BillsSriyesh  Krishnan M.D.   On: 05/07/2014 09:38   [place] Abdomen Limited Ruq  05/05/2014   CLINICAL DATA:  Acute onset of generalized abdominal pain. Initial encounter.  EXAM: US ABDOMEN LIMITED - RIGHT UPPER QUADRANT  COMPARISON:  None.  FINDINGS: Gallbladder:  The gallbladder is distended, and contains multiple large mobile stones, measuring up to 1.8 cm. No gallbladder wall thickening or pericholecystic fluid is seen. No ultrasonographic Murphy's sign is elicited.  Common bile duct:  Diameter: 1.3 cm. Significantly dilated, raising concern for distal obstruction.  Liver:  No focal lesion identified. Within normal limits in parenchymal echogenicity. Mild  intrahepatic biliary ductal dilatation is suggested.  IMPRESSION: Significant dilatation of the common hepatic duct to 1.3 cm, raising concern for distal obstruction. Multiple large stones seen within the gallbladder; the gallbladder is distended, without evidence of cholecystitis. Mild intrahepatic biliary ductal dilatation also suggested. ERCP or MRCP could be considered for further evaluation.   Electronically Signed   By: Roanna Raider M.D.   On: 05/05/2014 21:24    ASSESMENT:   * Dilated CBD and lesser dilatation of intrahepatic ducts without obvious obstructing stone or masses on imaging. LFTs normal. Several months of right upper abdominal pain and fatty food intolerance. MRCP with stable CBD dilatation and multiple gallstones.    *  Stage 3 CKD.  Creatinine improved.    PLAN   *  Needs gen surg evalulation.  Wrote for low fat diet.     Jennye Moccasin  05/07/2014, 9:42 AM Pager: [phone]

## 2014-05-07 NOTE — Consult Note (Signed)
[facility] Surgery Consult Note  IRA BUSBIN [birth year]  [id number].    Requesting MD: Dr. Mingo Amber Chief Complaint/Reason for Consult: symptomatic cholelithiasis  HPI:  81 y/o white female with PMH of HTN, HLD, GERD, CKD Stage III presents to [facility] on 05/05/13 due to RUQ abdominal pain which she's had somewhere between 84moand 1 year on and off.  She has h/o known gallstones.  She has had a few days of acutely worsening RUQ and epigastric abdominal pain.  She was seen by her family medicine clinic and was found to have shaking chills and abdominal pain.  She was referred to the ED for further evaluation.  She says fatty foods seems to exacerbate her symptoms, but now even with avoidance she has symptoms.  She c/o frequent burping, nausea, subjective fevers/chills, but no vomiting.  Pt says she has trouble urinating due to retention and sometimes overactive bladder.  C/o chronic constipation as well.  No alleviating factors this time and no radiation pain.  Pain resolved in ED after IV pain meds.  H/o total hysterectomy and bladder sling.  ROS: All systems reviewed and otherwise negative except for as above  Family History  Problem Relation Age of Onset  . Heart disease Mother   . Cancer Sister     breast  . Cancer Brother     skin, kidney, lung    Past Medical History  Diagnosis Date  . Hypertension   . Hyperlipidemia   . Anxiety   . UTI (urinary tract infection)   . Depression   . Osteoporosis   . Allergy   . CKD (chronic kidney disease)     stage 3 as of 04/2014.     Past Surgical History  Procedure Laterality Date  . Hystrectomy    . Bladder tact    . Abdominal hysterectomy    . Incontinence surgery      Social History:  reports that she has never smoked. She has never used smokeless tobacco. She reports that she does not drink alcohol or use illicit drugs.  Allergies:  Allergies  Allergen Reactions  . Coreg Anaphylaxis  . Lisinopril Anaphylaxis     REACTION: Anaphylaxis  . Sertraline Hcl Other (See Comments)    Could not feed herself  . Carvedilol Other (See Comments)    Unknown     Medications Prior to Admission  Medication Sig Dispense Refill  . alendronate (FOSAMAX) 70 MG tablet Take 70 mg by mouth once a week. Take with a full glass of water on an empty stomach. On Monday    . amLODipine (NORVASC) 10 MG tablet Take 10 mg by mouth daily.    .Marland Kitchenaspirin 81 MG tablet Take 81 mg by mouth daily.    . B Complex-C (B-COMPLEX WITH VITAMIN C) tablet Take 1 tablet by mouth daily.    . DUREZOL 0.05 % EMUL Place 1 drop into the right eye daily.   0  . EPINEPHrine (EPIPEN) 0.3 mg/0.3 mL DEVI Inject 0.3 mg into the muscle as needed (for allergic reaction).     . gabapentin (NEURONTIN) 100 MG capsule TAKE 3 CAPSULES BY MOUTH EVERY DAY AT BEDTIME 90 capsule 3  . hydrochlorothiazide (MICROZIDE) 12.5 MG capsule Take 12.5 mg by mouth daily.    . pantoprazole (PROTONIX) 40 MG tablet Take 40 mg by mouth daily.    . ranitidine (ZANTAC) 150 MG tablet Take 1 tablet (150 mg total) by mouth 2 (two) times daily. 60 tablet 1  .  simvastatin (ZOCOR) 20 MG tablet Take 20 mg by mouth daily.    Marland Kitchen HYDROcodone-acetaminophen (NORCO/VICODIN) 5-325 MG per tablet Take 1 tablet by mouth every 8 (eight) hours as needed for moderate pain. (Patient not taking: Reported on 04/12/2014) 15 tablet 0  . loperamide (IMODIUM) 2 MG capsule Take 1 capsule (2 mg total) by mouth as needed for diarrhea or loose stools. (Patient not taking: Reported on 04/12/2014) 30 capsule 0  . ondansetron (ZOFRAN) 4 MG tablet Take 1 tablet (4 mg total) by mouth every 8 (eight) hours as needed for nausea or vomiting. (Patient not taking: Reported on 04/12/2014) 20 tablet 0    Blood pressure 122/62, pulse 71, temperature 98.5 F (36.9 C), temperature source Oral, resp. rate 16, height _0  (1.575 m), weight 154 lb (69.854 kg), SpO2 97 %. Physical Exam: General: pleasant, WD/WN white female who is  laying in bed in NAD HEENT: head is normocephalic, atraumatic.  Sclera are noninjected.  PERRL.  Ears and nose without any masses or lesions.  Mouth is pink and moist Heart: regular, rate, and rhythm.  No obvious murmurs, gallops, or rubs noted.  Palpable pedal pulses bilaterally Lungs: CTAB, no wheezes, rhonchi, or rales noted.  Respiratory effort nonlabored Abd: soft, ND, tender in the upper abdomen at epigastrium and RUQ, +BS, no masses, hernias, or organomegaly, hysterectomy scar noted and well healed MS: all 4 extremities are symmetrical with no cyanosis, clubbing, or edema. Skin: warm and dry with no masses, lesions, or rashes Psych: A&Ox3 with an appropriate affect.   Results for orders placed or performed during the hospital encounter of 05/05/14 (from the past 48 hour(s))  CBC with Differential     Status: None   Collection Time: 05/05/14  2:46 PM  Result Value Ref Range   WBC 6.4 4.0 - 10.5 K/uL   RBC 4.31 3.87 - 5.11 MIL/uL   Hemoglobin 12.8 12.0 - 15.0 g/dL   HCT 38.3 36.0 - 46.0 %   MCV 88.9 78.0 - 100.0 fL   MCH 29.7 26.0 - 34.0 pg   MCHC 33.4 30.0 - 36.0 g/dL   RDW 13.3 11.5 - 15.5 %   Platelets 243 150 - 400 K/uL   Neutrophils Relative % 69 43 - 77 %   Neutro Abs 4.5 1.7 - 7.7 K/uL   Lymphocytes Relative 21 12 - 46 %   Lymphs Abs 1.4 0.7 - 4.0 K/uL   Monocytes Relative 8 3 - 12 %   Monocytes Absolute 0.5 0.1 - 1.0 K/uL   Eosinophils Relative 1 0 - 5 %   Eosinophils Absolute 0.0 0.0 - 0.7 K/uL   Basophils Relative 1 0 - 1 %   Basophils Absolute 0.0 0.0 - 0.1 K/uL  Comprehensive metabolic panel     Status: Abnormal   Collection Time: 05/05/14  2:46 PM  Result Value Ref Range   Sodium 131 (L) 135 - 145 mmol/L    Comment: Please note change in reference range.   Potassium 3.6 3.5 - 5.1 mmol/L    Comment: Please note change in reference range.   Chloride 98 96 - 112 mEq/L   CO2 22 19 - 32 mmol/L   Glucose, Bld 125 (H) 70 - 99 mg/dL   BUN 24 (H) 6 - 23 mg/dL    Creatinine, Ser 2.07 (H) 0.50 - 1.10 mg/dL   Calcium 8.6 8.4 - 10.5 mg/dL   Total Protein 7.9 6.0 - 8.3 g/dL   Albumin 4.3 3.5 - 5.2 g/dL  AST 30 0 - 37 U/L   ALT 21 0 - 35 U/L   Alkaline Phosphatase 66 39 - 117 U/L   Total Bilirubin 0.6 0.3 - 1.2 mg/dL   GFR calc non Af Amer 22 (L) >90 mL/min   GFR calc Af Amer 25 (L) >90 mL/min    Comment: (NOTE) The eGFR has been calculated using the CKD EPI equation. This calculation has not been validated in all clinical situations. eGFR's persistently <90 mL/min signify possible Chronic Kidney Disease.    Anion gap 11 5 - 15  Lipase, blood     Status: None   Collection Time: 05/05/14  2:46 PM  Result Value Ref Range   Lipase 28 11 - 59 U/L  Urinalysis, Routine w reflex microscopic     Status: Abnormal   Collection Time: 05/05/14  5:54 PM  Result Value Ref Range   Color, Urine YELLOW YELLOW   APPearance HAZY (A) CLEAR   Specific Gravity, Urine 1.009 1.005 - 1.030   pH 5.5 5.0 - 8.0   Glucose, UA NEGATIVE NEGATIVE mg/dL   Hgb urine dipstick NEGATIVE NEGATIVE   Bilirubin Urine NEGATIVE NEGATIVE   Ketones, ur NEGATIVE NEGATIVE mg/dL   Protein, ur NEGATIVE NEGATIVE mg/dL   Urobilinogen, UA 0.2 0.0 - 1.0 mg/dL   Nitrite NEGATIVE NEGATIVE   Leukocytes, UA LARGE (A) NEGATIVE  Urine microscopic-add on     Status: Abnormal   Collection Time: 05/05/14  5:54 PM  Result Value Ref Range   Squamous Epithelial / LPF MANY (A) RARE   WBC, UA 21-50 <3 WBC/hpf   Bacteria, UA FEW (A) RARE  Comprehensive metabolic panel     Status: Abnormal   Collection Time: 05/06/14  2:55 AM  Result Value Ref Range   Sodium 138 135 - 145 mmol/L    Comment: Please note change in reference range. DELTA CHECK NOTED    Potassium 3.5 3.5 - 5.1 mmol/L    Comment: Please note change in reference range.   Chloride 106 96 - 112 mEq/L    Comment: DELTA CHECK NOTED   CO2 26 19 - 32 mmol/L   Glucose, Bld 84 70 - 99 mg/dL   BUN 19 6 - 23 mg/dL   Creatinine, Ser 1.82 (H)  0.50 - 1.10 mg/dL   Calcium 8.1 (L) 8.4 - 10.5 mg/dL   Total Protein 5.9 (L) 6.0 - 8.3 g/dL   Albumin 3.3 (L) 3.5 - 5.2 g/dL   AST 24 0 - 37 U/L   ALT 16 0 - 35 U/L   Alkaline Phosphatase 52 39 - 117 U/L   Total Bilirubin 0.7 0.3 - 1.2 mg/dL   GFR calc non Af Amer 25 (L) >90 mL/min   GFR calc Af Amer 29 (L) >90 mL/min    Comment: (NOTE) The eGFR has been calculated using the CKD EPI equation. This calculation has not been validated in all clinical situations. eGFR's persistently <90 mL/min signify possible Chronic Kidney Disease.    Anion gap 6 5 - 15  Comprehensive metabolic panel     Status: Abnormal   Collection Time: 05/07/14  5:54 AM  Result Value Ref Range   Sodium 142 135 - 145 mmol/L    Comment: Please note change in reference range.   Potassium 3.4 (L) 3.5 - 5.1 mmol/L    Comment: Please note change in reference range.   Chloride 113 (H) 96 - 112 mEq/L   CO2 20 19 - 32 mmol/L   Glucose,  Bld 84 70 - 99 mg/dL   BUN 11 6 - 23 mg/dL   Creatinine, Ser 1.38 (H) 0.50 - 1.10 mg/dL   Calcium 8.2 (L) 8.4 - 10.5 mg/dL   Total Protein 5.8 (L) 6.0 - 8.3 g/dL   Albumin 3.1 (L) 3.5 - 5.2 g/dL   AST 23 0 - 37 U/L   ALT 16 0 - 35 U/L   Alkaline Phosphatase 48 39 - 117 U/L   Total Bilirubin 0.6 0.3 - 1.2 mg/dL   GFR calc non Af Amer 35 (L) >90 mL/min   GFR calc Af Amer 41 (L) >90 mL/min    Comment: (NOTE) The eGFR has been calculated using the CKD EPI equation. This calculation has not been validated in all clinical situations. eGFR's persistently <90 mL/min signify possible Chronic Kidney Disease.    Anion gap 9 5 - 15   Mr Abdomen Mrcp Wo Cm  05/07/2014   CLINICAL DATA:  Right upper quadrant/epigastric abdominal pain, CBD dilatation on ultrasound  EXAM: MRI ABDOMEN WITHOUT CONTRAST  (INCLUDING MRCP)  TECHNIQUE: Multiplanar multisequence MR imaging of the abdomen was performed. Heavily T2-weighted images of the biliary and pancreatic ducts were obtained, and three-dimensional  MRCP images were rendered by post processing.  COMPARISON:  Right upper quadrant ultrasound dated 05/05/2014. CT abdomen pelvis dated 02/27/2014.  FINDINGS: Lower chest:  Lung bases are clear.  Hepatobiliary: 11 mm T2 hyperintense lesion in the posterior segment right hepatic lobe, incompletely characterized but unchanged from prior CT. No hepatic steatosis.  Distended gallbladder with layering gallstones. No associated inflammatory changes.  No intrahepatic ductal dilatation. Central common duct is dilated, measuring 13 mm, but unchanged from prior CT and smoothly tapering at the ampulla. No choledocholithiasis.  Pancreas: Within normal limits.  No pancreatic ductal dilatation.  Spleen: Within normal limits.  Adrenals/Urinary Tract: Adrenal glands are unremarkable.  Bilateral renal cysts.  No hydronephrosis.  Stomach/Bowel: Stomach and visualized bowel are grossly unremarkable.  Vascular/Lymphatic: No evidence of abdominal aortic aneurysm.  No suspicious abdominal lymphadenopathy.  Other: No abdominal ascites.  Musculoskeletal: No focal osseous lesions.  IMPRESSION: Cholelithiasis, without associated inflammatory changes.  Stable dilatation the common duct, measuring 13 mm. No choledocholithiasis.   Electronically Signed   By: Julian Hy M.D.   On: 05/07/2014 09:38   US Abdomen Limited Ruq  05/05/2014   CLINICAL DATA:  Acute onset of generalized abdominal pain. Initial encounter.  EXAM: US ABDOMEN LIMITED - RIGHT UPPER QUADRANT  COMPARISON:  None.  FINDINGS: Gallbladder:  The gallbladder is distended, and contains multiple large mobile stones, measuring up to 1.8 cm. No gallbladder wall thickening or pericholecystic fluid is seen. No ultrasonographic Murphy's sign is elicited.  Common bile duct:  Diameter: 1.3 cm. Significantly dilated, raising concern for distal obstruction.  Liver:  No focal lesion identified. Within normal limits in parenchymal echogenicity. Mild intrahepatic biliary ductal dilatation  is suggested.  IMPRESSION: Significant dilatation of the common hepatic duct to 1.3 cm, raising concern for distal obstruction. Multiple large stones seen within the gallbladder; the gallbladder is distended, without evidence of cholecystitis. Mild intrahepatic biliary ductal dilatation also suggested. ERCP or MRCP could be considered for further evaluation.   Electronically Signed   By: Garald Balding M.D.   On: 05/05/2014 21:24      Assessment/Plan Symptomatic cholelithiasis H/o HTN, HLD, GERD, CKD Stage III   Plan: 1.  Patient has symptomatic cholelithiasis without evidence of biliary obstruction per MRCP.  She does have a dilated CBD, but  no stones were seen.  Her LFT's and WBC remain normal.  The patient prefers to stay and have her gallbladder out while she's here.  She has already eaten applesauce and juice today after returning from the MRCP, so no procedure could be done today.  Will discuss with Dr. Georgette Dover whether he can operate on her while she's here or to send her to the office for elective surgery.   2.  NPO after MN, IVF, pain control, antiemetics, no need for abx currently 3.  SCD's and hold aspirin and heparin after MN in case 4.  Ambulate and IS 5.  Discussed plan with patient and her husband at bedside.   Coralie Keens, [place] [facility] Surgery 05/07/2014, 12:31 PM Pager: [phone]

## 2014-05-08 ENCOUNTER — Inpatient Hospital Stay (HOSPITAL_COMMUNITY): Payer: Medicare Other

## 2014-05-08 ENCOUNTER — Encounter (HOSPITAL_COMMUNITY): Payer: Self-pay | Admitting: Certified Registered Nurse Anesthetist

## 2014-05-08 ENCOUNTER — Inpatient Hospital Stay (HOSPITAL_COMMUNITY): Payer: Medicare Other | Admitting: Certified Registered Nurse Anesthetist

## 2014-05-08 ENCOUNTER — Encounter (HOSPITAL_COMMUNITY)
Admission: EM | Disposition: A | Discharge status: Home / Self Care | Payer: Self-pay | Source: Home / Self Care | Attending: Family Medicine

## 2014-05-08 HISTORY — PX: CHOLECYSTECTOMY: SHX55

## 2014-05-08 LAB — BASIC METABOLIC PANEL
ANION GAP: 10 (ref 5–15)
BUN: 12 mg/dL (ref 6–23)
CALCIUM: 9.7 mg/dL (ref 8.4–10.5)
CO2: 21 mmol/L (ref 19–32)
Chloride: 111 mEq/L (ref 96–112)
Creatinine, Ser: 1.37 mg/dL — ABNORMAL HIGH (ref 0.50–1.10)
GFR calc Af Amer: 41 mL/min — ABNORMAL LOW (ref >90)
GFR calc non Af Amer: 36 mL/min — ABNORMAL LOW (ref >90)
Glucose, Bld: 100 mg/dL — ABNORMAL HIGH (ref 70–99)
Potassium: 4.3 mmol/L (ref 3.5–5.1)
SODIUM: 142 mmol/L (ref 135–145)

## 2014-05-08 LAB — CBC
HEMATOCRIT: 41.2 % (ref 36.0–46.0)
Hemoglobin: 13.9 g/dL (ref 12.0–15.0)
MCH: 30.6 pg (ref 26.0–34.0)
MCHC: 33.7 g/dL (ref 30.0–36.0)
MCV: 90.7 fL (ref 78.0–100.0)
Platelets: 238 10*3/uL (ref 150–400)
RBC: 4.54 MIL/uL (ref 3.87–5.11)
RDW: 13.5 % (ref 11.5–15.5)
WBC: 5 10*3/uL (ref 4.0–10.5)

## 2014-05-08 SURGERY — LAPAROSCOPIC CHOLECYSTECTOMY WITH INTRAOPERATIVE CHOLANGIOGRAM
Anesthesia: General | Site: Abdomen

## 2014-05-08 MED ORDER — GLYCOPYRROLATE 0.2 MG/ML IJ SOLN
INTRAMUSCULAR | Status: AC
  Filled 2014-05-08: qty 4

## 2014-05-08 MED ORDER — ONDANSETRON HCL 4 MG/2ML IJ SOLN
INTRAMUSCULAR | Status: AC
  Filled 2014-05-08: qty 2

## 2014-05-08 MED ORDER — CEFAZOLIN SODIUM-DEXTROSE 2-3 GM-% IV SOLR
INTRAVENOUS | Status: DC | PRN
  Administered 2014-05-08: 2 g via INTRAVENOUS

## 2014-05-08 MED ORDER — FENTANYL CITRATE 0.05 MG/ML IJ SOLN
25.0000 ug | INTRAMUSCULAR | Status: DC | PRN
Start: 2014-05-08 — End: 2014-05-08
  Administered 2014-05-08 (×2): 25 ug via INTRAVENOUS

## 2014-05-08 MED ORDER — BUPIVACAINE-EPINEPHRINE (PF) 0.25% -1:200000 IJ SOLN
INTRAMUSCULAR | Status: AC
  Filled 2014-05-08: qty 30

## 2014-05-08 MED ORDER — ONDANSETRON HCL 4 MG/2ML IJ SOLN
INTRAMUSCULAR | Status: AC
  Administered 2014-05-08: 4 mg
  Filled 2014-05-08: qty 2

## 2014-05-08 MED ORDER — SODIUM CHLORIDE 0.9 % IR SOLN
Status: DC | PRN
  Administered 2014-05-08: 1000 mL

## 2014-05-08 MED ORDER — CHLORHEXIDINE GLUCONATE CLOTH 2 % EX PADS
6.0000 | MEDICATED_PAD | Freq: Every day | CUTANEOUS | Status: DC
Start: 2014-05-08 — End: 2014-05-09
  Administered 2014-05-08 – 2014-05-09 (×2): 6 via TOPICAL

## 2014-05-08 MED ORDER — BUPIVACAINE-EPINEPHRINE 0.25% -1:200000 IJ SOLN
INTRAMUSCULAR | Status: DC | PRN
  Administered 2014-05-08: 30 mL

## 2014-05-08 MED ORDER — MIDAZOLAM HCL 2 MG/2ML IJ SOLN
INTRAMUSCULAR | Status: AC
  Filled 2014-05-08: qty 2

## 2014-05-08 MED ORDER — SODIUM CHLORIDE 0.9 % IV SOLN
INTRAVENOUS | Status: DC | PRN
  Administered 2014-05-08: 50 mL

## 2014-05-08 MED ORDER — FENTANYL CITRATE 0.05 MG/ML IJ SOLN
INTRAMUSCULAR | Status: AC
  Administered 2014-05-08: 25 ug via INTRAVENOUS
  Filled 2014-05-08: qty 2

## 2014-05-08 MED ORDER — HYDROCODONE-ACETAMINOPHEN 5-325 MG PO TABS
1.0000 | ORAL_TABLET | ORAL | Status: DC | PRN
  Administered 2014-05-08: 1 via ORAL
  Filled 2014-05-08: qty 1

## 2014-05-08 MED ORDER — ENOXAPARIN SODIUM 40 MG/0.4ML ~~LOC~~ SOLN
40.0000 mg | SUBCUTANEOUS | Status: DC
  Administered 2014-05-09: 40 mg via SUBCUTANEOUS
  Filled 2014-05-08 (×2): qty 0.4

## 2014-05-08 MED ORDER — ROCURONIUM BROMIDE 100 MG/10ML IV SOLN
INTRAVENOUS | Status: DC | PRN
Start: 2014-05-08 — End: 2014-05-08
  Administered 2014-05-08: 20 mg via INTRAVENOUS

## 2014-05-08 MED ORDER — EPHEDRINE SULFATE 50 MG/ML IJ SOLN
INTRAMUSCULAR | Status: AC
  Filled 2014-05-08: qty 1

## 2014-05-08 MED ORDER — NEOSTIGMINE METHYLSULFATE 10 MG/10ML IV SOLN
INTRAVENOUS | Status: DC | PRN
  Administered 2014-05-08: 4.5 mg via INTRAVENOUS

## 2014-05-08 MED ORDER — EPHEDRINE SULFATE 50 MG/ML IJ SOLN
INTRAMUSCULAR | Status: DC | PRN
  Administered 2014-05-08: 5 mg via INTRAVENOUS

## 2014-05-08 MED ORDER — NEOSTIGMINE METHYLSULFATE 10 MG/10ML IV SOLN
INTRAVENOUS | Status: AC
  Filled 2014-05-08: qty 1

## 2014-05-08 MED ORDER — LIDOCAINE HCL (CARDIAC) 20 MG/ML IV SOLN
INTRAVENOUS | Status: AC
  Filled 2014-05-08: qty 5

## 2014-05-08 MED ORDER — DEXAMETHASONE SODIUM PHOSPHATE 4 MG/ML IJ SOLN
INTRAMUSCULAR | Status: AC
  Filled 2014-05-08: qty 1

## 2014-05-08 MED ORDER — GLYCOPYRROLATE 0.2 MG/ML IJ SOLN
INTRAMUSCULAR | Status: DC | PRN
  Administered 2014-05-08: .8 mg via INTRAVENOUS

## 2014-05-08 MED ORDER — FENTANYL CITRATE 0.05 MG/ML IJ SOLN
INTRAMUSCULAR | Status: DC | PRN
  Administered 2014-05-08: 25 ug via INTRAVENOUS
  Administered 2014-05-08: 50 ug via INTRAVENOUS

## 2014-05-08 MED ORDER — PROPOFOL 10 MG/ML IV BOLUS
INTRAVENOUS | Status: AC
  Filled 2014-05-08: qty 20

## 2014-05-08 MED ORDER — ONDANSETRON HCL 4 MG/2ML IJ SOLN: 4.0000 mg | Freq: Once | INTRAMUSCULAR | Status: DC | PRN

## 2014-05-08 MED ORDER — ONDANSETRON HCL 4 MG/2ML IJ SOLN
INTRAMUSCULAR | Status: DC | PRN
Start: 2014-05-08 — End: 2014-05-08
  Administered 2014-05-08: 4 mg via INTRAVENOUS

## 2014-05-08 MED ORDER — PROPOFOL 10 MG/ML IV BOLUS
INTRAVENOUS | Status: DC | PRN
  Administered 2014-05-08: 100 mg via INTRAVENOUS

## 2014-05-08 MED ORDER — 0.9 % SODIUM CHLORIDE (POUR BTL) OPTIME
TOPICAL | Status: DC | PRN
  Administered 2014-05-08: 1000 mL

## 2014-05-08 MED ORDER — LIDOCAINE HCL (CARDIAC) 20 MG/ML IV SOLN
INTRAVENOUS | Status: DC | PRN
  Administered 2014-05-08: 60 mg via INTRAVENOUS

## 2014-05-08 MED ORDER — ROCURONIUM BROMIDE 50 MG/5ML IV SOLN
INTRAVENOUS | Status: AC
Start: 2014-05-08 — End: 2014-05-08
  Filled 2014-05-08: qty 1

## 2014-05-08 MED ORDER — FENTANYL CITRATE 0.05 MG/ML IJ SOLN
INTRAMUSCULAR | Status: AC
  Filled 2014-05-08: qty 5

## 2014-05-08 MED ORDER — LACTATED RINGERS IV SOLN
INTRAVENOUS | Status: DC | PRN
  Administered 2014-05-08: 09:00:00 via INTRAVENOUS

## 2014-05-08 MED ORDER — STERILE WATER FOR INJECTION IJ SOLN
INTRAMUSCULAR | Status: AC
  Filled 2014-05-08: qty 10

## 2014-05-08 MED ORDER — MUPIROCIN 2 % EX OINT
1.0000 "application " | TOPICAL_OINTMENT | Freq: Two times a day (BID) | CUTANEOUS | Status: DC
  Administered 2014-05-08 (×3): 1 via NASAL
  Filled 2014-05-08: qty 22

## 2014-05-08 MED ORDER — DEXAMETHASONE SODIUM PHOSPHATE 4 MG/ML IJ SOLN
INTRAMUSCULAR | Status: DC | PRN
  Administered 2014-05-08: 4 mg via INTRAVENOUS

## 2014-05-08 MED ORDER — LACTATED RINGERS IV SOLN
INTRAVENOUS | Status: DC
  Administered 2014-05-08: 50 mL/h via INTRAVENOUS

## 2014-05-08 SURGICAL SUPPLY — 47 items
APPLIER CLIP ROT 10 11.4 M/L (STAPLE) ×2
BENZOIN TINCTURE PRP APPL 2/3 (GAUZE/BANDAGES/DRESSINGS) ×2 IMPLANT
CANISTER SUCTION 2500CC (MISCELLANEOUS) ×2 IMPLANT
CHLORAPREP W/TINT 26ML (MISCELLANEOUS) ×2 IMPLANT
CLIP APPLIE ROT 10 11.4 M/L (STAPLE) ×1 IMPLANT
CLSR STERI-STRIP ANTIMIC 1/2X4 (GAUZE/BANDAGES/DRESSINGS) ×2 IMPLANT
COVER MAYO STAND STRL (DRAPES) ×2 IMPLANT
COVER SURGICAL LIGHT HANDLE (MISCELLANEOUS) ×2 IMPLANT
DRAPE C-ARM 42X72 X-RAY (DRAPES) ×2 IMPLANT
DRAPE LAPAROSCOPIC ABDOMINAL (DRAPES) ×2 IMPLANT
DRSG TEGADERM 2-3/8X2-3/4 SM (GAUZE/BANDAGES/DRESSINGS) ×2 IMPLANT
DRSG TEGADERM 4X4.75 (GAUZE/BANDAGES/DRESSINGS) ×2 IMPLANT
ELECT REM PT RETURN 9FT ADLT (ELECTROSURGICAL) ×2
ELECTRODE REM PT RTRN 9FT ADLT (ELECTROSURGICAL) ×1 IMPLANT
FILTER SMOKE EVAC LAPAROSHD (FILTER) ×2 IMPLANT
GAUZE SPONGE 2X2 8PLY STRL LF (GAUZE/BANDAGES/DRESSINGS) ×1 IMPLANT
GLOVE BIO SURGEON STRL SZ7 (GLOVE) ×2 IMPLANT
GLOVE BIOGEL PI IND STRL 7.0 (GLOVE) ×2 IMPLANT
GLOVE BIOGEL PI IND STRL 7.5 (GLOVE) ×2 IMPLANT
GLOVE BIOGEL PI IND STRL 8 (GLOVE) ×1 IMPLANT
GLOVE BIOGEL PI INDICATOR 7.0 (GLOVE) ×2
GLOVE BIOGEL PI INDICATOR 7.5 (GLOVE) ×2
GLOVE BIOGEL PI INDICATOR 8 (GLOVE) ×1
GLOVE ECLIPSE 7.5 STRL STRAW (GLOVE) ×2 IMPLANT
GLOVE SS N UNI LF 8.0 STRL (GLOVE) ×2 IMPLANT
GLOVE SURG SS PI 7.0 STRL IVOR (GLOVE) ×2 IMPLANT
GOWN STRL REUS W/ TWL LRG LVL3 (GOWN DISPOSABLE) ×3 IMPLANT
GOWN STRL REUS W/TWL LRG LVL3 (GOWN DISPOSABLE) ×3
KIT BASIN OR (CUSTOM PROCEDURE TRAY) ×2 IMPLANT
KIT ROOM TURNOVER OR (KITS) ×2 IMPLANT
NS IRRIG 1000ML POUR BTL (IV SOLUTION) ×2 IMPLANT
PAD ARMBOARD 7.5X6 YLW CONV (MISCELLANEOUS) ×2 IMPLANT
POUCH SPECIMEN RETRIEVAL 10MM (ENDOMECHANICALS) ×2 IMPLANT
SCISSORS LAP 5X35 DISP (ENDOMECHANICALS) ×2 IMPLANT
SET CHOLANGIOGRAPH 5 50 .035 (SET/KITS/TRAYS/PACK) ×2 IMPLANT
SET IRRIG TUBING LAPAROSCOPIC (IRRIGATION / IRRIGATOR) ×2 IMPLANT
SLEEVE ENDOPATH XCEL 5M (ENDOMECHANICALS) ×2 IMPLANT
SPECIMEN JAR SMALL (MISCELLANEOUS) ×2 IMPLANT
SPONGE GAUZE 2X2 STER 10/PKG (GAUZE/BANDAGES/DRESSINGS) ×1
SUT MNCRL AB 4-0 PS2 18 (SUTURE) ×2 IMPLANT
TOWEL OR 17X24 6PK STRL BLUE (TOWEL DISPOSABLE) ×2 IMPLANT
TOWEL OR 17X26 10 PK STRL BLUE (TOWEL DISPOSABLE) ×2 IMPLANT
TRAY LAPAROSCOPIC (CUSTOM PROCEDURE TRAY) ×2 IMPLANT
TROCAR XCEL BLUNT TIP 100MML (ENDOMECHANICALS) ×2 IMPLANT
TROCAR XCEL NON-BLD 11X100MML (ENDOMECHANICALS) ×2 IMPLANT
TROCAR XCEL NON-BLD 5MMX100MML (ENDOMECHANICALS) ×2 IMPLANT
TUBING INSUFFLATION (TUBING) ×2 IMPLANT

## 2014-05-08 NOTE — Progress Notes (Signed)
OT Cancellation Note  Patient Details Name: Destiny Moore MRN: [id number] DOB: 20-May-1932   Cancelled Treatment:    Reason Eval/Treat Not Completed: Patient at procedure or test/ unavailable. Pt in OR today. OT will follow up with as available following procedure.   Nena JordanMiller, Akita Maxim M 05/08/2014, 9:29 AM   Carney LivingLeeAnn Marie Jere Bostrom, OTR/L Occupational Therapist [phone] (pager)

## 2014-05-08 NOTE — Anesthesia Preprocedure Evaluation (Addendum)
Anesthesia Evaluation  Patient identified by MRN, date of birth, ID band Patient awake    Reviewed: Allergy & Precautions, NPO status , Patient's Chart, lab work & pertinent test results  Airway Mallampati: II  TM Distance: >3 FB Neck ROM: Full    Dental  (+) Edentulous Upper, Edentulous Lower   Pulmonary neg pulmonary ROS,          Cardiovascular hypertension, Pt. on medications     Neuro/Psych Anxiety Depression negative neurological ROS     GI/Hepatic Neg liver ROS, GERD-  ,  Endo/Other  negative endocrine ROS  Renal/GU CRFRenal disease     Musculoskeletal  (+) Arthritis -,   Abdominal   Peds  Hematology negative hematology ROS (+)   Anesthesia Other Findings   Reproductive/Obstetrics                            Anesthesia Physical Anesthesia Plan  ASA: II  Anesthesia Plan: General   Post-op Pain Management:    Induction: Intravenous  Airway Management Planned: Oral ETT  Additional Equipment:   Intra-op Plan:   Post-operative Plan: Extubation in OR  Informed Consent: I have reviewed the patients History and Physical, chart, labs and discussed the procedure including the risks, benefits and alternatives for the proposed anesthesia with the patient or authorized representative who has indicated his/her understanding and acceptance.   Dental advisory given  Plan Discussed with: CRNA  Anesthesia Plan Comments:        Anesthesia Quick Evaluation

## 2014-05-08 NOTE — Anesthesia Procedure Notes (Signed)
Procedure Name: Intubation Date/Time: 05/08/2014 9:45 AM Performed by: Rise PatienceBELL, Muneeb Veras T Pre-anesthesia Checklist: Patient identified, Emergency Drugs available, Suction available and Patient being monitored Patient Re-evaluated:Patient Re-evaluated prior to inductionOxygen Delivery Method: Circle system utilized Preoxygenation: Pre-oxygenation with 100% oxygen Intubation Type: IV induction Ventilation: Mask ventilation without difficulty Laryngoscope Size: Miller and 2 Grade View: Grade I Tube type: Oral Tube size: 7.5 mm Number of attempts: 1 Airway Equipment and Method: Stylet Placement Confirmation: ETT inserted through vocal cords under direct vision,  positive ETCO2 and breath sounds checked- equal and bilateral Secured at: 20 cm Tube secured with: Tape Dental Injury: Teeth and Oropharynx as per pre-operative assessment

## 2014-05-08 NOTE — Progress Notes (Signed)
Called Dr.Fitzgerald for sign out  

## 2014-05-08 NOTE — Op Note (Signed)
Laparoscopic Cholecystectomy with IOC Procedure Note  Indications: This patient presents with symptomatic gallbladder disease and will undergo laparoscopic cholecystectomy.  Pre-operative Diagnosis: Calculus of gallbladder with other cholecystitis, without mention of obstruction  Post-operative Diagnosis: Same  Surgeon: Janney Priego K.   Assistants: none  Anesthesia: General endotracheal anesthesia  ASA Class: 2  Procedure Details  The patient was seen again in the Holding Room. The risks, benefits, complications, treatment options, and expected outcomes were discussed with the patient. The possibilities of reaction to medication, pulmonary aspiration, perforation of viscus, bleeding, recurrent infection, finding a normal gallbladder, the need for additional procedures, failure to diagnose a condition, the possible need to convert to an open procedure, and creating a complication requiring transfusion or operation were discussed with the patient. The likelihood of improving the patient's symptoms with return to their baseline status is good.  The patient and/or family concurred with the proposed plan, giving informed consent. The site of surgery properly noted. The patient was taken to Operating Room, identified as Destiny Moore and the procedure verified as Laparoscopic Cholecystectomy with Intraoperative Cholangiogram. A Time Out was held and the above information confirmed.  Prior to the induction of general anesthesia, antibiotic prophylaxis was administered. General endotracheal anesthesia was then administered and tolerated well. After the induction, the abdomen was prepped with Chloraprep and draped in the sterile fashion. The patient was positioned in the supine position.  Local anesthetic agent was injected into the skin near the umbilicus and an incision made. We dissected down to the abdominal fascia with blunt dissection.  The fascia was incised vertically and we entered the  peritoneal cavity bluntly.  A pursestring suture of 0-Vicryl was placed around the fascial opening.  The Hasson cannula was inserted and secured with the stay suture.  Pneumoperitoneum was then created with CO2 and tolerated well without any adverse changes in the patient's vital signs. An 11-mm port was placed in the subxiphoid position.  Two 5-mm ports were placed in the right upper quadrant. All skin incisions were infiltrated with a local anesthetic agent before making the incision and placing the trocars.   We positioned the patient in reverse Trendelenburg, tilted slightly to the patient's left.  The gallbladder was identified and was noted to be enlarged and distended.  We had to decompress the gallbladder with the suction irrigator in order to grasp the fundus.  She was noted to have hydrops.  The fundus was grasped and retracted cephalad. Adhesions were lysed bluntly and with the electrocautery where indicated, taking care not to injure any adjacent organs or viscus. The infundibulum was grasped and retracted laterally, exposing the peritoneum overlying the triangle of Calot. This was then divided and exposed in a blunt fashion. A critical view of the cystic duct and cystic artery was obtained.  The cystic duct was clearly identified and bluntly dissected circumferentially. The cystic duct was ligated with a clip distally.   An incision was made in the cystic duct and the [facility] cholangiogram catheter introduced. The catheter was secured using a clip. A cholangiogram was then obtained which showed good visualization of the distal and proximal biliary tree with no sign of filling defects or obstruction.  Contrast flowed easily into the duodenum. The common bile duct is quite dilated, but this was seen on preoperative studies. The catheter was then removed.   The cystic duct was then ligated with clips and divided. The cystic artery was identified, dissected free, ligated with clips and divided as well.  The gallbladder was dissected from the liver bed in retrograde fashion with the electrocautery. The gallbladder was removed and placed in an Endocatch sac. The liver bed was irrigated and inspected. Hemostasis was achieved with the electrocautery. Copious irrigation was utilized and was repeatedly aspirated until clear.  The gallbladder and Endocatch sac were then removed through the umbilical port site.  The pursestring suture was used to close the umbilical fascia.    We again inspected the right upper quadrant for hemostasis.  Pneumoperitoneum was released as we removed the trocars.  4-0 Monocryl was used to close the skin.   Benzoin, steri-strips, and clean dressings were applied. The patient was then extubated and brought to the recovery room in stable condition. Instrument, sponge, and needle counts were correct at closure and at the conclusion of the case.   Findings: Cholecystitis with Cholelithiasis  Estimated Blood Loss: Minimal         Drains: none         Specimens: Gallbladder           Complications: None; patient tolerated the procedure well.         Disposition: PACU - hemodynamically stable.         Condition: stable   Wilmon ArmsMatthew K. Corliss Skainssuei, MD, [facility]  General/ Trauma Surgery  05/08/2014 10:45 AM

## 2014-05-08 NOTE — Progress Notes (Signed)
Report given to elise rn as caregiver 

## 2014-05-08 NOTE — Transfer of Care (Signed)
Immediate Anesthesia Transfer of Care Note  Patient: Destiny Moore  Procedure(s) Performed: Procedure(s): LAPAROSCOPIC CHOLECYSTECTOMY WITH INTRAOPERATIVE CHOLANGIOGRAM (N/A)  Patient Location: PACU  Anesthesia Type:General  Level of Consciousness: awake and alert   Airway & Oxygen Therapy: Patient Spontanous Breathing and Patient connected to nasal cannula oxygen  Post-op Assessment: Report given to PACU RN, Post -op Vital signs reviewed and stable and Patient moving all extremities X 4  Post vital signs: Reviewed and stable  Complications: No apparent anesthesia complications

## 2014-05-08 NOTE — Progress Notes (Signed)
Returned to room 5w07 from PACU. A/Ox4, gait steady-ambulated to BR, husband at bedside, denies nausea/pain at this time. 4 lap site dsg CDI

## 2014-05-08 NOTE — Anesthesia Postprocedure Evaluation (Signed)
  Anesthesia Post-op Note  Patient: Destiny Moore  Procedure(s) Performed: Procedure(s): LAPAROSCOPIC CHOLECYSTECTOMY WITH INTRAOPERATIVE CHOLANGIOGRAM (N/A)  Patient Location: PACU  Anesthesia Type:General  Level of Consciousness: awake and alert   Airway and Oxygen Therapy: Patient Spontanous Breathing  Post-op Pain: none  Post-op Assessment: Post-op Vital signs reviewed  Post-op Vital Signs: Reviewed  Last Vitals:  Filed Vitals:   05/08/14 1227  BP: 137/60  Pulse: 60  Temp: 36.3 C  Resp: 18    Complications: No apparent anesthesia complications

## 2014-05-08 NOTE — Progress Notes (Signed)
Family Medicine Teaching Service Daily Progress Note Intern Pager: [phone]  Patient name: Destiny Moore Medical record number: [id number] Date of birth: [birth year] Age: 81 y.o. Gender: female  Primary Care Provider: Saralyn Pilar, DO Consultants: Gastroenterology Code Status: Full  Assessment and Plan: 81 y.o. female presenting with RUQ pain concerning for common bile duct stone without acute cystitis. PMH is significant for GERD, HTN, Depression, CKD stage III, PTSD, Grade I diastolic CHF.  # RUQ Pain- Concern for Choledocholithiasis. Cholelithiasis noted on imaging. - [place]: significant dilatation of the common hepatic duct to 1.3cm concerning for distal obstruction. Multiple large stones in gallbladder. Gallbladder distended without evidence of cholecystitis. Mild intrahepatic biliary ductal dilatation. Negative murphy's - Diet advanced to Heart Healthy diet today. Fluids discontinued. - Acetaminophen PRN pain - GI consulted. Appreciate recommendations.   - MRCP- cholelithiasis without inflammatory changes, stable dilatation of common duct measuring 13mm. No choledocholithiasis.  - Recommend General Surgery Consult - General Surgery consulted. Appreciate recommendations.  - Laparoscopic Cholecystectomy today - PT and OT consult- no outpatient followup recommended  FEN/GI: Heart Healthy Prophylaxis: Subcutaneous Heparin  Disposition: Admitted to [facility] Medicine Inpatient Service. Discharge pending General Surgery consult.  Subjective:  No acute complaints overnight. States surgery went well. Complains of diffuse abdominal pain consistent with surgery. States she has already been up and ambulating to restroom. Has already had bowel movement post surgery. No other concerns at this time.  Objective: Temp:  [98 F (36.7 C)-98.4 F (36.9 C)] 98 F (36.7 C) (01/07 0822) Pulse Rate:  [76-81] 79 (01/07 0822) Resp:  [15-18] 18 (01/07 0822) BP: (119-161)/(57-72) 161/72 mmHg  (01/07 0822) SpO2:  [95 %-100 %] 100 % (01/07 6578) Physical Exam: General: 81yo female resting comfortably in no apparent distress Cardiovascular: S1 and S2 noted, no murmurs/rubs/gallops, regular rate and rhythm Respiratory: Clear to auscultation bilaterally. No wheezes/rales/rhonchi. No increased work of breathing. Abdomen: Bowel sounds noted. Soft and nondistended. Diffuse tenderness. Bandages dry and intact. Extremities: No edema noted.  Laboratory:  Recent Labs Lab 05/05/14 1446  WBC 6.4  HGB 12.8  HCT 38.3  PLT 243    Recent Labs Lab 05/05/14 1446 05/06/14 0255 05/07/14 0554  NA 131* 138 142  K 3.6 3.5 3.4*  CL 98 106 113*  CO2 BUN 24* 19 11  CREATININE 2.07* 1.82* 1.38*  CALCIUM 8.6 8.1* 8.2*  PROT 7.9 5.9* 5.8*  BILITOT 0.6 0.7 0.6  ALKPHOS 66 52 48  ALT AST GLUCOSE 125* 84 84   Urinalysis    Component Value Date/Time   COLORURINE YELLOW 05/05/2014 1754   APPEARANCEUR HAZY* 05/05/2014 1754   LABSPEC 1.009 05/05/2014 1754   PHURINE 5.5 05/05/2014 1754   GLUCOSEU NEGATIVE 05/05/2014 1754   HGBUR NEGATIVE 05/05/2014 1754   HGBUR negative 05/27/2010 1047   BILIRUBINUR NEGATIVE 05/05/2014 1754   BILIRUBINUR neg 09/21/2011 1218   KETONESUR NEGATIVE 05/05/2014 1754   PROTEINUR NEGATIVE 05/05/2014 1754   PROTEINUR neg 09/21/2011 1218   UROBILINOGEN 0.2 05/05/2014 1754   UROBILINOGEN 1.0 09/21/2011 1218   NITRITE NEGATIVE 05/05/2014 1754   NITRITE neg 09/21/2011 1218   LEUKOCYTESUR LARGE* 05/05/2014 1754   Imaging/Diagnostic Tests: Mr Abdomen Mrcp Wo Cm  05/07/2014   CLINICAL DATA:  Right upper quadrant/epigastric abdominal pain, CBD dilatation on ultrasound  EXAM: MRI ABDOMEN WITHOUT CONTRAST  (INCLUDING MRCP)  TECHNIQUE: Multiplanar multisequence MR imaging of the abdomen was performed. Heavily T2-weighted images of the biliary  and pancreatic ducts were obtained, and three-dimensional MRCP images were rendered by post  processing.  COMPARISON:  Right upper quadrant ultrasound dated 05/05/2014. CT abdomen pelvis dated 02/27/2014.  FINDINGS: Lower chest:  Lung bases are clear.  Hepatobiliary: 11 mm T2 hyperintense lesion in the posterior segment right hepatic lobe, incompletely characterized but unchanged from prior CT. No hepatic steatosis.  Distended gallbladder with layering gallstones. No associated inflammatory changes.  No intrahepatic ductal dilatation. Central common duct is dilated, measuring 13 mm, but unchanged from prior CT and smoothly tapering at the ampulla. No choledocholithiasis.  Pancreas: Within normal limits.  No pancreatic ductal dilatation.  Spleen: Within normal limits.  Adrenals/Urinary Tract: Adrenal glands are unremarkable.  Bilateral renal cysts.  No hydronephrosis.  Stomach/Bowel: Stomach and visualized bowel are grossly unremarkable.  Vascular/Lymphatic: No evidence of abdominal aortic aneurysm.  No suspicious abdominal lymphadenopathy.  Other: No abdominal ascites.  Musculoskeletal: No focal osseous lesions.  IMPRESSION: Cholelithiasis, without associated inflammatory changes.  Stable dilatation the common duct, measuring 13 mm. No choledocholithiasis.   Electronically Signed   By: Charline BillsSriyesh  Krishnan M.D.   On: 05/07/2014 09:38   No results found. [address] ArcadiaRumley, DO 05/08/2014, 8:38 AM PGY-1, [facility] FPTS Intern pager: [phone], text pages welcome

## 2014-05-08 NOTE — Care Management Note (Signed)
    Page 1 of 1   05/09/2014     3:04:32 PM CARE MANAGEMENT NOTE 05/09/2014  Patient:  Hardie LoraHARRIS,Loie L   Account Number:  [id number]  Date Initiated:  05/08/2014  Documentation initiated by:  Letha CapeAYLOR,Aariel Ems  Subjective/Objective Assessment:   dx biliary stone     Action/Plan:   Anticipated DC Date:  05/09/2014   Anticipated DC Plan:  HOME/SELF CARE      DC Planning Services  CM consult      Choice offered to / List presented to:             Status of service:   Medicare Important Message given?  YES (If response is "NO", the following Medicare IM given date fields will be blank) Date Medicare IM given:  05/07/2014 Medicare IM given by:  Letha CapeAYLOR,Ketrick Matney Date Additional Medicare IM given:   Additional Medicare IM given by:    Discharge Disposition:  HOME/SELF CARE  Per UR Regulation:  Reviewed for med. necessity/level of care/duration of stay  If discussed at Long Length of Stay Meetings, dates discussed:    Comments:  05/09/14 1503 Letha Capeeborah Quency Tober RN, BSN [phone]  patient is for dc today no needs anticipated.

## 2014-05-09 ENCOUNTER — Encounter (HOSPITAL_COMMUNITY): Payer: Self-pay | Admitting: Surgery

## 2014-05-09 DIAGNOSIS — R1010 Upper abdominal pain, unspecified: Secondary | ICD-10-CM

## 2014-05-09 LAB — CBC
HCT: 32.8 % — ABNORMAL LOW (ref 36.0–46.0)
Hemoglobin: 11.2 g/dL — ABNORMAL LOW (ref 12.0–15.0)
MCH: 30.8 pg (ref 26.0–34.0)
MCHC: 34.1 g/dL (ref 30.0–36.0)
MCV: 90.1 fL (ref 78.0–100.0)
Platelets: 209 10*3/uL (ref 150–400)
RBC: 3.64 MIL/uL — AB (ref 3.87–5.11)
RDW: 13.4 % (ref 11.5–15.5)
WBC: 7.4 10*3/uL (ref 4.0–10.5)

## 2014-05-09 MED ORDER — HYDROCODONE-ACETAMINOPHEN 5-325 MG PO TABS: 1.0000 | ORAL_TABLET | Freq: Four times a day (QID) | ORAL | Status: DC | PRN

## 2014-05-09 NOTE — Progress Notes (Signed)
Family Medicine Teaching Service Daily Progress Note Intern Pager: [phone]  Patient name: Destiny Moore Medical record number: [id number] Date of birth: [birth year] Age: 81 y.o. Gender: female  Primary Care Provider: Saralyn PilarKaramalegos, Alexander, DO Consultants: Gastroenterology Code Status: Full  Assessment and Plan: 81 y.o. female presenting with RUQ pain concerning for common bile duct stone without acute cystitis. PMH is significant for GERD, HTN, Depression, CKD stage III, PTSD, Grade I diastolic CHF.  # POD#1 s/p Laparoscopic Cholecystectomy-  - Hemoglobin 11.2 this morning, down from 13.9 yesterday due to surgery - US: significant dilatation of the common hepatic duct to 1.3cm concerning for distal obstruction. Multiple large stones in gallbladder. Gallbladder distended without evidence of cholecystitis. Mild intrahepatic biliary ductal dilatation. Negative murphy's - Acetaminophen PRN pain, Norco 1-2tablets q4hr PRN--Spacing Norco to 1tablet q6hr - GI consulted. Appreciate recommendations.   - MRCP- cholelithiasis without inflammatory changes, stable dilatation of common duct measuring 13mm. No choledocholithiasis.  - Recommend General Surgery Consult - General Surgery consulted. Appreciate recommendations.  - Laparoscopic Cholecystectomy on 1/7 - PT and OT consult- no outpatient followup recommended  FEN/GI: Heart Healthy Prophylaxis: Subcutaneous Heparin  [facility].  Subjective:  No acute complaints overnight. Admits to abdominal pain around incision sites. States she has been able to ambulate and has had a bowel movement since surgery yesterday. No further complaints today. Excited about discharge and going home.  Objective: Temp:  [97.2 F (36.2 C)-99.5 F (37.5 C)] 99.1 F (37.3 C) (01/08 0538) Pulse Rate:  [58-81] 77 (01/08 0538) Resp:  [12-21] 12 (01/08 0538) BP: (127-139)/(55-81) 133/62 mmHg (01/08  0538) SpO2:  [89 %-100 %] 93 % (01/08 0538) Physical Exam: General: 81yo female resting comfortably in no apparent distress Cardiovascular: S1 and S2 noted, No murmurs/rubs/gallops, Regular rate and rhythm Respiratory: Clear to auscultation bilaterally. No wheezes/rales/rhonchi. No increased work of breathing. Abdomen: Bowel sounds noted. Soft and nondistended. Diffuse tenderness. Bandages dry and intact. Extremities: No edema noted.  Laboratory:  Recent Labs Lab 05/05/14 1446 05/08/14 0831 05/09/14 0510  WBC 6.4 5.0 7.4  HGB 12.8 13.9 11.2*  HCT 38.3 41.2 32.8*  PLT 243 238 209    Recent Labs Lab 05/05/14 1446 05/06/14 0255 05/07/14 0554 05/08/14 0831  NA 131* 138 142 142  K 3.6 3.5 3.4* 4.3  CL 98 106 113* 111  CO2 22 26 20 21   BUN 24* 19 11 12   CREATININE 2.07* 1.82* 1.38* 1.37*  CALCIUM 8.6 8.1* 8.2* 9.7  PROT 7.9 5.9* 5.8*  --   BILITOT 0.6 0.7 0.6  --   ALKPHOS 66 52 48  --   ALT 21 16 16   --   AST 30 24 23   --   GLUCOSE 125* 84 84 100*   Urinalysis    Component Value Date/Time   COLORURINE YELLOW 05/05/2014 1754   APPEARANCEUR HAZY* 05/05/2014 1754   LABSPEC 1.009 05/05/2014 1754   PHURINE 5.5 05/05/2014 1754   GLUCOSEU NEGATIVE 05/05/2014 1754   HGBUR NEGATIVE 05/05/2014 1754   HGBUR negative 05/27/2010 1047   BILIRUBINUR NEGATIVE 05/05/2014 1754   BILIRUBINUR neg 09/21/2011 1218   KETONESUR NEGATIVE 05/05/2014 1754   PROTEINUR NEGATIVE 05/05/2014 1754   PROTEINUR neg 09/21/2011 1218   UROBILINOGEN 0.2 05/05/2014 1754   UROBILINOGEN 1.0 09/21/2011 1218   NITRITE NEGATIVE 05/05/2014 1754   NITRITE neg 09/21/2011 1218   LEUKOCYTESUR LARGE* 05/05/2014 1754   Imaging/Diagnostic Tests: Dg Cholangiogram Operative  05/08/2014   CLINICAL  DATA:  Laparoscopic cholecystectomy.  EXAM: INTRAOPERATIVE CHOLANGIOGRAM  FLUOROSCOPY TIME:  16 seconds  COMPARISON:  MRCP - 05/06/2014  FINDINGS: Intraoperative angiographic images of the right upper abdominal quadrant  during laparoscopic cholecystectomy are provided for review.  Surgical clips overlie the expected location of the gallbladder fossa.  Contrast injection demonstrates selective cannulation of the central aspect of the cystic duct.  There is passage of contrast through the central aspect of the cystic duct with filling of a mildly dilated common bile duct. There is passage of contrast though the CBD and into the descending portion of the duodenum.  There is minimal reflux of injected contrast into the common hepatic duct and central aspect of the non dilated intrahepatic biliary system.  There are no discrete filling defects within the opacified portions of the biliary system to suggest the presence of choledocholithiasis.  IMPRESSION: No evidence of choledocholithiasis.   Electronically Signed   By: Simonne Come M.D.   On: 05/08/2014 11:00   Dg Cholangiogram Operative  05/08/2014   CLINICAL DATA:  Laparoscopic cholecystectomy.  EXAM: INTRAOPERATIVE CHOLANGIOGRAM  FLUOROSCOPY TIME:  16 seconds  COMPARISON:  MRCP - 05/06/2014  FINDINGS: Intraoperative angiographic images of the right upper abdominal quadrant during laparoscopic cholecystectomy are provided for review.  Surgical clips overlie the expected location of the gallbladder fossa.  Contrast injection demonstrates selective cannulation of the central aspect of the cystic duct.  There is passage of contrast through the central aspect of the cystic duct with filling of a mildly dilated common bile duct. There is passage of contrast though the CBD and into the descending portion of the duodenum.  There is minimal reflux of injected contrast into the common hepatic duct and central aspect of the non dilated intrahepatic biliary system.  There are no discrete filling defects within the opacified portions of the biliary system to suggest the presence of choledocholithiasis.  IMPRESSION: No evidence of choledocholithiasis.   Electronically Signed   By: Simonne Come  M.D.   On: 05/08/2014 11:00   Araceli Bouche, DO 05/09/2014, 8:26 AM PGY-1, [facility] FPTS Intern pager: [phone], text pages welcome

## 2014-05-09 NOTE — Progress Notes (Signed)
Patient ID: Destiny Moore, female   DOB: 11-17-1932, 81 y.o.   MRN: [id number] 1 Day Post-Op  Subjective: Pt doing well.  Pain is well controlled.  Ate a solid diet for breakfast.  Wants to go home.  Objective: Vital signs in last 24 hours: Temp:  [97.2 F (36.2 C)-99.5 F (37.5 C)] 99.1 F (37.3 C) (01/08 0538) Pulse Rate:  [58-81] 77 (01/08 0538) Resp:  [12-21] 12 (01/08 0538) BP: (127-139)/(55-81) 133/62 mmHg (01/08 0538) SpO2:  [89 %-100 %] 93 % (01/08 0538) Last BM Date: 05/07/14  Intake/Output from previous day: 01/07 0701 - 01/08 0700 In: 2750.8 [P.O.:840; I.V.:1910.8] Out: 1950 [Urine:1950] Intake/Output this shift: Total I/O In: 240 [P.O.:240] Out: 300 [Urine:300]  PE: Abd: soft, appropriately tender, +BS, ND, incisions c/d/i with tegaderm and gauze present  Lab Results:   Recent Labs  05/08/14 0831 05/09/14 0510  WBC 5.0 7.4  HGB 13.9 11.2*  HCT 41.2 32.8*  PLT 238 209   BMET  Recent Labs  05/07/14 0554 05/08/14 0831  NA 142 142  K 3.4* 4.3  CL 113* 111  CO2 20 21  GLUCOSE 84 100*  BUN 11 12  CREATININE 1.38* 1.37*  CALCIUM 8.2* 9.7   PT/INR No results for input(s): LABPROT, INR in the last 72 hours. CMP     Component Value Date/Time   NA 142 05/08/2014 0831   K 4.3 05/08/2014 0831   CL 111 05/08/2014 0831   CO2 21 05/08/2014 0831   GLUCOSE 100* 05/08/2014 0831   BUN 12 05/08/2014 0831   CREATININE 1.37* 05/08/2014 0831   CREATININE 1.32* 08/21/2013 1640   CALCIUM 9.7 05/08/2014 0831   PROT 5.8* 05/07/2014 0554   ALBUMIN 3.1* 05/07/2014 0554   AST 23 05/07/2014 0554   ALT 16 05/07/2014 0554   ALKPHOS 48 05/07/2014 0554   BILITOT 0.6 05/07/2014 0554   GFRNONAA 36* 05/08/2014 0831   GFRAA 41* 05/08/2014 0831   Lipase     Component Value Date/Time   LIPASE 28 05/05/2014 1446       Studies/Results: Dg Cholangiogram Operative  05/08/2014   CLINICAL DATA:  Laparoscopic cholecystectomy.  EXAM: INTRAOPERATIVE CHOLANGIOGRAM   FLUOROSCOPY TIME:  16 seconds  COMPARISON:  MRCP - 05/06/2014  FINDINGS: Intraoperative angiographic images of the right upper abdominal quadrant during laparoscopic cholecystectomy are provided for review.  Surgical clips overlie the expected location of the gallbladder fossa.  Contrast injection demonstrates selective cannulation of the central aspect of the cystic duct.  There is passage of contrast through the central aspect of the cystic duct with filling of a mildly dilated common bile duct. There is passage of contrast though the CBD and into the descending portion of the duodenum.  There is minimal reflux of injected contrast into the common hepatic duct and central aspect of the non dilated intrahepatic biliary system.  There are no discrete filling defects within the opacified portions of the biliary system to suggest the presence of choledocholithiasis.  IMPRESSION: No evidence of choledocholithiasis.   Electronically Signed   By: Simonne Come M.D.   On: 05/08/2014 11:00    Anti-infectives: Anti-infectives    Start     Dose/Rate Route Frequency Ordered Stop   05/07/14 1800  ceFAZolin (ANCEF) IVPB 2 g/50 mL premix     2 g100 mL/hr over 30 Minutes Intravenous  Once 05/07/14 1718 05/07/14 1850       Assessment/Plan  1. POD 1, s/p lap chole  Plan: 1. Patient is  surgically stable for dc home.  I will arrange her follow up with us as an outpatient.   LOS: 4 days    Isai Gottlieb E 05/09/2014, 10:59 AM Pager: [phone]

## 2014-05-09 NOTE — Progress Notes (Signed)
Occupational Therapy Treatment Patient Details Name: Destiny Moore MRN: [id number] DOB: 05-18-1932 Today's Date: 05/09/2014    History of present illness Pt is a 81 year old female presenting to hospital from PCP with RUQ pain accompanied by nausea. Pt had been told she had gallstones. Pt now s/p laparoscopic cholecystectomy on 05/08/13.    OT comments  Pt seen today for ADL session following surgery yesterday. Pt reports feeling well and overall requires Supervision for ADLs for safety and line management. Education and training completed with pt and husband for energy conservation and safety with ADLs at home. Pt hopes to d/c home today and feel that pt is safe from OT standpoint.   Follow Up Recommendations  No OT follow up;Supervision - Intermittent    Equipment Recommendations    None recommended   Recommendations for Other Services      Precautions / Restrictions Precautions Precautions: Fall Restrictions Weight Bearing Restrictions: No       Mobility Bed Mobility Overal bed mobility: Modified Independent                Transfers Overall transfer level: Modified independent               General transfer comment: Mod I for transfers.         ADL Overall ADL's : Needs assistance/impaired     Grooming: Wash/dry hands;Wash/dry face;Oral care;Supervision/safety;Standing Grooming Details (indicate cue type and reason): pt stood at sink x10 minutes for grooming and UB bathing with no LOB Upper Body Bathing: Supervision/ safety;Standing   Lower Body Bathing: Supervison/ safety;Sit to/from stand Lower Body Bathing Details (indicate cue type and reason): pt sat on side of bed for LB bathing. Supervision for sit<>stand. Pt able to gather materials needed for bathing         Toilet Transfer: Supervision/safety;Ambulation;Comfort height toilet   Toileting- Clothing Manipulation and Hygiene: Supervision/safety;Sit to/from stand       Functional mobility  during ADLs: Supervision/safety (in room only) General ADL Comments: Pt overall requires Supervision for safety for ADLs. Pt ambulated around room with Supervision for safety and line management. Pt stood at sink x10 minutes for grooming and UB bathing. Husband present for training and education. Recommended that pt use shower seat initially once home for safety due to deconditioning from hospitalization and pt agreed.                 Cognition  Arousal/Alertness: Awake/Alert Behavior During Therapy: WFL for tasks assessed/performed Overall Cognitive Status: Within Functional Limits for tasks assessed                                    Pertinent Vitals/ Pain       Pain Assessment: No/denies pain         Frequency Min 2X/week     Progress Toward Goals  OT Goals(current goals can now be found in the care plan section)  Progress towards OT goals: Progressing toward goals  Acute Rehab OT Goals Patient Stated Goal: home today OT Goal Formulation: With patient Time For Goal Achievement: 05/13/14 Potential to Achieve Goals: Good  Plan Discharge plan remains appropriate       End of Session Equipment Utilized During Treatment: Gait belt   Activity Tolerance Patient tolerated treatment well   Patient Left in chair;with call bell/phone within reach;with family/visitor present   Nurse Communication  Time: 1100-1130 OT Time Calculation (min): 30 min  Charges: OT General Charges $OT Visit: 1 Procedure OT Treatments $Self Care/Home Management : 23-37 mins  Rae Lips 05/09/2014, 12:19 PM   Yehuda Mao Guido Sander, OTR/L Occupational Therapist [phone] (pager)

## 2014-05-09 NOTE — Discharge Instructions (Signed)
[facility] ______[facility] °LAPAROSCOPIC SURGERY: POST OP INSTRUCTIONS °Always review your discharge instruction sheet given to you by the facility where your surgery was performed. °IF YOU HAVE DISABILITY OR FAMILY LEAVE FORMS, YOU MUST BRING THEM TO THE OFFICE FOR PROCESSING.   °DO NOT GIVE THEM TO YOUR DOCTOR. ° °1. A prescription for pain medication may be given to you upon discharge.  Take your pain medication as prescribed, if needed.  If narcotic pain medicine is not needed, then you may take acetaminophen (Tylenol) or ibuprofen (Advil) as needed. °2. Take your usually prescribed medications unless otherwise directed. °3. If you need a refill on your pain medication, please contact your pharmacy.  They will contact our office to request authorization. Prescriptions will not be filled after 5pm or on week-ends. °4. You should follow a light diet the first few days after arrival home, such as soup and crackers, etc.  Be sure to include lots of fluids daily. °5. Most patients will experience some swelling and bruising in the area of the incisions.  Ice packs will help.  Swelling and bruising can take several days to resolve.  °6. It is common to experience some constipation if taking pain medication after surgery.  Increasing fluid intake and taking a stool softener (such as Colace) will usually help or prevent this problem from occurring.  A mild laxative (Milk of Magnesia or Miralax) should be taken according to package instructions if there are no bowel movements after 48 hours. °7. Unless discharge instructions indicate otherwise, you may remove your bandages 24-48 hours after surgery, and you may shower at that time.  You may have steri-strips (small skin tapes) in place directly over the incision.  These strips should be left on the skin for 7-10 days.  If your surgeon used skin glue on the incision, you may shower in 24 hours.  The glue will flake off over the next 2-3 weeks.  Any sutures or  staples will be removed at the office during your follow-up visit. °8. ACTIVITIES:  You may resume regular (light) daily activities beginning the next day--such as daily self-care, walking, climbing stairs--gradually increasing activities as tolerated.  You may have sexual intercourse when it is comfortable.  Refrain from any heavy lifting or straining until approved by your doctor. °a. You may drive when you are no longer taking prescription pain medication, you can comfortably wear a seatbelt, and you can safely maneuver your car and apply brakes. °b. RETURN TO WORK:  __________________________________________________________ °9. You should see your doctor in the office for a follow-up appointment approximately 2-3 weeks after your surgery.  Make sure that you call for this appointment within a day or two after you arrive home to insure a convenient appointment time. °10. OTHER INSTRUCTIONS: __________________________________________________________________________________________________________________________ __________________________________________________________________________________________________________________________ °WHEN TO CALL YOUR DOCTOR: °1. Fever over 101.0 °2. Inability to urinate °3. Continued bleeding from incision. °4. Increased pain, redness, or drainage from the incision. °5. Increasing abdominal pain ° °The clinic staff is available to answer your questions during regular business hours.  Please don’t hesitate to call and ask to speak to one of the nurses for clinical concerns.  If you have a medical emergency, go to the nearest emergency room or call 911.  A surgeon from [facility] is always on call at the hospital. °[address], [address], [place], [place]  [address] ? [address] [address], [place], [place]   [address] °[phone] ? [phone] ? FAX [phone] °Web site:   [email or web address] °

## 2014-05-09 NOTE — Progress Notes (Signed)
Pt DC home with family. Verbalized understanding of DC instructions. Out via wheelchair with staff. No pain med script found, and pt states she does not need one.

## 2014-05-11 NOTE — Discharge Summary (Signed)
Family Medicine Teaching [facility] Discharge Summary  Patient name: Destiny Moore Medical record number: [id number] Date of birth: 1932/11/11 Age: 81 y.o. Gender: female Date of Admission: 05/05/2014  Date of Discharge: 05/09/14 Admitting Physician: Tobey Grim, MD  Primary Care Provider: Saralyn Pilar, DO Consultants: Gastroenterology, General Surgery  Indication for Hospitalization: Cholelithiasis  Discharge Diagnoses/Problem List:  Cholelithiasis S/P Laparoscopic Cholecystectomy Hyponatremia GERD HTN CKD Stage III Grade I dCHF Neuropathy  Disposition: Discharge Home  Discharge Condition: Stable  Discharge Exam: Home  Brief Hospital Course:  Destiny Moore presented to the [facility] ED on 05/05/2014 with RUQ pain concerning for choledocholithiasis without acute cystitis. US showed significant dilatation of the common hepatic duct to 13mm concerning for distal obstruction but no stone was observed; multiple large stones were noted in gallbladder, which was distended without evidence for cholecystitis. AST was 30 and ALT 21 at admission. Total bilirubin 0.6 at admission.  GI consulted. MRCP on 05/06/14 showed cholelithiasis without inflammatory changes, stable dilatation of the common duct measuring 13mm, but no choledocholithiasis. Recommended General Surgery consult.  General Surgery consulted. Recommended laparoscopic cholecystectomy and she was taken for surgery on 1/7. Discharged on 05/09/14 following improvement of pain after surgery and ability to ambulate.  Issues for Follow Up:  - Follow up with General Surgery as outpatient - Continued improvement of pain and ADLs post surgery  Significant Procedures: MRCP, Laparoscopic Cholecystecomy  Significant Labs and Imaging:   Recent Labs Lab 05/05/14 1446 05/08/14 0831 05/09/14 0510  WBC 6.4 5.0 7.4  HGB 12.8 13.9 11.2*  HCT 38.3 41.2 32.8*  PLT 243 238 209    Recent Labs Lab 05/05/14 1446  05/06/14 0255 05/07/14 0554 05/08/14 0831  NA 131* 138 142 142  K 3.6 3.5 3.4* 4.3  CL 98 106 113* 111  CO2 GLUCOSE 125* 84 84 100*  BUN 24* CREATININE 2.07* 1.82* 1.38* 1.37*  CALCIUM 8.6 8.1* 8.2* 9.7  ALKPHOS 66 52 48  --   AST --   ALT --   ALBUMIN 4.3 3.3* 3.1*  --    Dg Cholangiogram Operative  05/08/2014   CLINICAL DATA:  Laparoscopic cholecystectomy.  EXAM: INTRAOPERATIVE CHOLANGIOGRAM  FLUOROSCOPY TIME:  16 seconds  COMPARISON:  MRCP - 05/06/2014  FINDINGS: Intraoperative angiographic images of the right upper abdominal quadrant during laparoscopic cholecystectomy are provided for review.  Surgical clips overlie the expected location of the gallbladder fossa.  Contrast injection demonstrates selective cannulation of the central aspect of the cystic duct.  There is passage of contrast through the central aspect of the cystic duct with filling of a mildly dilated common bile duct. There is passage of contrast though the CBD and into the descending portion of the duodenum.  There is minimal reflux of injected contrast into the common hepatic duct and central aspect of the non dilated intrahepatic biliary system.  There are no discrete filling defects within the opacified portions of the biliary system to suggest the presence of choledocholithiasis.  IMPRESSION: No evidence of choledocholithiasis.   Electronically Signed   By: Simonne Come M.D.   On: 05/08/2014 11:00   Ct Head Wo Contrast  04/12/2014   CLINICAL DATA:  Near syncope.  Headache.  Hypertension.  EXAM: CT HEAD WITHOUT CONTRAST  TECHNIQUE: Contiguous axial images were obtained from the base of the skull through the vertex without intravenous contrast.  COMPARISON:  06/04/2009  FINDINGS: There is no evidence of intracranial hemorrhage, brain edema, or other signs of acute infarction. There is no evidence of intracranial mass lesion or mass effect. No abnormal extraaxial fluid collections  are identified.  Old lacunar infarct seen involving the right caudate nucleus. Ventricles are stable in size. No skull abnormality identified.  IMPRESSION: No acute intracranial abnormality. 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   Electronically Signed   By: Myles RosenthalJohn  Stahl M.D.   On: 04/12/2014 12:14   Mr Abdomen Mrcp Wo Cm  05/07/2014   CLINICAL DATA:  Right upper quadrant/epigastric abdominal pain, CBD dilatation on ultrasound  EXAM: MRI ABDOMEN WITHOUT CONTRAST  (INCLUDING MRCP)  TECHNIQUE: Multiplanar multisequence MR imaging of the abdomen was performed. Heavily T2-weighted images of the biliary and pancreatic ducts were obtained, and three-dimensional MRCP images were rendered by post processing.  COMPARISON:  Right upper quadrant ultrasound dated 05/05/2014. CT abdomen pelvis dated 02/27/2014.  FINDINGS: Lower chest:  Lung bases are clear.  Hepatobiliary: 11 mm T2 hyperintense lesion in the posterior segment right hepatic lobe, incompletely characterized but unchanged from prior CT. No hepatic steatosis.  Distended gallbladder with layering gallstones. No associated inflammatory changes.  No intrahepatic ductal dilatation. Central common duct is dilated, measuring 13 mm, but unchanged from prior CT and smoothly tapering at the ampulla. No choledocholithiasis.  Pancreas: Within normal limits.  No pancreatic ductal dilatation.  Spleen: Within normal limits.  Adrenals/Urinary Tract: Adrenal glands are unremarkable.  Bilateral renal cysts.  No hydronephrosis.  Stomach/Bowel: Stomach and visualized bowel are grossly unremarkable.  Vascular/Lymphatic: No evidence of abdominal aortic aneurysm.  No suspicious abdominal lymphadenopathy.  Other: No abdominal ascites.  Musculoskeletal: No focal osseous lesions.  IMPRESSION: Cholelithiasis, without associated inflammatory changes.  Stable dilatation the common duct, measuring 13 mm. No choledocholithiasis.   Electronically Signed   By: Charline BillsSriyesh  Krishnan M.D.   On: 05/07/2014  09:38   [place] Abdomen Limited Ruq  05/05/2014   CLINICAL DATA:  Acute onset of generalized abdominal pain. Initial encounter.  EXAM: US ABDOMEN LIMITED - RIGHT UPPER QUADRANT  COMPARISON:  None.  FINDINGS: Gallbladder:  The gallbladder is distended, and contains multiple large mobile stones, measuring up to 1.8 cm. No gallbladder wall thickening or pericholecystic fluid is seen. No ultrasonographic Murphy's sign is elicited.  Common bile duct:  Diameter: 1.3 cm. Significantly dilated, raising concern for distal obstruction.  Liver:  No focal lesion identified. Within normal limits in parenchymal echogenicity. Mild intrahepatic biliary ductal dilatation is suggested.  IMPRESSION: Significant dilatation of the common hepatic duct to 1.3 cm, raising concern for distal obstruction. Multiple large stones seen within the gallbladder; the gallbladder is distended, without evidence of cholecystitis. Mild intrahepatic biliary ductal dilatation also suggested. ERCP or MRCP could be considered for further evaluation.   Electronically Signed   By: Roanna RaiderJeffery  Chang M.D.   On: 05/05/2014 21:24   Results/Tests Pending at Time of Discharge: None  Discharge Medications:    Medication List    TAKE these medications        alendronate 70 MG tablet  Commonly known as:  FOSAMAX  - Take 70 mg by mouth once a week. Take with a full glass of water on an empty stomach.  - On Monday     amLODipine 10 MG tablet  Commonly known as:  NORVASC  Take 10 mg by mouth daily.     aspirin 81 MG tablet  Take 81 mg by mouth daily.     B-complex with vitamin C  tablet  Take 1 tablet by mouth daily.     DUREZOL 0.05 % Emul  Generic drug:  Difluprednate  Place 1 drop into the right eye daily.     EPIPEN 0.3 mg/0.3 mL Devi  Generic drug:  EPINEPHrine  Inject 0.3 mg into the muscle as needed (for allergic reaction).     gabapentin 100 MG capsule  Commonly known as:  NEURONTIN  TAKE 3 CAPSULES BY MOUTH EVERY DAY AT BEDTIME      hydrochlorothiazide 12.5 MG capsule  Commonly known as:  MICROZIDE  Take 12.5 mg by mouth daily.     HYDROcodone-acetaminophen 5-325 MG per tablet  Commonly known as:  NORCO/VICODIN  Take 1 tablet by mouth every 8 (eight) hours as needed for moderate pain.     loperamide 2 MG capsule  Commonly known as:  IMODIUM  Take 1 capsule (2 mg total) by mouth as needed for diarrhea or loose stools.     ondansetron 4 MG tablet  Commonly known as:  ZOFRAN  Take 1 tablet (4 mg total) by mouth every 8 (eight) hours as needed for nausea or vomiting.     pantoprazole 40 MG tablet  Commonly known as:  PROTONIX  Take 40 mg by mouth daily.     ranitidine 150 MG tablet  Commonly known as:  ZANTAC  Take 1 tablet (150 mg total) by mouth 2 (two) times daily.     simvastatin 20 MG tablet  Commonly known as:  ZOCOR  Take 20 mg by mouth daily.        Discharge Instructions: Please refer to Patient Instructions section of EMR for full details.  Patient was counseled important signs and symptoms that should prompt return to medical care, changes in medications, dietary instructions, activity restrictions, and follow up appointments.   Follow-Up Appointments:     Follow-up Information    Follow up with Saralyn Pilar, DO On 05/14/2014.   Specialty:  Osteopathic Medicine   Why:  :00am for Hospital Follow Up   Contact information:   [address] [place] [address] [phone]       Follow up with [facility] [place] On 05/27/2014.   Why:  3:00pm, arrive no later than 2:30pm for paperwork   Contact information:   [address]   [place] [address] [phone]       Araceli Bouche, [place] 05/11/2014, 11:41 AM PGY-1, [facility] Family [facility]

## 2014-05-14 ENCOUNTER — Ambulatory Visit (INDEPENDENT_AMBULATORY_CARE_PROVIDER_SITE_OTHER): Payer: Medicare Other | Admitting: Family Medicine

## 2014-05-14 ENCOUNTER — Encounter: Payer: Self-pay | Admitting: Family Medicine

## 2014-05-14 VITALS — BP 135/84 | HR 81 | Temp 98.5°F | Ht 61.0 in | Wt 144.0 lb

## 2014-05-14 DIAGNOSIS — R413 Other amnesia: Secondary | ICD-10-CM

## 2014-05-14 DIAGNOSIS — Z9889 Other specified postprocedural states: Secondary | ICD-10-CM

## 2014-05-14 DIAGNOSIS — Z9049 Acquired absence of other specified parts of digestive tract: Secondary | ICD-10-CM

## 2014-05-14 MED ORDER — HYDROCODONE-ACETAMINOPHEN 5-325 MG PO TABS: 1.0000 | ORAL_TABLET | Freq: Three times a day (TID) | ORAL | Status: DC | PRN

## 2014-05-14 NOTE — Patient Instructions (Signed)
Dear Destiny Moore, Thank you for coming in to clinic today. It was good to see you!  1. It looks like you are doing much better after the surgery. It seems like your gallbladder was the primary source of your pain and symptoms. 2. You will continue to get better over next few weeks. - Take pain medicine as needed 3. Make sure take care of the surgical sites well. No signs of infection today. Wash normally, allow the ster-stripes to fall off on their own. - Check underneath strips to see if there are sutures. If you find them please call [facility] office to ask if you need to be seen sooner for suture removal or if this will happen at your next appointment.  Please schedule a follow-up appointment with Dr. Althea CharonKaramalegos in 1 to 3 months for follow-up Memory Loss.  If you have any other questions or concerns, please feel free to call the clinic to contact me. You may also schedule an earlier appointment if necessary.  However, if your symptoms get significantly worse, please go to the Emergency Department to seek immediate medical attention.  Destiny Moore Destiny Paquette, DO [facility]   Dementia Dementia is a word that is used to describe problems with the brain and how it works. People with dementia have memory loss. They may also have problems with thinking, speaking, or solving problems. It can affect how they act around people, how they do their job, their mood, and their personality. These changes may not show up for a long time. Family or friends may not notice problems in the early part of this disease. HOME CARE The following tips are for the person living with, or caring for, the person with dementia. Make the home safe.  Remove locks on bathroom doors.  Use childproof locks on cabinets where alcohol, cleaning supplies, or chemicals are stored.  Put outlet covers in electrical outlets.  Put in childproof locks to keep doors and windows safe.  Remove  stove knobs, or put in safety knobs that shut off on their own.  Lower the temperature on water heaters.  Label medicines. Lock them in a safe place.  Keep knives, lighters, matches, power tools, and guns out of reach or in a safe place.  Remove objects that might break or can hurt the person.  Make sure lighting is good inside and outside.  Put in grab bars if needed.  Use a device that detects falls or other needs for help. Lessen confusion.  Keep familiar objects and people around.  Use night lights or low lit (dim) lights at night.  Label objects or areas.  Use reminders, notes, or directions for daily activities or tasks.  Keep a simple routine that is the same for waking, meals, bathing, dressing, and bedtime.  Create a calm and quiet home.  Put up clocks and calendars.  Keep emergency numbers and the home address near all phones.  Help show the different times of day. Open the curtains during the day to let light in. Speak clearly and directly.  Choose simple words and short sentences.  Use a gentle, calm voice.  Do not interrupt.  If the person has a hard time finding a word to use, give them the word or thought.  Ask 1 question at a time. Give enough time for the person to answer. Repeat the question if the person does not answer. Do things that lessen restlessness.  Provide a comfortable bed.  Have the same  bedtime routine every night.  Have a regular walking and activity schedule.  Lessen naps during the day.  Do not let the person drink a lot of caffeine.  Go to events that are not overwhelming. Eat well and drink fluids.  Lessen distractions during meal times and snacks.  Avoid foods that are too hot or too cold.  Watch how the person chews and swallows. This is to make sure they do not choke. Other  Keep all vision, hearing, dental, and medical visits with the doctor.  Only give medicines as told by the doctor.  Watch the person's  driving ability. Do not let the person drive if he or she cannot drive safely.  Use a program that helps find a person if they become missing. You may need to register with this program. GET HELP RIGHT AWAY IF:   A fever of 102 F (38.9 C) develops.  Confusion develops or gets worse.  Sleepiness develops or gets worse.  Staying awake is hard to do.  New behavior problems start like mood swings, aggression, and seeing things that are not there.  Problems with balance, speech, or falling develop.  Problems swallowing develop.  Any problems of another sickness develop. MAKE SURE YOU:  Understand these instructions.  Will watch his or her condition.  Will get help right away if he or she is not doing well or gets worse. Document Released: 03/31/2008 Document Revised: 07/11/2011 Document Reviewed: 09/13/2010 [facility] Patient Information 2015 [place], [place]. This information is not intended to replace advice given to you by your health care provider. Make sure you discuss any questions you have with your health care provider.  3

## 2014-05-14 NOTE — Assessment & Plan Note (Signed)
Significantly improved RUQ abdominal pain s/p lap cholecystectomy 05/08/14, no acute evidence of cholecystitis or choledocholithiasis, had MRCP prior to surgery. Seems to have mostly resolved secondary symptoms of weakness and dizziness, likely caused by significant pain from initial acute cholelithiasis. - Well-appearing today, w/o complications. Post-op recovering well. No signs of infection.  Plan: 1. New rx for Norco 5/325 1 tab q 8 hr PRN (#20, 0 refills) for short-term post-op pain 2. Increase activity as tolerated, continue routine post-op care per general surgery (note steri-strips still in place) 3. F/u with scheduled [facility] apt post-op, advised to contact with questions regarding suture removal

## 2014-05-14 NOTE — Progress Notes (Signed)
   Subjective:    Patient ID: Destiny Moore, female    DOB: 04-17-1933, 81 y.o.   MRN: [id number]  Patient presents for a hospital follow-up appointment.  HPI  Recent hospitalization: 05/05/14 to 05/09/14, for RUQ abdominal pain dx with cholelithiasis, s/p lap cholecystectomy.   CHOLELITHIASIS, S/P LAP CHOLECYSTECTOMY: - Reviewed hospitalization and recent discharge summary. [facility]14 for RUQ abd pain, at that time sent to ER for evaluation, later admitted with dx cholelithiasis and concern for bile duct dilatation. Overall, hospital course went well, pain controlled, no acute cholecystitis or choledocholithiasis. Had MRCP done, then consulted general surgery who performed lap cholecystectomy on 05/08/14, discharged the following day - Today patient reports doing overall "much better", states he tolerated the surgery well, out of recovery in 45 min. Reports some mild persistent abd pain, localized to incision sites and some soreness in RUQ, otherwise pain controlled on occasional Norco while in hospital. States there was a problem with the rx and did not receive on discharge. Requesting Norco rx today. - Eating and drinking well, improved function at home now with reduced pain, has husband, son, and daughter at home for helping as needed - Denies any fevers/chills, nausea, vomiting, worsening abd pain, redness or swelling around surgery sites  MEMORY LOSS: - Patient's husband reports some concerns with chronic history gradual worsening memory loss and forgetfulness, stated that their children were concerned about this as well, and asking about possibility of dementia - Denies any abnormal behavior / mood swings / agitation or violent behavior. No other inappropriate or other cognitive symptoms at this time  I have reviewed and updated the following as appropriate: allergies and current medications  Social Hx: - Never smoker  Review of Systems  See above HPI      Objective:   Physical Exam  BP 135/84 mmHg  Pulse 81  Temp(Src) 98.5 F (36.9 C) (Oral)  Ht 5\' 1"  (1.549 m)  Wt 144 lb (65.318 kg)  BMI 27.22 kg/m2  Gen - elderly F, well-appearing, NAD HEENT - oropharynx clear, MMM Heart - RRR, no murmurs heard Lungs - CTAB. Normal work of breathing. Abd - soft, minimally tender to palpation RUQ and mid-abdomen, with multiple small incisions noted x 4 for lap instruments. Non distended, no masses, +active BS Ext - non-tender, no edema, peripheral pulses intact +2 b/l Skin - warm, dry, steri-streps in place clean and dry, no erythema, drainage or edema around surgical incision sites on abd Neuro - awake, alert, oriented, grossly non-focal, gait normal    Assessment & Plan:   See specific A&P problem list for details.

## 2014-05-14 NOTE — Assessment & Plan Note (Signed)
Resolved. S/p lap cholecystectomy 05/08/14

## 2014-05-14 NOTE — Assessment & Plan Note (Signed)
Chronic gradual memory loss / forgetfulness, reported by husband and daughters concerned. - No significant behavioral / mood symptoms of dementia - Daily functioning does not seem to be impaired, performs ADLs - No prior dementia screening / work-up  Plan: 1. Discuss memory loss history in more detail at next follow-up, anticipate mini-cog screening tool 2. Consider recommend scheduling [facility] for further evaluation / screening

## 2014-06-30 ENCOUNTER — Ambulatory Visit (INDEPENDENT_AMBULATORY_CARE_PROVIDER_SITE_OTHER): Payer: Medicare Other | Admitting: Family Medicine

## 2014-06-30 ENCOUNTER — Encounter: Payer: Self-pay | Admitting: Family Medicine

## 2014-06-30 VITALS — BP 145/80 | HR 78 | Temp 98.3°F | Wt 148.3 lb

## 2014-06-30 DIAGNOSIS — R399 Unspecified symptoms and signs involving the genitourinary system: Secondary | ICD-10-CM

## 2014-06-30 DIAGNOSIS — R300 Dysuria: Secondary | ICD-10-CM | POA: Diagnosis not present

## 2014-06-30 LAB — POCT URINALYSIS DIPSTICK
BILIRUBIN UA: NEGATIVE
Blood, UA: NEGATIVE
Glucose, UA: NEGATIVE
Nitrite, UA: NEGATIVE
PH UA: 5.5
Protein, UA: NEGATIVE
Spec Grav, UA: 1.02
Urobilinogen, UA: 0.2

## 2014-06-30 MED ORDER — CEPHALEXIN 500 MG PO CAPS
500.0000 mg | ORAL_CAPSULE | Freq: Three times a day (TID) | ORAL | Status: DC
Start: 2014-06-30 — End: 2014-08-01

## 2014-06-30 NOTE — Assessment & Plan Note (Signed)
Concern for UTI given 1 week symptoms burning with urination; all other symptoms sound like they could be chronic with urgency/incontinence that she has been evaluated for before and diagnosed with overactive bladder (feel once current symptoms resolve may be good candidate for medical therapy for symptom relief). UA today with only moderate leukocytes. Will give keflex for presumptive UTI, send urine for culture. Asked patient to follow up in the next month with PCP to discuss ongoing urinary symptoms.

## 2014-06-30 NOTE — Progress Notes (Signed)
   Subjective:    Patient ID: Hardie LoraEvelyn L Moldovan, female    DOB: 13-Jun-1932, 81 y.o.   MRN: [id number]  HPI  Patient presents for Same Day Appointment  CC: kidney problem  # Burning urination:  Concerned she is having issues with her kidneys  Started 1 week ago: burning with urination, going more frequently, urgency with incontinence (though states she has an ongoing issue with incontinence)  Feels some pain in her left flank but not consistent, some pain in lower abdomen  No recent history of UTI ROS: no fevers/chills, no n/v/d  Review of Systems   See HPI for ROS. All other systems reviewed and are negative.  Past medical history, surgical, family, and social history reviewed and updated in the EMR as appropriate.  Objective:  BP 145/80 mmHg  Pulse 78  Temp(Src) 98.3 F (36.8 C) (Oral)  Wt 148 lb 5 oz (67.274 kg) Vitals and nursing note reviewed  General: NAD CV: RRR, normal s1 and s2, no mrg Resp: clear bilaterally Back: no CVA tenderness Neuro: alert, oriented but does act slightly confused while in the room at times  Assessment & Plan:  See Problem List Documentation

## 2014-07-02 LAB — URINE CULTURE

## 2014-07-21 ENCOUNTER — Other Ambulatory Visit: Payer: Self-pay | Admitting: Family Medicine

## 2014-07-21 DIAGNOSIS — K219 Gastro-esophageal reflux disease without esophagitis: Secondary | ICD-10-CM

## 2014-08-01 ENCOUNTER — Ambulatory Visit (INDEPENDENT_AMBULATORY_CARE_PROVIDER_SITE_OTHER): Payer: Medicare Other | Admitting: Family Medicine

## 2014-08-01 ENCOUNTER — Encounter: Payer: Self-pay | Admitting: Family Medicine

## 2014-08-01 VITALS — BP 151/81 | HR 60 | Temp 98.1°F | Ht 61.0 in | Wt 144.0 lb

## 2014-08-01 DIAGNOSIS — M810 Age-related osteoporosis without current pathological fracture: Secondary | ICD-10-CM

## 2014-08-01 DIAGNOSIS — R040 Epistaxis: Secondary | ICD-10-CM

## 2014-08-01 DIAGNOSIS — Z23 Encounter for immunization: Secondary | ICD-10-CM | POA: Diagnosis not present

## 2014-08-01 DIAGNOSIS — E785 Hyperlipidemia, unspecified: Secondary | ICD-10-CM | POA: Diagnosis not present

## 2014-08-01 DIAGNOSIS — M150 Primary generalized (osteo)arthritis: Secondary | ICD-10-CM

## 2014-08-01 DIAGNOSIS — M159 Polyosteoarthritis, unspecified: Secondary | ICD-10-CM

## 2014-08-01 DIAGNOSIS — R300 Dysuria: Secondary | ICD-10-CM

## 2014-08-01 MED ORDER — SIMVASTATIN 20 MG PO TABS: 20.0000 mg | ORAL_TABLET | Freq: Every day | ORAL | Status: DC

## 2014-08-01 MED ORDER — ALENDRONATE SODIUM 70 MG PO TABS: 70.0000 mg | ORAL_TABLET | ORAL | Status: DC

## 2014-08-01 MED ORDER — HYDROCODONE-ACETAMINOPHEN 5-325 MG PO TABS: 1.0000 | ORAL_TABLET | Freq: Three times a day (TID) | ORAL | Status: DC | PRN

## 2014-08-01 NOTE — Assessment & Plan Note (Signed)
Resolved, isolated episode. Likely due to recent URI. - Continue ASA 81mg  daily - May use nasal saline

## 2014-08-01 NOTE — Assessment & Plan Note (Signed)
Refilled Norco half 5/325 #30 tabs, 0 refills. Advised pt that if will continue in future will need to establish with [facility] pain [facility], UDS and follow chronic pain protocol

## 2014-08-01 NOTE — Progress Notes (Signed)
   Subjective:    Patient ID: Destiny Moore, female    DOB: Mar 14, 1933, 81 y.o.   MRN: [id number]  HPI  CHRONIC HTN: Reports no concerns Current Meds - Amlodipine 10mg  daily, HCTZ 12.5mg  daily   Reports good compliance, took meds today. Tolerating well, w/o complaints. Denies CP, dyspnea, HA, edema, dizziness / lightheadedness  UTI FOLLOW-UP: - [facility] for UTI with symptoms of dysuria, had UA suggestive for infection, empirically treated with Keflex 500mg  TID x 7 days, urine culture showed 50k colonies GBS - Today patient reports resolved dysuria after completing antibiotics. No further questions or concerns. - Denies fevers/chills, abd or flank pain, dysuria, hematuria  NOSEBLEEDING: - Reports episode of nosebleed last night, resolved with pressure. States she has recently recovered from URI x 1-2 weeks, and has been sneezing and blowing her nose a lot. Denies any trauma, fall or accident. - No other bleeding - Taking ASA 81mg  daily, and questions if she should continue  CHRONIC PAIN / OSTEOARTHRITIS, multiple joints: - Chronic history with OA - Takes half of Norco 5/325 2-3x weekly for variety of pains in bilateral shoulders and back, nearly out currently and requesting refill today  Requests refills on multiple medications  I have reviewed and updated the following as appropriate: allergies and current medications  Social Hx: - Never smoker - Recent loss of her sister, Destiny Moore (passed 07/13/14, heart problems), patient overall expressed feeling sad but coping with her loss and has good family support system  Review of Systems  See above HPI    Objective:   Physical Exam  BP 151/81 mmHg  Pulse 60  Temp(Src) 98.1 F (36.7 C) (Oral)  Ht 5\' 1"  (1.549 m)  Wt 144 lb (65.318 kg)  BMI 27.22 kg/m2  Gen - well-appearing, sad when discussing loss of her sister this month otherwise well and pleasant, NAD HEENT - bilateral nares patent with mild scab formed  right nare anterior tissue no active bleeding, oropharynx clear, MMM Heart - RRR, no murmurs heard Lungs - CTAB. Normal work of breathing. MSK - bilateral upper thoracic paraspinal muscles mild +TTP Ext - non-tender, no edema, peripheral pulses intact +2 b/l Skin - warm, dry Neuro - awake, alert, oriented, grossly non-focal, gait normal    Assessment & Plan:   See specific A&P problem list for details.

## 2014-08-01 NOTE — Assessment & Plan Note (Signed)
Resolved

## 2014-08-01 NOTE — Assessment & Plan Note (Signed)
Stable BP, at goal < 150 / 90 - Concern for risk hypotension  Plan: 1. Continue Amlodipine 10mg  daily, HCTZ 12.5mg  daily 2. Last BMET 05/2014 with Cr 1.37, stable 3. Monitor BP at home 4. Consider reducing anti-HTN meds in future, Amlodipine to 5mg  to see if tolerated

## 2014-08-01 NOTE — Patient Instructions (Signed)
Dear Herma MeringEvelyn Schrade, Thank you for coming in to clinic today. It was good to see you! I am sorry for the loss of your sister.  1. Today you will receive Pneumonia vaccine and Tetanus vaccine (TDap) 2. Check cholesterol lab - good for 1 year. Will call you if we need to change any medicine 3. Refilled Fosamax, Simvastatin, Norco - call if need other refills. 4. Please call your Eye Doctor for annual eye exam and refill on eye drops  Please schedule a follow-up appointment with Dr. Althea CharonKaramalegos in 3 to 6 months for follow-up.  If you have any other questions or concerns, please feel free to call the clinic to contact me. You may also schedule an earlier appointment if necessary.  However, if your symptoms get significantly worse, please go to the Emergency Department to seek immediate medical attention.  Saralyn PilarAlexander Jaecion Dempster, DO [facility]

## 2014-08-01 NOTE — Assessment & Plan Note (Signed)
Refill Fosamax

## 2014-08-02 LAB — LDL CHOLESTEROL, DIRECT: LDL DIRECT: 85 mg/dL

## 2014-08-06 ENCOUNTER — Other Ambulatory Visit: Payer: Self-pay | Admitting: Family Medicine

## 2014-08-06 DIAGNOSIS — I10 Essential (primary) hypertension: Secondary | ICD-10-CM

## 2014-08-08 ENCOUNTER — Other Ambulatory Visit: Payer: Self-pay | Admitting: Family Medicine

## 2014-08-08 DIAGNOSIS — K219 Gastro-esophageal reflux disease without esophagitis: Secondary | ICD-10-CM

## 2014-09-01 ENCOUNTER — Encounter (HOSPITAL_COMMUNITY): Payer: Self-pay | Admitting: *Deleted

## 2014-09-01 ENCOUNTER — Emergency Department (HOSPITAL_COMMUNITY): Payer: Medicare Other

## 2014-09-01 ENCOUNTER — Emergency Department (HOSPITAL_COMMUNITY)
Admission: EM | Admit: 2014-09-01 | Discharge: 2014-09-01 | Disposition: A | Discharge status: Home / Self Care | Payer: Medicare Other | Attending: Emergency Medicine | Admitting: Emergency Medicine

## 2014-09-01 DIAGNOSIS — R630 Anorexia: Secondary | ICD-10-CM | POA: Insufficient documentation

## 2014-09-01 DIAGNOSIS — H9222 Otorrhagia, left ear: Secondary | ICD-10-CM | POA: Diagnosis present

## 2014-09-01 DIAGNOSIS — N183 Chronic kidney disease, stage 3 (moderate): Secondary | ICD-10-CM | POA: Diagnosis not present

## 2014-09-01 DIAGNOSIS — E785 Hyperlipidemia, unspecified: Secondary | ICD-10-CM | POA: Insufficient documentation

## 2014-09-01 DIAGNOSIS — R0789 Other chest pain: Secondary | ICD-10-CM | POA: Diagnosis not present

## 2014-09-01 DIAGNOSIS — F419 Anxiety disorder, unspecified: Secondary | ICD-10-CM | POA: Diagnosis not present

## 2014-09-01 DIAGNOSIS — R079 Chest pain, unspecified: Secondary | ICD-10-CM

## 2014-09-01 DIAGNOSIS — R110 Nausea: Secondary | ICD-10-CM | POA: Diagnosis not present

## 2014-09-01 DIAGNOSIS — R600 Localized edema: Secondary | ICD-10-CM | POA: Diagnosis not present

## 2014-09-01 DIAGNOSIS — F329 Major depressive disorder, single episode, unspecified: Secondary | ICD-10-CM | POA: Insufficient documentation

## 2014-09-01 DIAGNOSIS — Z79899 Other long term (current) drug therapy: Secondary | ICD-10-CM | POA: Insufficient documentation

## 2014-09-01 DIAGNOSIS — I129 Hypertensive chronic kidney disease with stage 1 through stage 4 chronic kidney disease, or unspecified chronic kidney disease: Secondary | ICD-10-CM | POA: Diagnosis not present

## 2014-09-01 DIAGNOSIS — H61892 Other specified disorders of left external ear: Secondary | ICD-10-CM | POA: Insufficient documentation

## 2014-09-01 DIAGNOSIS — R0602 Shortness of breath: Secondary | ICD-10-CM | POA: Insufficient documentation

## 2014-09-01 DIAGNOSIS — H6192 Disorder of left external ear, unspecified: Secondary | ICD-10-CM

## 2014-09-01 DIAGNOSIS — Z7982 Long term (current) use of aspirin: Secondary | ICD-10-CM | POA: Insufficient documentation

## 2014-09-01 DIAGNOSIS — M810 Age-related osteoporosis without current pathological fracture: Secondary | ICD-10-CM | POA: Diagnosis not present

## 2014-09-01 DIAGNOSIS — R05 Cough: Secondary | ICD-10-CM | POA: Insufficient documentation

## 2014-09-01 DIAGNOSIS — Z87440 Personal history of urinary (tract) infections: Secondary | ICD-10-CM | POA: Diagnosis not present

## 2014-09-01 LAB — BASIC METABOLIC PANEL
Anion gap: 12 (ref 5–15)
BUN: 17 mg/dL (ref 6–20)
CALCIUM: 9.4 mg/dL (ref 8.9–10.3)
CHLORIDE: 104 mmol/L (ref 101–111)
CO2: 24 mmol/L (ref 22–32)
Creatinine, Ser: 1.44 mg/dL — ABNORMAL HIGH (ref 0.44–1.00)
GFR calc Af Amer: 39 mL/min — ABNORMAL LOW (ref >60)
GFR, EST NON AFRICAN AMERICAN: 34 mL/min — AB (ref >60)
GLUCOSE: 109 mg/dL — AB (ref 70–99)
Potassium: 3.5 mmol/L (ref 3.5–5.1)
Sodium: 140 mmol/L (ref 135–145)

## 2014-09-01 LAB — CBC
HEMATOCRIT: 37.8 % (ref 36.0–46.0)
Hemoglobin: 12.5 g/dL (ref 12.0–15.0)
MCH: 29.8 pg (ref 26.0–34.0)
MCHC: 33.1 g/dL (ref 30.0–36.0)
MCV: 90.2 fL (ref 78.0–100.0)
Platelets: 219 10*3/uL (ref 150–400)
RBC: 4.19 MIL/uL (ref 3.87–5.11)
RDW: 13.2 % (ref 11.5–15.5)
WBC: 5.7 10*3/uL (ref 4.0–10.5)

## 2014-09-01 LAB — D-DIMER, QUANTITATIVE (NOT AT ARMC): D DIMER QUANT: 0.58 ug{FEU}/mL — AB (ref 0.00–0.48)

## 2014-09-01 LAB — TROPONIN I

## 2014-09-01 NOTE — ED Notes (Signed)
Pt came today for bleeding from her L ear.  No active bleeding at this time.  She states that while she's here she'd like us to look at her heart - states that last night she had shooting pain that shot from her L chest to her L shoulder.  Also c/o headache last night.

## 2014-09-01 NOTE — ED Notes (Signed)
MD at bedside. 

## 2014-09-01 NOTE — Discharge Instructions (Signed)

## 2014-09-02 NOTE — ED Provider Notes (Signed)
CSN: [id number]     Arrival date & time 09/01/14  1454 History   First MD Initiated Contact with Patient 09/01/14 1939     Chief Complaint  Patient presents with  . 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  She felt SOB and nauseated with the pain.  Also states her left arm locked up on her and she was unable to move the left arm without severe pain during this 1 hour time frame.  She would get short bursts of seconds worth of chest pain back to back for 1 hour that then resolved.  She has never had sx like that in the past and follows with Dr. Elease Hashimoto without prior hx of cardiac disease.  Last stress test was years ago.  Pt can't recall exacerbating factors and pain resolved spontaneously.  Pt denies any pain today.   She states she came today due to bleeding from the ear.  No prior hx of same and denies any trauma or using q-tips.  No pain or hearing changes.  Patient is a 81 y.o. female presenting with chest pain. The history is provided by the patient.  Chest Pain Pain location:  L chest Pain quality: sharp and shooting   Pain radiates to:  Does not radiate Pain radiates to the back: no   Pain severity:  Moderate Onset quality:  Sudden Duration:  1 hour Timing:  Constant Progression:  Resolved Chronicity:  New Context comment:  Started last night before bed. Relieved by:  None tried Exacerbated by: trying to move left arm made it worse. Ineffective treatments:  None tried Associated symptoms: anorexia, cough, lower extremity edema, nausea and shortness of breath   Associated symptoms: no abdominal pain, no diaphoresis and not vomiting   Risk factors: hypertension and surgery   Risk factors: no coronary artery disease, no diabetes mellitus, no immobilization, no  prior DVT/PE and no smoking     Past Medical History  Diagnosis Date  . Hypertension   . Hyperlipidemia   . Anxiety   . UTI (urinary tract infection)   . Depression   . Osteoporosis   . Allergy   . CKD (chronic kidney disease)     stage 3 as of 04/2014.    Past Surgical History  Procedure Laterality Date  . Hystrectomy    . Bladder tact    . Abdominal hysterectomy    . Incontinence surgery    . Cholecystectomy N/A 05/08/2014    Procedure: LAPAROSCOPIC CHOLECYSTECTOMY WITH INTRAOPERATIVE CHOLANGIOGRAM;  Surgeon: Manus Rudd, MD;  Location: [facility] OR;  Service: General;  Laterality: N/A;   Family History  Problem Relation Age of Onset  . Heart disease Mother   . Cancer Sister     breast  . Cancer Brother     skin, kidney, lung   History  Substance Use Topics  . Smoking status: Never Smoker   . Smokeless tobacco: Never Used  . Alcohol Use: No   OB History    No data available     Review of Systems  Constitutional: Negative for diaphoresis.  HENT: Positive for ear discharge.   Respiratory: Positive for cough and shortness of breath.   Cardiovascular: Positive for chest pain.  Gastrointestinal: Positive for nausea and anorexia. Negative for vomiting and abdominal pain.  All other systems reviewed and  are negative.     Allergies  Coreg; Lisinopril; Sertraline hcl; and Carvedilol  Home Medications   Prior to Admission medications   Medication Sig Start Date End Date Taking? Authorizing Provider  alendronate (FOSAMAX) 70 MG tablet Take 1 tablet (70 mg total) by mouth once a week. Take with a full glass of water on an empty stomach. On Monday 08/01/14  Yes Alexander J Karamalegos, DO  amLODipine (NORVASC) 10 MG tablet TAKE 1 TABLET BY MOUTH DAILY. 08/06/14  Yes Smitty CordsAlexander J Karamalegos, DO  aspirin 81 MG tablet Take 81 mg by mouth daily.   Yes Historical Provider, MD  B Complex-C (B-COMPLEX WITH VITAMIN C) tablet Take 1 tablet by mouth daily.   Yes Historical Provider,  MD  DUREZOL 0.05 % EMUL Place 1 drop into the right eye daily.  03/11/14  Yes Historical Provider, MD  EPINEPHrine (EPIPEN) 0.3 mg/0.3 mL DEVI Inject 0.3 mg into the muscle as needed (for allergic reaction).    Yes Historical Provider, MD  gabapentin (NEURONTIN) 100 MG capsule TAKE 3 CAPSULES BY MOUTH EVERY DAY AT BEDTIME 04/30/14  Yes Alexander J Karamalegos, DO  hydrochlorothiazide (MICROZIDE) 12.5 MG capsule TAKE 1 CAPSULE BY MOUTH DAILY. 08/06/14  Yes Smitty CordsAlexander J Karamalegos, DO  HYDROcodone-acetaminophen (NORCO/VICODIN) 5-325 MG per tablet Take 1 tablet by mouth every 8 (eight) hours as needed for moderate pain. 08/01/14  Yes Alexander J Karamalegos, DO  pantoprazole (PROTONIX) 40 MG tablet TAKE 1 TABLET BY MOUTH DAILY BEFORE BREAKFAST. 08/08/14  Yes Alexander J Karamalegos, DO  ranitidine (ZANTAC) 150 MG tablet TAKE 1 TABLET (150 MG TOTAL) BY MOUTH 2 (TWO) TIMES DAILY. 07/21/14  Yes Alexander Fredric MareJ Karamalegos, DO  simvastatin (ZOCOR) 20 MG tablet Take 1 tablet (20 mg total) by mouth daily. 08/01/14  Yes Smitty CordsAlexander J Karamalegos, DO  loperamide (IMODIUM) 2 MG capsule Take 1 capsule (2 mg total) by mouth as needed for diarrhea or loose stools. Patient not taking: Reported on 04/12/2014 02/27/14   Shon Batonourtney F Horton, MD  ondansetron (ZOFRAN) 4 MG tablet Take 1 tablet (4 mg total) by mouth every 8 (eight) hours as needed for nausea or vomiting. Patient not taking: Reported on 04/12/2014 02/27/14   Shon Batonourtney F Horton, MD   BP 139/68 mmHg  Pulse 68  Temp(Src) 98 F (36.7 C) (Oral)  Resp 16  Ht 5' (1.524 m)  Wt 144 lb (65.318 kg)  BMI 28.12 kg/m2  SpO2 95% Physical Exam  Constitutional: She is oriented to person, place, and time. She appears well-developed and well-nourished. No distress.  HENT:  Head: Normocephalic and atraumatic.  Right Ear: Tympanic membrane and ear canal normal.  Left Ear: Tympanic membrane normal.  Mouth/Throat: Oropharynx is clear and moist.  Left ear medial canal aBOUT 9 o'clock  with freshly clotted blood.  Circular black broadbased lesion with surrounding erythema.  No active bleeding  Eyes: Conjunctivae and EOM are normal. Pupils are equal, round, and reactive to light.  Neck: Normal range of motion. Neck supple.  Cardiovascular: Normal rate, regular rhythm and intact distal pulses.   No murmur heard. Pulmonary/Chest: Effort normal and breath sounds normal. No respiratory distress. She has no wheezes. She has no rales. She exhibits no tenderness.  Abdominal: Soft. She exhibits no distension. There is no tenderness. There is no rebound and no guarding.  Musculoskeletal: Normal range of motion. She exhibits edema and tenderness.  Trace edema in bilateral lower ext and tenderness with palpation of ankles.  No calf tenderness  Neurological: She is  alert and oriented to person, place, and time.  Skin: Skin is warm and dry. No rash noted. No erythema.  Psychiatric: She has a normal mood and affect. Her behavior is normal.  Nursing note and vitals reviewed.   ED Course  Procedures (including critical care time) Labs Review Labs Reviewed  BASIC METABOLIC PANEL - Abnormal; Notable for the following:    Glucose, Bld 109 (*)    Creatinine, Ser 1.44 (*)    GFR calc non Af Amer 34 (*)    GFR calc Af Amer 39 (*)    All other components within normal limits  D-DIMER, QUANTITATIVE - Abnormal; Notable for the following:    D-Dimer, Quant 0.58 (*)    All other components within normal limits  TROPONIN I  CBC    Imaging Review Dg Chest 2 View  09/01/2014   CLINICAL DATA:  Bleeding from her left ear. Yesterday she had left chest pain radiating to left shoulder.  EXAM: CHEST  2 VIEW  COMPARISON:  04/08/2014  FINDINGS: There is borderline cardiomegaly, unchanged. Hilar and mediastinal contours are unremarkable and unchanged. The lungs are clear. The pulmonary vasculature is normal. There are no pleural effusions.  IMPRESSION: No active cardiopulmonary disease.   Electronically  Signed   By: Ellery Plunk M.D.   On: 09/01/2014 21:13     EKG Interpretation   Date/Time:  Monday Sep 01 2014 15:54:12 EDT Ventricular Rate:  76 PR Interval:  152 QRS Duration: 82 QT Interval:  380 QTC Calculation: 427 R Axis:   2 Text Interpretation:  Normal sinus rhythm Normal ECG No significant change  since last tracing Confirmed by Anitra Lauth  MD, Alphonzo Lemmings ([id number]) on 09/01/2014  7:44:27 PM      MDM   Final diagnoses:  Chest pain  Atypical chest pain  Lesion of external ear canal, left    Patient with bleeding of the left ear canal today with evidence of black lesion in the ear concerning for melanoma or other other skin pathology with bleeding which is currently hemostatic.  Pt has normal TM and not on anticoagulation.  Feel pt needs f/u with ENT for possible biopsy.  Secondly pt had 1 hour of chest pain appx 24hrs ago that has not returned.  She denies every having similar sx and have not returned.  She follows with cardiology without hx of prior CAD.  Risk factors include HTN, HLD and age.  Heart score of 3.  Low risk well's and age adjusted d-dimer is neg.  Surgery in Jan but no new leg swelling or pain.  Pt does c/o of mild non productive cough but unknow length of time and CXR wnl.  Pt denies any food intake prior to sx started but does have hx of GERD on zantac and protonix. EKG and trop are without acute findings 24hrs after pain making NSTEMI unlikely.  Pt is well appearing and given normal labs otherwise spoke with pt about close outpt f/u with Dr. Elease Hashimoto by calling office tomorrow for follow appt and possible stress testing.    Gwyneth Sprout, MD 09/02/14 [phone]

## 2014-09-16 ENCOUNTER — Other Ambulatory Visit: Payer: Self-pay | Admitting: Family Medicine

## 2014-09-16 DIAGNOSIS — M810 Age-related osteoporosis without current pathological fracture: Secondary | ICD-10-CM

## 2014-09-16 DIAGNOSIS — M79605 Pain in left leg: Secondary | ICD-10-CM

## 2014-09-16 DIAGNOSIS — M79604 Pain in right leg: Secondary | ICD-10-CM

## 2014-10-07 NOTE — Progress Notes (Signed)
Hardie LoraEvelyn L Haapala Date of Birth  May 16, 1932       [facility]    [place] [facility] [address], [address]  [address], [address] [place], [place]  [address]   [place]  [id number] [phone]     [phone]   Fax  [phone]    Fax [phone]  Problem List: 1. Hypertension 2. hyperlipidemia 3. Mild PVD - mild right carotid stenosis 4. Chest pain - normal stress myoview in 2001  October 08, 2014:  Destiny Moore is a 81 yo with hx of CP in the past.  She was last seen in 2014. BP has been well controlled No CP or dyspnea.      Current Outpatient Prescriptions on File Prior to Visit  Medication Sig Dispense Refill  . alendronate (FOSAMAX) 70 MG tablet TAKE 1 TABLET EVERY 7 DAYS WITH A FULL GLASS OF WATER ON AN EMPTY STOMACH 4 tablet 11  . amLODipine (NORVASC) 10 MG tablet TAKE 1 TABLET BY MOUTH DAILY. 90 tablet 3  . aspirin 81 MG tablet Take 81 mg by mouth daily.    . B Complex-C (B-COMPLEX WITH VITAMIN C) tablet Take 1 tablet by mouth daily.    . DUREZOL 0.05 % EMUL Place 1 drop into the right eye daily.   0  . EPINEPHrine (EPIPEN) 0.3 mg/0.3 mL DEVI Inject 0.3 mg into the muscle as needed (for allergic reaction).     . gabapentin (NEURONTIN) 100 MG capsule TAKE 3 CAPSULES BY MOUTH EVERY DAY AT BEDTIME 90 capsule 3  . hydrochlorothiazide (MICROZIDE) 12.5 MG capsule TAKE 1 CAPSULE BY MOUTH DAILY. 90 capsule 3  . HYDROcodone-acetaminophen (NORCO/VICODIN) 5-325 MG per tablet Take 1 tablet by mouth every 8 (eight) hours as needed for moderate pain. 30 tablet 0  . loperamide (IMODIUM) 2 MG capsule Take 1 capsule (2 mg total) by mouth as needed for diarrhea or loose stools. 30 capsule 0  . ondansetron (ZOFRAN) 4 MG tablet Take 1 tablet (4 mg total) by mouth every 8 (eight) hours as needed for nausea or vomiting. 20 tablet 0  . pantoprazole (PROTONIX) 40 MG tablet TAKE 1 TABLET BY MOUTH DAILY BEFORE BREAKFAST. 90 tablet 3  . ranitidine (ZANTAC) 150 MG tablet TAKE 1  TABLET (150 MG TOTAL) BY MOUTH 2 (TWO) TIMES DAILY. 60 tablet 1  . simvastatin (ZOCOR) 20 MG tablet Take 1 tablet (20 mg total) by mouth daily. 30 tablet 5   No current facility-administered medications on file prior to visit.    Allergies  Allergen Reactions  . Coreg Anaphylaxis  . Lisinopril Anaphylaxis    REACTION: Anaphylaxis  . Sertraline Hcl Other (See Comments)    Could not feed herself  . Carvedilol Other (See Comments)    Unknown     Past Medical History  Diagnosis Date  . Hypertension   . Hyperlipidemia   . Anxiety   . UTI (urinary tract infection)   . Depression   . Osteoporosis   . Allergy   . CKD (chronic kidney disease)     stage 3 as of 04/2014.     Past Surgical History  Procedure Laterality Date  . Hystrectomy    . Bladder tact    . Abdominal hysterectomy    . Incontinence surgery    . Cholecystectomy N/A 05/08/2014    Procedure: LAPAROSCOPIC CHOLECYSTECTOMY WITH INTRAOPERATIVE CHOLANGIOGRAM;  Surgeon: Manus RuddMatthew Tsuei, MD;  Location: [facility] OR;  Service: General;  Laterality: N/A;    History  Smoking status  . Never Smoker   Smokeless tobacco  . Never Used    History  Alcohol Use No    Family History  Problem Relation Age of Onset  . Heart disease Mother   . Cancer Sister     breast  . Cancer Brother     skin, kidney, lung    Reviw of Systems:  Reviewed in the HPI.  All other systems are negative.  Physical Exam: Blood pressure 130/72, pulse 59, height 5' (1.524 m), weight 66.407 kg (146 lb 6.4 oz), SpO2 95 %. General: Well developed, well nourished, in no acute distress.  Head: Normocephalic, atraumatic, sclera non-icteric, mucus membranes are moist,   Neck: Supple. Carotids are 2 + without bruits. No JVD   Lungs: Clear   Heart: RR, normal S1, S2  Abdomen: Soft, non-tender, non-distended with normal bowel sounds.  Msk:  Strength and tone are normal  Extremities: No clubbing or cyanosis. No edema.  Distal pedal pulses are 2+ and  equal    Neuro: CN II - XII intact.  Alert and oriented X 3.   Psych:  Normal   ECG:  Assessment / Plan:   1. Noncardiac chest pain : Patient has had noncardiac chest pain for years. She had an episode of chest pain last month and was evaluated in the emergency room. Her workup was unremarkable. She's not had any further episodes of pain.  2. Essential hypertension: Continue current medications. Her blood pressure is well-controlled. She was mildly hypokalemic in the emergency room last month. We'll recheck her levels today. We may need to add some potassium chloride. Her again in one year for OV and fasting labs.     Nahser, Deloris Ping, MD  10/08/2014 8:19 AM    [facility] [address] [place],  [address] [place], [place]  [address] Pager [phone] Phone: [phone]; Fax: [phone]   [facility]  [address] [place], [place]  [address] [phone]    Fax [phone]

## 2014-10-08 ENCOUNTER — Encounter: Payer: Self-pay | Admitting: Cardiovascular Disease

## 2014-10-08 ENCOUNTER — Ambulatory Visit (INDEPENDENT_AMBULATORY_CARE_PROVIDER_SITE_OTHER): Payer: Medicare Other | Admitting: Cardiovascular Disease

## 2014-10-08 VITALS — BP 130/72 | HR 59 | Ht 60.0 in | Wt 146.4 lb

## 2014-10-08 DIAGNOSIS — E785 Hyperlipidemia, unspecified: Secondary | ICD-10-CM

## 2014-10-08 DIAGNOSIS — I10 Essential (primary) hypertension: Secondary | ICD-10-CM

## 2014-10-08 LAB — BASIC METABOLIC PANEL
BUN: 20 mg/dL (ref 6–23)
CO2: 28 mEq/L (ref 19–32)
Calcium: 9.5 mg/dL (ref 8.4–10.5)
Chloride: 106 mEq/L (ref 96–112)
Creatinine, Ser: 1.44 mg/dL — ABNORMAL HIGH (ref 0.40–1.20)
GFR: 37.26 mL/min — ABNORMAL LOW (ref >60.00)
Glucose, Bld: 79 mg/dL (ref 70–99)
Potassium: 3.9 mEq/L (ref 3.5–5.1)
Sodium: 138 mEq/L (ref 135–145)

## 2014-10-08 LAB — HEPATIC FUNCTION PANEL
ALT: 11 U/L (ref 0–35)
AST: 18 U/L (ref 0–37)
Albumin: 4 g/dL (ref 3.5–5.2)
Alkaline Phosphatase: 53 U/L (ref 39–117)
BILIRUBIN TOTAL: 0.6 mg/dL (ref 0.2–1.2)
Bilirubin, Direct: 0.1 mg/dL (ref 0.0–0.3)
Total Protein: 6.7 g/dL (ref 6.0–8.3)

## 2014-10-08 LAB — LIPID PANEL
CHOL/HDL RATIO: 3
Cholesterol: 149 mg/dL (ref 0–200)
HDL: 44 mg/dL (ref >39.00)
LDL Cholesterol: 74 mg/dL (ref 0–99)
NonHDL: 105
Triglycerides: 153 mg/dL — ABNORMAL HIGH (ref 0.0–149.0)
VLDL: 30.6 mg/dL (ref 0.0–40.0)

## 2014-10-08 NOTE — Patient Instructions (Signed)
Medication Instructions:  Your physician recommends that you continue on your current medications as directed. Please refer to the Current Medication list given to you today.   Labwork: TODAY - cholesterol, liver, basic metabolic panel  Your physician recommends that you return for lab work in: 1 year on the day of or a few days before your office visit with Dr. Nahser.  You will need to FAST for this appointment - nothing to eat or drink after midnight the night before except water.   Testing/Procedures: None Ordered   Follow-Up: Your physician wants you to follow-up in: 1 year with Dr. Nahser.  You will receive a reminder letter in the mail two months in advance. If you don't receive a letter, please call our office to schedule the follow-up appointment.     

## 2014-10-15 ENCOUNTER — Emergency Department (HOSPITAL_COMMUNITY)
Admission: EM | Admit: 2014-10-15 | Discharge: 2014-10-15 | Discharge status: Against Medical Advice | Payer: Medicare Other | Attending: Emergency Medicine | Admitting: Emergency Medicine

## 2014-10-15 ENCOUNTER — Encounter (HOSPITAL_COMMUNITY): Payer: Self-pay | Admitting: Emergency Medicine

## 2014-10-15 DIAGNOSIS — Y929 Unspecified place or not applicable: Secondary | ICD-10-CM | POA: Insufficient documentation

## 2014-10-15 DIAGNOSIS — Y939 Activity, unspecified: Secondary | ICD-10-CM | POA: Diagnosis not present

## 2014-10-15 DIAGNOSIS — N189 Chronic kidney disease, unspecified: Secondary | ICD-10-CM | POA: Insufficient documentation

## 2014-10-15 DIAGNOSIS — T17208A Unspecified foreign body in pharynx causing other injury, initial encounter: Secondary | ICD-10-CM | POA: Diagnosis not present

## 2014-10-15 DIAGNOSIS — I129 Hypertensive chronic kidney disease with stage 1 through stage 4 chronic kidney disease, or unspecified chronic kidney disease: Secondary | ICD-10-CM | POA: Diagnosis not present

## 2014-10-15 DIAGNOSIS — Y998 Other external cause status: Secondary | ICD-10-CM | POA: Diagnosis not present

## 2014-10-15 DIAGNOSIS — X58XXXA Exposure to other specified factors, initial encounter: Secondary | ICD-10-CM | POA: Insufficient documentation

## 2014-10-15 NOTE — ED Notes (Signed)
Pt told registration that she had something stuck in her throat and went in the bathroom and coughed it all up.  States she was better and going home.  Pt did not speak with nurse.

## 2014-10-15 NOTE — ED Notes (Signed)
Pt sts pain with swallowing after eating apple today that she thinks is stuck in throat; pt tolerating saliva but unable to keep down liquids and having pain with swallowing

## 2014-11-06 ENCOUNTER — Encounter: Payer: Self-pay | Admitting: Student

## 2014-11-06 ENCOUNTER — Ambulatory Visit (INDEPENDENT_AMBULATORY_CARE_PROVIDER_SITE_OTHER): Payer: Medicare Other | Admitting: Student

## 2014-11-06 VITALS — BP 142/72 | HR 87 | Temp 98.7°F | Ht 61.0 in | Wt 147.0 lb

## 2014-11-06 DIAGNOSIS — Y92009 Unspecified place in unspecified non-institutional (private) residence as the place of occurrence of the external cause: Secondary | ICD-10-CM

## 2014-11-06 DIAGNOSIS — Y92099 Unspecified place in other non-institutional residence as the place of occurrence of the external cause: Secondary | ICD-10-CM | POA: Diagnosis not present

## 2014-11-06 DIAGNOSIS — W19XXXA Unspecified fall, initial encounter: Secondary | ICD-10-CM

## 2014-11-06 DIAGNOSIS — R296 Repeated falls: Secondary | ICD-10-CM | POA: Insufficient documentation

## 2014-11-06 DIAGNOSIS — R52 Pain, unspecified: Secondary | ICD-10-CM | POA: Diagnosis not present

## 2014-11-06 MED ORDER — ACETAMINOPHEN ER 650 MG PO TBCR: 650.0000 mg | EXTENDED_RELEASE_TABLET | Freq: Three times a day (TID) | ORAL | Status: DC | PRN

## 2014-11-06 NOTE — Patient Instructions (Signed)
It is nice to meet you today! After talking and examining you, it is less likely that you have broken a bone. The pain might take sometimes to clear. We will give you a medication you can take at home to ease your pain. We also like you to go down on your Tramadol, which could cause some sedation and lead to fall. We would like to schedule you with your provider, Dr. Cheron SchaumannKarmalegos  for follow up and further plan on fall prevention.   Fall Prevention and Home Safety Falls cause injuries and can affect all age groups. It is possible to use preventive measures to significantly decrease the likelihood of falls. There are many simple measures which can make your home safer and prevent falls. OUTDOORS  Repair cracks and edges of walkways and driveways.  Remove high doorway thresholds.  Trim shrubbery on the main path into your home.  Have good outside lighting.  Clear walkways of tools, rocks, debris, and clutter.  Check that handrails are not broken and are securely fastened. Both sides of steps should have handrails.  Have leaves, snow, and ice cleared regularly.  Use sand or salt on walkways during winter months.  In the garage, clean up grease or oil spills. BATHROOM  Install night lights.  Install grab bars by the toilet and in the tub and shower.  Use non-skid mats or decals in the tub or shower.  Place a plastic non-slip stool in the shower to sit on, if needed.  Keep floors dry and clean up all water on the floor immediately.  Remove soap buildup in the tub or shower on a regular basis.  Secure bath mats with non-slip, double-sided rug tape.  Remove throw rugs and tripping hazards from the floors. BEDROOMS  Install night lights.  Make sure a bedside light is easy to reach.  Do not use oversized bedding.  Keep a telephone by your bedside.  Have a firm chair with side arms to use for getting dressed.  Remove throw rugs and tripping hazards from the  floor. KITCHEN  Keep handles on pots and pans turned toward the center of the stove. Use back burners when possible.  Clean up spills quickly and allow time for drying.  Avoid walking on wet floors.  Avoid hot utensils and knives.  Position shelves so they are not too high or low.  Place commonly used objects within easy reach.  If necessary, use a sturdy step stool with a grab bar when reaching.  Keep electrical cables out of the way.  Do not use floor polish or wax that makes floors slippery. If you must use wax, use non-skid floor wax.  Remove throw rugs and tripping hazards from the floor. STAIRWAYS  Never leave objects on stairs.  Place handrails on both sides of stairways and use them. Fix any loose handrails. Make sure handrails on both sides of the stairways are as long as the stairs.  Check carpeting to make sure it is firmly attached along stairs. Make repairs to worn or loose carpet promptly.  Avoid placing throw rugs at the top or bottom of stairways, or properly secure the rug with carpet tape to prevent slippage. Get rid of throw rugs, if possible.  Have an electrician put in a light switch at the top and bottom of the stairs. OTHER FALL PREVENTION TIPS  Wear low-heel or rubber-soled shoes that are supportive and fit well. Wear closed toe shoes.  When using a stepladder, make sure it is  fully opened and both spreaders are firmly locked. Do not climb a closed stepladder.  Add color or contrast paint or tape to grab bars and handrails in your home. Place contrasting color strips on first and last steps.  Learn and use mobility aids as needed. Install an electrical emergency response system.  Turn on lights to avoid dark areas. Replace light bulbs that burn out immediately. Get light switches that glow.  Arrange furniture to create clear pathways. Keep furniture in the same place.  Firmly attach carpet with non-skid or double-sided tape.  Eliminate uneven  floor surfaces.  Select a carpet pattern that does not visually hide the edge of steps.  Be aware of all pets. OTHER HOME SAFETY TIPS  Set the water temperature for 120 F (48.8 C).  Keep emergency numbers on or near the telephone.  Keep smoke detectors on every level of the home and near sleeping areas. Document Released: 04/08/2002 Document Revised: 10/18/2011 Document Reviewed: 07/08/2011 [facility] Patient Information 2015 [place], [place]. This information is not intended to replace advice given to you by your health care provider. Make sure you discuss any questions you have with your health care provider.

## 2014-11-06 NOTE — Progress Notes (Signed)
   Subjective:    Patient ID: Destiny Moore, female    DOB: 1933-04-19, 81 y.o.   MRN: [id number]  HPI Fall: Larey SeatFell on her back while trying to [employer]hook a shower hose from a wall  6 days ago. Denies any symptoms leading to this. There are no recent medication changes. She couldn't get up has to call her husband for help. Per her husband, patient was mentating okay when he arrived. He was able help her up and get her to bed. She denies heating her head.  Patient normally uses stool in bathroom when she takes shower. She reports not using one that day. She uses walker to get around in the house.   She reports pain and skin bruises over her right back, right arm and left shoulder. She had pain in left shoulder at baseline. She reports the pain is getting better. She denies LOC.   She endorses multiple falls in the past, about 5 times within the last 12 months.  Dizziness: she describes it as lightheadedness. This has been going for over a year but has gotten worse after fall. She denies headache, vision change, chest pain, SOB, dysuria, fever, chills and diarrhea.   Review of Systems As in HPI    Objective:   Physical Exam Ortho Exam  General: appears well-nourished. In NAD. Some difficulty answering questions appropriately.  Oropharynx: MM, wears denture, no lesion Head: atraumatic, normocephalic Neck: supple, full ROM Resp: Good air movement, CTAB CVS: RRR, no murmurs. MSK: tender to palpation over lower border of left scapula, right upper back & right arm posteriorly. No signs of fracture. Full ROM in her neck, can abduct her left arm to 90 degree (chronic per pt). Skin:  6X2 inch dark bruise over right arm posteriorly, 2x2 inch dark bruise over right chest posteriorly and ~2X2 inch yellowish bruise over lower border of left scapula. Neuro: AAOx3, CN II-XII intact, strength 4+/5 in upper arms, 4/5 in left shoulder. Sensation intact in all exts.     Assessment & Plan:

## 2014-11-06 NOTE — Assessment & Plan Note (Addendum)
Fall: likely mechanical given there are no symptoms proceeding this. No recent medication changes although she is on tramadol, hctz and amlodipine that could could cause fall in elderly pt. BP stable which makes orthostatic hypotension less likely. Sustained some bruises on her arm, shoulder and back. Patient hx of fall, about 5 times within the last 12 months. She has some gait issues on exam. Pain improving. No signs of fracture on exam. - Tylenol 650 mg qid prn pain - Follow up with PCP for gait issues and fall prevention - Return to clinic if no improvement or worsening of pain  Dizziness: lightheadedness. This has been going on for long but worse after fall. Unclear cause. She denies head injury upon fall. She is on HCTZ and tramadol but BP has been stable - advised to go down on tramadol  - follow up wth PCP  Mental Status: some difficulty answering questions. No hx of dementia - could benefit from cognitive status eval.

## 2014-11-12 ENCOUNTER — Ambulatory Visit (INDEPENDENT_AMBULATORY_CARE_PROVIDER_SITE_OTHER): Payer: Medicare Other | Admitting: Family Medicine

## 2014-11-12 ENCOUNTER — Encounter: Payer: Self-pay | Admitting: Family Medicine

## 2014-11-12 VITALS — BP 153/82 | HR 90 | Temp 98.8°F | Ht 61.0 in | Wt 145.0 lb

## 2014-11-12 DIAGNOSIS — R32 Unspecified urinary incontinence: Secondary | ICD-10-CM

## 2014-11-12 DIAGNOSIS — M25512 Pain in left shoulder: Secondary | ICD-10-CM | POA: Diagnosis not present

## 2014-11-12 DIAGNOSIS — R296 Repeated falls: Secondary | ICD-10-CM | POA: Diagnosis not present

## 2014-11-12 DIAGNOSIS — R413 Other amnesia: Secondary | ICD-10-CM

## 2014-11-12 NOTE — Patient Instructions (Signed)
Dear Destiny Moore, Thank you for coming in to clinic today. It was good to see you!  1. For your Left shoulder - I think that this is likely an arthritis / bursitis flare worsened by fall. - Take Hydrocodone 1 tablet ever 6 hours as needed for next few days, then take Tylenol 2 extra str every 6 hours as needed for several days. Use heating pad. Range of motion. - May use Tramadol if picked up from pharmacy 2. Call Theresia Boughorma Wilson to discuss home health options may meet with her here at clinic # [phone] (otherwise call main number [phone] and ask for Theresia BoughNorma Wilson)  Schedule "Geriatrics Clinic Appointment - Dr. McDiarmid" for Memory loss, falls, and urinary incontinence - anytime.  Please schedule a follow-up appointment with Dr. Althea CharonKaramalegos in 1 month to follow-up shoulder  If you have any other questions or concerns, please feel free to call the clinic to contact me. You may also schedule an earlier appointment if necessary.  However, if your symptoms get significantly worse, please go to the Emergency Department to seek immediate medical attention.  Destiny PilarAlexander Danil Wedge, DO [facility]

## 2014-11-12 NOTE — Progress Notes (Signed)
   Subjective:    Patient ID: Destiny Moore, female    DOB: 1932-06-03, 81 y.o.   MRN: [id number]  Patient provided history, assistance provided by Daughter Destiny Moore as well.  HPI  RECURRENT FALLS / L SHOULDER - [facility] on 11/06/14 - Describes initial injury fell on 7/1 in shower tub. Reportedly had bruising across back and upper left shoulder posteriorly. She called husband, she doesn't recall what she injured at the time. Denies hitting head. - Today reports persistent Left shoulder pain x 3-4 days started worsening 1-2 days after injury when rest of her pain improved. Worse with lifting arm above head, activities. No prior injury to Left shoulder, surgery.  - Tylenol Ext Str x 2 tabs 1-2x daily for few days (good relief) then stopped unclear why. Does not take Tramadol, unsure. - Requesting Home Health care evaluation today, recurrent falls  MEMORY LOSS: - Last seen 05/2014 for same complaint - Chronic gradual worsening over past months to years. Reported by patients family previously (husband and now sister). Previously discussed possible early dementia. Able to perform most ADLs, functioning well. - Denies any abnormal behavior / mood swings / agitation or violent behavior. No other inappropriate or other cognitive symptoms at this time  I have reviewed and updated the following as appropriate: allergies and current medications  Social Hx: - Lives at home. Daughter Destiny LandsbergRenee available to help, lives in [place]. Currently plans to live with her recently for now.  Review of Systems  See above HPI    Objective:   Physical Exam  BP 153/82 mmHg  Pulse 90  Temp(Src) 98.8 F (37.1 C) (Oral)  Ht 5\' 1"  (1.549 m)  Wt 145 lb (65.772 kg)  BMI 27.41 kg/m2  Gen - elderly frail 81 yr F, otherwise well-appearing, pleasant and good spirits, cooperative, NAD HEENT - NCAT no ecchymosis, oropharynx clear, MMM Neck - supple active FROM, non-tender Chest wall -  non-tender. Small healing ecchymosis over right chest Lungs - CTAB, no wheezing, crackles, or rhonchi. Non-labored. MSK - LUE: mild +TTP posterior shoulder and back over scapula and lateral paraspinal T-spine muscles, mild hypertonicity. Forward flexion mildly limited and abduction notably limited over head due to pain, supraspinatus rotator cuff testing intact strength with moderate pain, negative empty can, positive hawkin's impingement testing. Ext - other than LUE non-tender, no edema, peripheral pulses intact +2 b/l Skin - warm, dry, no rashes Neuro - awake, alert, oriented x3, grossly non-focal, intact muscle strength 4+/5 b/l b/l grip, biceps, intact distal sensation to light touch Psych - congruent mood and affect, normal thoughts and speech, good insight     Assessment & Plan:   See specific A&P problem list for details.

## 2014-11-13 DIAGNOSIS — R32 Unspecified urinary incontinence: Secondary | ICD-10-CM | POA: Insufficient documentation

## 2014-11-13 DIAGNOSIS — M19012 Primary osteoarthritis, left shoulder: Secondary | ICD-10-CM | POA: Insufficient documentation

## 2014-11-13 NOTE — Assessment & Plan Note (Signed)
Recurrent falls over past 1 year, likely multifactorial. Recent fall 7/1 without significant red flags (no LOC, CP/SOB) or major injury, other than continued Left shoulder pain (likely rotator cuff tendinopathy vs bursitis). Less likely orthostatic hypotension (SBP >150s, does have wide pulse pressure though)  Plan: 1. Counseling on fall avoidance and general safety 2. Cautious with sedating meds 3. Contact info for Destiny Moore given for patient/family to arrange CSW eval to determine [facility], [facility] for reducing fall risks

## 2014-11-13 NOTE — Assessment & Plan Note (Signed)
Persistent x 2 weeks without significant worsening since fall 7/1. Clinically decent AROM (limited overhead fflex / abduction), strength intact but pain with supraspinatus testing and impingement. Likely OA in pt with known spine OA. Suspect may be flare bursitis with assoc muscle spasm, may be at risk for frozen shoulder. ?rotator cuff tendinopathy. - inadequate medical therapy so far  Plan: 1. Start trial on Hydrocodone to see if relief, alternatively may try Tramadol (if still has remaining rx) if Norco too sedating. Then take Tylenol regularly. 2. Heating pad, ROM exercises, avoid heavy lifting / overuse 3. RTC 4 weeks, re-eval. Consider imaging, however less likely frx.

## 2014-11-13 NOTE — Assessment & Plan Note (Signed)
Stable, chronic gradual memory loss in elderly pt. No clinical behavioral or mood symptoms of dementia. No loss of function. Performs ADLs.  Plan: 1. Mini-Cog (7/13) score 5/5, less likely dementia on initial screening. 2. Recommended scheduling Geriatrics Clinic Follow-up with Dr. McDiarmid to discuss memory loss / cognitive decline (consider MOCA vs MMSE), recurrent fall eval, also some urinary incontinence

## 2014-11-14 ENCOUNTER — Telehealth: Payer: Self-pay | Admitting: Clinical

## 2014-11-14 ENCOUNTER — Telehealth: Payer: Self-pay | Admitting: Family Medicine

## 2014-11-14 DIAGNOSIS — R296 Repeated falls: Secondary | ICD-10-CM

## 2014-11-14 NOTE — Telephone Encounter (Signed)
See last OV 11/12/14. Briefly, patient's [facility] Norma Wilson as requested. Now placed order for Amb referral [facility] RN for recurrent falls, balance/dizziness gait problems, memory loss, unsafe to leave home without assistance. Theres[facility] Boughorma Wilson [facility], now pending orders.  Saralyn PilarAlexander Winna Golla, DO [facility], PGY-3

## 2014-11-14 NOTE — Telephone Encounter (Signed)
CSW received a call from pts daughter inquiring about home health services. CSW observed from last office visit that pt has had recent frequent falls. CSW provided daughter with education regarding home health. Daughter inquired about the cost of home health and CSW informed her that her insurance would be ran by the home health agency once an order was placed. Pt and daughter agreeable to home health. CSW has informed PCP of request.  Theresia BoughNorma Tehya Leath, MSW, LCSW [phone]

## 2014-11-17 ENCOUNTER — Other Ambulatory Visit: Payer: Self-pay | Admitting: *Deleted

## 2014-11-17 ENCOUNTER — Telehealth: Payer: Self-pay | Admitting: *Deleted

## 2014-11-17 DIAGNOSIS — E785 Hyperlipidemia, unspecified: Secondary | ICD-10-CM

## 2014-11-17 DIAGNOSIS — M79604 Pain in right leg: Secondary | ICD-10-CM

## 2014-11-17 DIAGNOSIS — M79605 Pain in left leg: Secondary | ICD-10-CM

## 2014-11-17 DIAGNOSIS — I10 Essential (primary) hypertension: Secondary | ICD-10-CM

## 2014-11-17 DIAGNOSIS — K219 Gastro-esophageal reflux disease without esophagitis: Secondary | ICD-10-CM

## 2014-11-17 MED ORDER — AMLODIPINE BESYLATE 10 MG PO TABS: 10.0000 mg | ORAL_TABLET | Freq: Every day | ORAL | Status: DC

## 2014-11-17 MED ORDER — HYDROCHLOROTHIAZIDE 12.5 MG PO CAPS: 12.5000 mg | ORAL_CAPSULE | Freq: Every day | ORAL | Status: DC

## 2014-11-17 MED ORDER — PANTOPRAZOLE SODIUM 40 MG PO TBEC: DELAYED_RELEASE_TABLET | ORAL | Status: DC

## 2014-11-17 MED ORDER — GABAPENTIN 100 MG PO CAPS: ORAL_CAPSULE | ORAL | Status: DC

## 2014-11-17 MED ORDER — RANITIDINE HCL 150 MG PO TABS: ORAL_TABLET | ORAL | Status: DC

## 2014-11-17 MED ORDER — SIMVASTATIN 20 MG PO TABS: 20.0000 mg | ORAL_TABLET | Freq: Every day | ORAL | Status: DC

## 2014-11-17 NOTE — Telephone Encounter (Signed)
Heather, RN with [facility] called to report a severe drug-drug interaction between amlodipine and simvastatin at a level 1.  Please give her a call at [phone].  Clovis PuMartin, Tamika L, RN

## 2014-11-19 NOTE — Telephone Encounter (Signed)
[facility] with further details for Mrs. Herma MeringEvelyn Oyster. Basically, [facility] and underwent a medication interaction screening, identified level 1 interaction with amlodipine and simvastatin, which when combined Amlodipine inhibits the metabolism of Simvastatin, which raises the drug level, this can cause muscle side effects or myalgias, worse case scenario rhabdo. Regardless, patient has been tolerating this current drug regimen well without complications for past several years. I have not made changes at this time.  I called and spoke to [facility], Charity fundraiserN and confirmed this above information. She did not need any medication change as long as physician was notified, will consider future switch off of Amlodipine in future as tolerated by BP.  Saralyn PilarAlexander Blessing Zaucha, DO [facility], PGY-3

## 2014-11-21 ENCOUNTER — Telehealth: Payer: Self-pay | Admitting: *Deleted

## 2014-11-21 NOTE — Telephone Encounter (Signed)
Returned call to [employer] at [place]. Briefly reviewed reported recent events for Mrs. Herma Mering. Unclear etiology of symptoms over past weekend. Concern for possible anxiety episode with stress of her daughter leaving. Also had reported poor PO intake prior, however BP elevated seems less likely orthostatic, but may be mild dehydration. Patient since resolved and doing well per reports, will continue to monitor and follow with [facility] agency, next home visit next week. No changes at this time. If BP was low upon future re-check could consider holding HCTZ 12.5mg  daily, avoid dehydration, improve PO intake.  Saralyn Pilar, DO [facility], PGY-3

## 2014-11-21 NOTE — Telephone Encounter (Signed)
Late entry:  Heather, RN with [facility] called to report that on Sunday 11/16/14 patient had an episode of weakness, chest pain and shaking.  It resolved after a while and patient was able to back into church.  Patient did not go to the ED.  Herbert Seta was at the home; patient complained of dizziness that lasted a few minutes and resolved.  Pt felt like she could not think.  Herbert Seta also stated patient's daughter was leaving to go back to her home and was unsure if patient was having some anxiety related to that.  Pt's vital signs were ok.  Herbert Seta had a patient drink water and to eat.  She advised the patient when to contact [facility] and go to the ED.  Next home visit scheduled for next week; either Monday or Tuesday.  Please give her a call with questions [phone].  Clovis Pu, RN

## 2014-11-24 ENCOUNTER — Telehealth: Payer: Self-pay | Admitting: Family Medicine

## 2014-11-24 NOTE — Telephone Encounter (Signed)
PT with [facility]: Has been giving therepy to patient 2ce week for 4 weeks effective 11-17-14

## 2014-11-24 NOTE — Telephone Encounter (Signed)
Reviewed chart. Appreciate update from [facility] No new orders or concerns at this time. Patient does not have known history of CHF. Not on diuretic therapy. No change today.  Saralyn Pilar, DO [facility], PGY-3

## 2014-11-24 NOTE — Telephone Encounter (Signed)
Home health nurse called and wanted the doctor to know that the patient had a 4 lb weight gain since last Thursday. jw

## 2014-12-07 ENCOUNTER — Encounter: Payer: Self-pay | Admitting: Emergency Medicine

## 2014-12-07 ENCOUNTER — Encounter (HOSPITAL_COMMUNITY): Payer: Self-pay | Admitting: *Deleted

## 2014-12-07 ENCOUNTER — Ambulatory Visit (INDEPENDENT_AMBULATORY_CARE_PROVIDER_SITE_OTHER): Payer: Medicare Other | Admitting: Emergency Medicine

## 2014-12-07 ENCOUNTER — Emergency Department (HOSPITAL_COMMUNITY): Payer: Medicare Other

## 2014-12-07 ENCOUNTER — Emergency Department (HOSPITAL_COMMUNITY)
Admission: EM | Admit: 2014-12-07 | Discharge: 2014-12-07 | Disposition: A | Discharge status: Home / Self Care | Payer: Medicare Other | Attending: Emergency Medicine | Admitting: Emergency Medicine

## 2014-12-07 VITALS — BP 150/64 | HR 82 | Resp 22

## 2014-12-07 DIAGNOSIS — I200 Unstable angina: Secondary | ICD-10-CM

## 2014-12-07 DIAGNOSIS — Z87440 Personal history of urinary (tract) infections: Secondary | ICD-10-CM | POA: Insufficient documentation

## 2014-12-07 DIAGNOSIS — N183 Chronic kidney disease, stage 3 (moderate): Secondary | ICD-10-CM | POA: Diagnosis not present

## 2014-12-07 DIAGNOSIS — E785 Hyperlipidemia, unspecified: Secondary | ICD-10-CM | POA: Diagnosis not present

## 2014-12-07 DIAGNOSIS — F329 Major depressive disorder, single episode, unspecified: Secondary | ICD-10-CM | POA: Insufficient documentation

## 2014-12-07 DIAGNOSIS — R0789 Other chest pain: Secondary | ICD-10-CM

## 2014-12-07 DIAGNOSIS — F419 Anxiety disorder, unspecified: Secondary | ICD-10-CM | POA: Insufficient documentation

## 2014-12-07 DIAGNOSIS — E876 Hypokalemia: Secondary | ICD-10-CM

## 2014-12-07 DIAGNOSIS — I129 Hypertensive chronic kidney disease with stage 1 through stage 4 chronic kidney disease, or unspecified chronic kidney disease: Secondary | ICD-10-CM | POA: Diagnosis not present

## 2014-12-07 DIAGNOSIS — R079 Chest pain, unspecified: Secondary | ICD-10-CM

## 2014-12-07 DIAGNOSIS — Z79899 Other long term (current) drug therapy: Secondary | ICD-10-CM | POA: Insufficient documentation

## 2014-12-07 DIAGNOSIS — M79602 Pain in left arm: Secondary | ICD-10-CM | POA: Diagnosis not present

## 2014-12-07 DIAGNOSIS — Z7982 Long term (current) use of aspirin: Secondary | ICD-10-CM | POA: Diagnosis not present

## 2014-12-07 LAB — CBC WITH DIFFERENTIAL/PLATELET
BASOS ABS: 0 10*3/uL (ref 0.0–0.1)
Basophils Relative: 0 % (ref 0–1)
Eosinophils Absolute: 0.1 10*3/uL (ref 0.0–0.7)
Eosinophils Relative: 1 % (ref 0–5)
HCT: 36.6 % (ref 36.0–46.0)
HEMOGLOBIN: 12.3 g/dL (ref 12.0–15.0)
Lymphocytes Relative: 19 % (ref 12–46)
Lymphs Abs: 1 10*3/uL (ref 0.7–4.0)
MCH: 30.3 pg (ref 26.0–34.0)
MCHC: 33.6 g/dL (ref 30.0–36.0)
MCV: 90.1 fL (ref 78.0–100.0)
Monocytes Absolute: 0.5 10*3/uL (ref 0.1–1.0)
Monocytes Relative: 9 % (ref 3–12)
Neutro Abs: 4 10*3/uL (ref 1.7–7.7)
Neutrophils Relative %: 71 % (ref 43–77)
Platelets: 223 10*3/uL (ref 150–400)
RBC: 4.06 MIL/uL (ref 3.87–5.11)
RDW: 13 % (ref 11.5–15.5)
WBC: 5.6 10*3/uL (ref 4.0–10.5)

## 2014-12-07 LAB — I-STAT TROPONIN, ED
Troponin i, poc: 0 ng/mL (ref 0.00–0.08)
Troponin i, poc: 0 ng/mL (ref 0.00–0.08)

## 2014-12-07 LAB — BASIC METABOLIC PANEL
Anion gap: 10 (ref 5–15)
BUN: 13 mg/dL (ref 6–20)
CHLORIDE: 104 mmol/L (ref 101–111)
CO2: 27 mmol/L (ref 22–32)
Calcium: 9.1 mg/dL (ref 8.9–10.3)
Creatinine, Ser: 1.56 mg/dL — ABNORMAL HIGH (ref 0.44–1.00)
GFR calc Af Amer: 35 mL/min — ABNORMAL LOW (ref >60)
GFR calc non Af Amer: 30 mL/min — ABNORMAL LOW (ref >60)
GLUCOSE: 101 mg/dL — AB (ref 65–99)
Potassium: 2.9 mmol/L — ABNORMAL LOW (ref 3.5–5.1)
Sodium: 141 mmol/L (ref 135–145)

## 2014-12-07 MED ORDER — NITROGLYCERIN 0.3 MG SL SUBL
0.4000 mg | SUBLINGUAL_TABLET | SUBLINGUAL | Status: DC | PRN
  Administered 2014-12-07: 0.8 mg via SUBLINGUAL

## 2014-12-07 MED ORDER — SODIUM CHLORIDE 0.9 % IV SOLN
INTRAVENOUS | Status: DC
  Administered 2014-12-07: 15:00:00 via INTRAVENOUS

## 2014-12-07 MED ORDER — ASPIRIN 81 MG PO CHEW
324.0000 mg | CHEWABLE_TABLET | Freq: Once | ORAL | Status: AC
  Administered 2014-12-07: 324 mg via ORAL

## 2014-12-07 MED ORDER — POTASSIUM CHLORIDE ER 10 MEQ PO TBCR: 20.0000 meq | EXTENDED_RELEASE_TABLET | Freq: Every day | ORAL | Status: DC

## 2014-12-07 MED ORDER — POTASSIUM CHLORIDE CRYS ER 20 MEQ PO TBCR
40.0000 meq | EXTENDED_RELEASE_TABLET | Freq: Once | ORAL | Status: AC
  Administered 2014-12-07: 40 meq via ORAL
  Filled 2014-12-07: qty 2

## 2014-12-07 NOTE — ED Notes (Signed)
NAD at this time. Pt is stable and going home.  

## 2014-12-07 NOTE — ED Provider Notes (Signed)
CSN: [id number]     Arrival date & time 12/07/14  1107 History   First MD Initiated Contact with Patient 12/07/14 1112     Chief Complaint  Patient presents with  . Chest Pain     (Consider location/radiation/quality/duration/timing/severity/associated sxs/prior Treatment) Patient is a 81 y.o. female presenting with chest pain. The history is provided by the patient and medical records.  Chest Pain   81 year old female with history of hypertension, hyperlipidemia, anxiety, depression, chronic kidney disease, presenting to the ED for chest pain. Patient fell getting out of her shower approximately 1 month ago onto her left arm and left ribs. She was seen at urgent care, but did not have any x-rays performed. Since this time she has had ongoing left arm and shoulder pain. Today while getting ready for church she had worsening left arm pain with some radiation into the chest. Urgent care reported shortness of breath and diaphoresis, however patient denies this to me. Patient does not have known cardiac history, she frequently sees cardiology, Dr. Elease Hashimoto.  She has never been a smoker. She denies any recent cough, nasal congestion, rhinorrhea, sore throat, or other upper respiratory symptoms.  VSS.  Past Medical History  Diagnosis Date  . Hypertension   . Hyperlipidemia   . Anxiety   . UTI (urinary tract infection)   . Depression   . Osteoporosis   . Allergy   . CKD (chronic kidney disease)     stage 3 as of 04/2014.    Past Surgical History  Procedure Laterality Date  . Hystrectomy    . Bladder tact    . Abdominal hysterectomy    . Incontinence surgery    . Cholecystectomy N/A 05/08/2014    Procedure: LAPAROSCOPIC CHOLECYSTECTOMY WITH INTRAOPERATIVE CHOLANGIOGRAM;  Surgeon: Manus Rudd, MD;  Location: [facility] OR;  Service: General;  Laterality: N/A;   Family History  Problem Relation Age of Onset  . Heart disease Mother   . Cancer Sister     breast  . Cancer Brother     skin, kidney,  lung   History  Substance Use Topics  . Smoking status: Never Smoker   . Smokeless tobacco: Never Used  . Alcohol Use: No   OB History    No data available     Review of Systems  Cardiovascular: Positive for chest pain.  Musculoskeletal: Positive for arthralgias.  All other systems reviewed and are negative.     Allergies  Coreg; Lisinopril; Sertraline hcl; and Carvedilol  Home Medications   Prior to Admission medications   Medication Sig Start Date End Date Taking? Authorizing Provider  acetaminophen (TYLENOL 8 HOUR) 650 MG CR tablet Take 1 tablet (650 mg total) by mouth every 8 (eight) hours as needed for pain. Patient not taking: Reported on 12/07/2014 11/06/14   Almon Hercules, MD  alendronate (FOSAMAX) 70 MG tablet TAKE 1 TABLET EVERY 7 DAYS WITH A FULL GLASS OF WATER ON AN EMPTY STOMACH 09/18/14   Smitty Cords, DO  amLODipine (NORVASC) 10 MG tablet Take 1 tablet (10 mg total) by mouth daily. 11/17/14   Smitty Cords, DO  aspirin 81 MG tablet Take 81 mg by mouth daily.    Historical Provider, MD  B Complex-C (B-COMPLEX WITH VITAMIN C) tablet Take 1 tablet by mouth daily.    Historical Provider, MD  DUREZOL 0.05 % EMUL Place 1 drop into the right eye daily.  03/11/14   Historical Provider, MD  EPINEPHrine (EPIPEN) 0.3 mg/0.3 mL DEVI  Inject 0.3 mg into the muscle as needed (for allergic reaction).     Historical Provider, MD  gabapentin (NEURONTIN) 100 MG capsule TAKE 3 CAPSULES BY MOUTH EVERY DAY AT BEDTIME 11/17/14   Smitty Cords, DO  hydrochlorothiazide (MICROZIDE) 12.5 MG capsule Take 1 capsule (12.5 mg total) by mouth daily. 11/17/14   Smitty Cords, DO  HYDROcodone-acetaminophen (NORCO/VICODIN) 5-325 MG per tablet Take 1 tablet by mouth every 8 (eight) hours as needed for moderate pain. Patient not taking: Reported on 11/13/2014 08/01/14   Smitty Cords, DO  loperamide (IMODIUM) 2 MG capsule Take 1 capsule (2 mg total) by mouth as  needed for diarrhea or loose stools. 02/27/14   Shon Baton, MD  ondansetron (ZOFRAN) 4 MG tablet Take 1 tablet (4 mg total) by mouth every 8 (eight) hours as needed for nausea or vomiting. 02/27/14   Shon Baton, MD  pantoprazole (PROTONIX) 40 MG tablet TAKE 1 TABLET BY MOUTH DAILY BEFORE BREAKFAST. 11/17/14   Smitty Cords, DO  ranitidine (ZANTAC) 150 MG tablet TAKE 1 TABLET (150 MG TOTAL) BY MOUTH 2 (TWO) TIMES DAILY. 11/17/14   Smitty Cords, DO  simvastatin (ZOCOR) 20 MG tablet Take 1 tablet (20 mg total) by mouth daily. 11/17/14   Smitty Cords, DO  traMADol (ULTRAM) 50 MG tablet Take by mouth every 6 (six) hours as needed.    Historical Provider, MD   BP 129/58 mmHg  Pulse 71  Temp(Src) 98 F (36.7 C) (Oral)  Resp 18  Ht 5' 2.5" (1.588 m)  Wt 140 lb (63.504 kg)  BMI 25.18 kg/m2  SpO2 96%   Physical Exam  Constitutional: She is oriented to person, place, and time. She appears well-developed and well-nourished.  HENT:  Head: Normocephalic and atraumatic.  Mouth/Throat: Oropharynx is clear and moist.  Eyes: Conjunctivae and EOM are normal. Pupils are equal, round, and reactive to light.  Neck: Normal range of motion.  Cardiovascular: Normal rate, regular rhythm and normal heart sounds.   Pulmonary/Chest: Effort normal and breath sounds normal. She has no wheezes. She has no rhonchi. She exhibits no tenderness and no bony tenderness.  No bony tenderness, bruising, deformity, or flail segment; lungs clear bilaterally  Abdominal: Soft. Bowel sounds are normal.  Musculoskeletal: Normal range of motion.  Tenderness of left mid humeral shaft without acute deformity or bruising Crepitus noted with left shoulder, no signs of dislocation, no bony deformities Left arm is neurovascularly intact, compartments soft and easily compressible  Neurological: She is alert and oriented to person, place, and time.  Skin: Skin is warm and dry.  Psychiatric: She  has a normal mood and affect.  Nursing note and vitals reviewed.   ED Course  Procedures (including critical care time) Labs Review Labs Reviewed  BASIC METABOLIC PANEL - Abnormal; Notable for the following:    Potassium 2.9 (*)    Glucose, Bld 101 (*)    Creatinine, Ser 1.56 (*)    GFR calc non Af Amer 30 (*)    GFR calc Af Amer 35 (*)    All other components within normal limits  CBC WITH DIFFERENTIAL/PLATELET  Rosezena Sensor, ED  Rosezena Sensor, ED    Imaging Review Dg Chest 2 View  12/07/2014   CLINICAL DATA:  81 year old female with a history of left-sided chest pain.  History of fall 1 month prior  EXAM: CHEST - 2 VIEW  COMPARISON:  Plain film 09/01/2014, 04/08/2014  FINDINGS: Cardiomediastinal silhouette unchanged in  size and contour.  Atherosclerotic calcifications of the aortic arch.  Interval development of interstitial opacities at the left base.  No confluent airspace disease.  No pleural effusion or pneumothorax.  No displaced fracture.  Potential callus formation of inferior left tenth rib.  Unremarkable appearance of the upper abdomen.  IMPRESSION: New interstitial opacities at the left base, concerning for developing infection given the history of left-sided chest pain. Correlation with pleurisy may be useful, as well as lab values.  There appears to be callus formation at the inferior left tenth rib, compatible with healing rib fracture.  Signed,  Yvone Neu. Loreta Ave, DO  [facility]  [facility]   Electronically Signed   By: Gilmer Mor D.O.   On: 12/07/2014 12:44   Ct Chest Wo Contrast  12/07/2014   CLINICAL DATA:  Left-sided chest pain. Shortness of breath. Possible healing rib fracture. Some interstitial type densities noted on the current chest radiograph.  EXAM: CT CHEST WITHOUT CONTRAST  TECHNIQUE: Multidetector CT imaging of the chest was performed following the standard protocol without IV contrast.  COMPARISON:   Current chest radiograph and older exams.  FINDINGS: Thoracic inlet: 2.9 cm hypo attenuating nodule enlarges the right thyroid lobe. No neck base masses. No adenopathy. No axillary adenopathy.  Mediastinum and hila: Heart normal in size and configuration. There are moderate coronary artery calcifications. Great vessels are normal in caliber. Mildly prominent mediastinal lymph nodes, none pathologically enlarged by size criteria. No mediastinal or hilar masses. No hilar adenopathy.  Lungs and pleura: Mild right middle lobe reticular nodular densities, likely scarring. No lung masses or suspicious nodules. No lung consolidation or edema. Small calcification in the posterior left lower lobe. Minor left upper lobe lingular subsegmental atelectasis or scarring the anterior lung base. No pleural effusion. No pneumothorax.  Limited upper abdomen: Bilateral renal lobulation and areas of cortical thinning. Status post cholecystectomy. Mild chronic common bile duct dilation.  Musculoskeletal: Minor degenerative changes of the thoracic spine. No osteoblastic or osteolytic lesions. No rib fractures.  IMPRESSION: 1. No acute findings.  No evidence of pneumonia. 2. No rib fracture seen. 3. 2.9 cm hypo attenuating right thyroid nodule. Recommend follow-up thyroid ultrasound if this has not been previously performed   Electronically Signed   By: Amie Portland M.D.   On: 12/07/2014 14:35   Dg Shoulder Left  12/07/2014   CLINICAL DATA:  Per EMS they were called to the [facility] for left sided chest pain on the pt. Pt fell a month ago on her left side. She never was treated or seen for the fall. She has had left sided arm and back pain since then. She has also had a slight decline in cognition. Yesterday while doing house work the pt began to have Left side chest pain, she went to her [facility] today for follow up, they sent her to the ED. Pts pain goes up her left arm and shoulder into her back and is at a 4/10 pain level  EXAM: LEFT SHOULDER  - 2+ VIEW  COMPARISON:  None.  FINDINGS: No fracture or dislocation. There are moderate AC joint osteoarthritic changes. The glenohumeral joint is well preserved. Soft tissues are unremarkable.  IMPRESSION: No fracture or dislocation.  Moderate AC joint osteoarthritis.   Electronically Signed   By: Amie Portland M.D.   On: 12/07/2014 12:41   Dg Humerus Left  12/07/2014   CLINICAL DATA:  Chest pain radiating into the left shoulder and arm for 1  month. Patient fell 1 month ago. Initial encounter.  EXAM: LEFT HUMERUS - 2+ VIEW  COMPARISON:  None.  FINDINGS: The mineralization and alignment are normal. There is no evidence of acute fracture or dislocation. Moderate acromioclavicular degenerative changes are noted.  IMPRESSION: No acute osseous findings.   Electronically Signed   By: Carey Bullocks M.D.   On: 12/07/2014 12:41     EKG Interpretation   Date/Time:  Sunday December 07 2014 11:08:24 EDT Ventricular Rate:  63 PR Interval:  167 QRS Duration: 93 QT Interval:  433 QTC Calculation: 443 R Axis:   9 Text Interpretation:  Sinus rhythm Atrial premature complex Normal tracing  No significant change since last tracing Confirmed by KNOTT MD, Reuel Boom  ([id number]) on 12/07/2014 12:51:52 PM      MDM   Final diagnoses:  Chest pain  Left arm pain  Hypokalemia   81 year old female here with left arm pain x1 month after a fall, now with episode of chest pain this morning while getting dressed for church. Urgent care report shortness of breath and diaphoresis, however patient denies this to me. Her vital signs are stable and she is in no acute distress. EKG sinus rhythm, no ischemic changes noted. Lab work notable for hypokalemia which was replaced here in the ED. Creatinine slightly bumped from previous, but still appears close to baseline.  Imaging negative for acute fractures of left humerus or shoulder.  CXR with new opacities at left base, questionable infection. Also noted callus formation at left 10th  rib with suspicion of healing humerus rib fracture. Initial plan was to assess further with CTA chest, however due to GFR could not have IV contrast.  CT chest w/o contrast was obtained-- no evidence of acute pneumonia or acute rib fracture. Upon further chart review, patient was seen by cardiology 2 months ago-- she has long-standing history of atypical, noncardiac chest pain. She has no history of coronary artery disease or MI.  Will plan for delta troponin and if negative patient will be discharged home with PCP follow-up.  3:49 PM Delta trop negative.  VS remain stable-- no tachycardia or hypoxia.  Patient has hx of atypical, non-cardiac chest pain.  No hx of CAD or MI.  Given negative work-up here today, lower suspicion for ACS, PE, dissection, or other acute cardiac event at this time.  Patient d/c home with close PCP FU.  K+ supplementation given for the next few days.  Discussed plan with patient, he/she acknowledged understanding and agreed with plan of care.  Return precautions given for new or worsening symptoms.  Case discussed with attending physician, Dr. Clydene Pugh, who evaluated patient and agrees with assessment and plan of care.  Garlon Hatchet, PA-C 12/07/14 1551  Lyndal Pulley, MD 12/07/14 [phone]

## 2014-12-07 NOTE — Patient Instructions (Signed)

## 2014-12-07 NOTE — ED Provider Notes (Addendum)
Medical screening examination/treatment/procedure(s) were conducted as a shared visit with non-physician practitioner(s) and myself.  I personally evaluated the patient during the encounter.   EKG Interpretation   Date/Time:   year old female with left-sided chest pain, has history of recent fall, new area of consolidation noted on the chest x-ray, nontender to palpation over left chest. CT scan to evaluate chest, assess for pulmonary contusion or evidence of infection. No recent fevers or productive cough.  See related encounter note   Lyndal Pulley, MD 12/07/14 1257  Lyndal Pulley, MD 12/07/14 [phone]

## 2014-12-07 NOTE — Discharge Instructions (Signed)
Continue your regular home medications as directed. Follow-up with your primary care physician. Return here for any new/worsening symptoms.

## 2014-12-07 NOTE — ED Notes (Signed)
Per EMS they were called to the [facility] for left sided chest pain on the pt.  Pt fell a month ago on her left side.  She never was treated or seen for the fall. She has had left sided arm and back pain since then. She has also had a slight decline in cognition. Yesterday while doing house work the pt began to have Left side chest pain, she went to her [facility] today for follow up, they sent her to the ED.  Pts pain goes up her left arm and shoulder into her back and is at a 4/10 pain level. Pt was given  of ASA and 2 nitro at [facility]. VS per EMS are as follows: BP: 120/4 HR:66 SPO2 97% on RA

## 2014-12-07 NOTE — Progress Notes (Signed)
Subjective:  Patient ID: Destiny Moore, female    DOB: 12-19-1932  Age: 81 y.o. MRN: [id number]  CC: No chief complaint on file.   HPI Destiny Moore presents  with chest pain radiating to her left arm. She fell one month ago and since then had pain in her right shoulder and a week after her fall she developed pain in her left shoulder  She went to the doctor for several times and he failed to radiograph any of the injuries.  Today she developed a new and distinctly different pain in her left anterior chest radiating down her left arm associated with diaphoresis and a sensation of shortness of breath. She had no nausea or vomiting. No sensation of a rapid or irregular heartbeat. She had no nausea or vomiting no dizziness.  Her husband states that since her fall she's had an altered mental status but that's not been evaluated by family doctor either  History Destiny Moore has a past medical history of Hypertension; Hyperlipidemia; Anxiety; UTI (urinary tract infection); Depression; Osteoporosis; Allergy; and CKD (chronic kidney disease).   She has past surgical history that includes hystrectomy; bladder tact; Abdominal hysterectomy; Incontinence surgery; and Cholecystectomy (N/A, 05/08/2014).   Her  family history includes Cancer in her brother and sister; Heart disease in her mother.  She   reports that she has never smoked. She has never used smokeless tobacco. She reports that she does not drink alcohol or use illicit drugs.  Outpatient Prescriptions Prior to Visit  Medication Sig Dispense Refill  . acetaminophen (TYLENOL 8 HOUR) 650 MG CR tablet Take 1 tablet (650 mg total) by mouth every 8 (eight) hours as needed for pain. 14 tablet 0  . alendronate (FOSAMAX) 70 MG tablet TAKE 1 TABLET EVERY 7 DAYS WITH A FULL GLASS OF WATER ON AN EMPTY STOMACH 4 tablet 11  . amLODipine (NORVASC) 10 MG tablet Take 1 tablet (10 mg total) by mouth daily. 90 tablet 3  . aspirin 81 MG tablet Take 81 mg  by mouth daily.    . B Complex-C (B-COMPLEX WITH VITAMIN C) tablet Take 1 tablet by mouth daily.    . DUREZOL 0.05 % EMUL Place 1 drop into the right eye daily.   0  . EPINEPHrine (EPIPEN) 0.3 mg/0.3 mL DEVI Inject 0.3 mg into the muscle as needed (for allergic reaction).     . gabapentin (NEURONTIN) 100 MG capsule TAKE 3 CAPSULES BY MOUTH EVERY DAY AT BEDTIME 90 capsule 3  . hydrochlorothiazide (MICROZIDE) 12.5 MG capsule Take 1 capsule (12.5 mg total) by mouth daily. 90 capsule 3  . HYDROcodone-acetaminophen (NORCO/VICODIN) 5-325 MG per tablet Take 1 tablet by mouth every 8 (eight) hours as needed for moderate pain. (Patient not taking: Reported on 11/13/2014) 30 tablet 0  . loperamide (IMODIUM) 2 MG capsule Take 1 capsule (2 mg total) by mouth as needed for diarrhea or loose stools. 30 capsule 0  . ondansetron (ZOFRAN) 4 MG tablet Take 1 tablet (4 mg total) by mouth every 8 (eight) hours as needed for nausea or vomiting. 20 tablet 0  . pantoprazole (PROTONIX) 40 MG tablet TAKE 1 TABLET BY MOUTH DAILY BEFORE BREAKFAST. 90 tablet 3  . ranitidine (ZANTAC) 150 MG tablet TAKE 1 TABLET (150 MG TOTAL) BY MOUTH 2 (TWO) TIMES DAILY. 60 tablet 1  . simvastatin (ZOCOR) 20 MG tablet Take 1 tablet (20 mg total) by mouth daily. 30 tablet 5  . traMADol (ULTRAM) 50 MG tablet Take by mouth  every 6 (six) hours as needed.     No facility-administered medications prior to visit.    History   Social History  . Marital Status: Married    Spouse Name: BEATRYCE COLOMBO  . Number of Children: 2  . Years of Education: 10   Occupational History  . Retired- Emergency planning/management officer    Social History Main Topics  . Smoking status: Never Smoker   . Smokeless tobacco: Never Used  . Alcohol Use: No  . Drug Use: No  . Sexual Activity: Not on file   Other Topics Concern  . Not on file   Social History Narrative   Health Care POA:    Emergency Contact: Garnette Gunner [phone] (h)    End of Life Plan:    Who  lives with you: Lives with husband,  step son Ricka Westra   Any pets: 4 cats   Diet: Patient has a varied diet of protein, starch, and vegetables.    Exercise: Patient does not have any regular exercise plan.  Does not like to exercise by her self.   Seatbelts: Patient reports wearing seat belt when in vehicle.   Sun Exposure/Protection: Patient does not wear sun protection.   Hobbies: cooking, traveling, visiting family in [place]                  Review of Systems  Constitutional: Negative for fever, chills and appetite change.  HENT: Negative for congestion, ear pain, postnasal drip, sinus pressure and sore throat.   Eyes: Negative for pain and redness.  Respiratory: Positive for chest tightness and shortness of breath. Negative for cough and wheezing.   Cardiovascular: Positive for chest pain. Negative for leg swelling.  Gastrointestinal: Negative for nausea, vomiting, abdominal pain, diarrhea, constipation and blood in stool.  Endocrine: Negative for polyuria.  Genitourinary: Negative for dysuria, urgency, frequency and flank pain.  Musculoskeletal: Negative for gait problem.  Skin: Negative for rash.  Neurological: Negative for weakness and headaches.  Psychiatric/Behavioral: Negative for confusion and decreased concentration. The patient is not nervous/anxious.     Objective:  There were no vitals taken for this visit.  Physical Exam  Constitutional: She appears well-developed and well-nourished. No distress.  HENT:  Head: Normocephalic and atraumatic.  Right Ear: External ear normal.  Left Ear: External ear normal.  Nose: Nose normal.  Eyes: Conjunctivae and EOM are normal. Pupils are equal, round, and reactive to light. No scleral icterus.  Neck: Normal range of motion. Neck supple. No tracheal deviation present.  Cardiovascular: Normal rate, regular rhythm and normal heart sounds.   Pulmonary/Chest: Effort normal. No respiratory distress. She has no wheezes.  She has no rales.  Abdominal: She exhibits no mass. There is no tenderness. There is no rebound and no guarding.  Musculoskeletal: She exhibits no edema.  Lymphadenopathy:    She has no cervical adenopathy.  Neurological: She is alert. She is disoriented. Coordination normal.  Skin: Skin is warm and dry. No rash noted.  Psychiatric: She has a normal mood and affect. Her behavior is normal.      Assessment & Plan:   Diagnoses and all orders for this visit:  Other chest pain Orders: -     EKG 12-Lead   I am having Destiny Moore maintain her EPINEPHrine, B-complex with vitamin C, aspirin, loperamide, ondansetron, DUREZOL, HYDROcodone-acetaminophen, alendronate, traMADol, acetaminophen, amLODipine, gabapentin, hydrochlorothiazide, pantoprazole, ranitidine, and simvastatin.  No orders of the defined types were placed in this encounter.  Her electrocardiogram was unremarkable for acute change. She was catheterized with an IV and after which 2 doses of nitroglycerin were administered along with a dose of 324 mg of aspirin. Her pain was diminished dramatically with nitroglycerin she was put on oxygen and arrangements were made to transfer her: Hospital by ambulance she remained hemodynamically stable in the office and was transferred with no known complication  Appropriate red flag conditions were discussed with the patient as well as actions that should be taken.  Patient expressed his understanding.  Follow-up: No Follow-up on file.  Carmelina Dane, MD

## 2014-12-08 ENCOUNTER — Telehealth: Payer: Self-pay | Admitting: Family Medicine

## 2014-12-08 NOTE — Telephone Encounter (Signed)
Home health nurse from [place] called and would like to speak to one of the nurses for Dr. Althea Charon. The patient went to the ER over the weekend and today the patient has a lot of tightening of her chest. Please call her at [phone]. Myriam Jacobson

## 2014-12-09 ENCOUNTER — Ambulatory Visit (INDEPENDENT_AMBULATORY_CARE_PROVIDER_SITE_OTHER): Payer: Medicare Other | Admitting: Family Medicine

## 2014-12-09 ENCOUNTER — Encounter: Payer: Self-pay | Admitting: Family Medicine

## 2014-12-09 VITALS — BP 140/64 | HR 73 | Temp 98.1°F | Ht 62.5 in | Wt 146.7 lb

## 2014-12-09 DIAGNOSIS — E042 Nontoxic multinodular goiter: Secondary | ICD-10-CM

## 2014-12-09 DIAGNOSIS — N179 Acute kidney failure, unspecified: Secondary | ICD-10-CM | POA: Diagnosis not present

## 2014-12-09 DIAGNOSIS — N183 Chronic kidney disease, stage 3 unspecified: Secondary | ICD-10-CM

## 2014-12-09 DIAGNOSIS — R0789 Other chest pain: Secondary | ICD-10-CM | POA: Diagnosis not present

## 2014-12-09 DIAGNOSIS — E876 Hypokalemia: Secondary | ICD-10-CM | POA: Insufficient documentation

## 2014-12-09 DIAGNOSIS — M19012 Primary osteoarthritis, left shoulder: Secondary | ICD-10-CM | POA: Diagnosis not present

## 2014-12-09 NOTE — Assessment & Plan Note (Signed)
HypoK to 2.9 (8/7) in ED. Given K repletion in ED, rx K-dur x 2 tabs daily for several days. Tolerating well.  Plan: 1. Complete course PO K as rx by ED 2. Re-check BMET 1 week 3. Caution with AoCKD and chronic K supplement

## 2014-12-09 NOTE — Assessment & Plan Note (Addendum)
Consistent with AoCKD-stage III, GFR approx >30. Clinically euvolemic without obvious sign of dehydration today. Tolerating PO well. Voiding well. - Last SCr 1.56 (up from 1.44, 10/2014)  Plan: 1. Continue improved PO hydration 2. Avoid all NSAIDs - may take Tylenol (as advised, and infrequently may use Hydrocodone PRN), note not on ACE/ARB 3. Return criteria given 4. Ordered future BMET w/ GFR, to check in approx 1 week, schedule Lab Only apt, then follow-up with me within 1 week after to review results

## 2014-12-09 NOTE — Assessment & Plan Note (Signed)
Chronic problem, initial dx 2008 confirmed on Neck US (3 nodules, solid with cystic component, largest R-thyroid 2.7 cm) - Reportedly aspirated 2008, negative malignancy (do not see this report in Epic) - Update with incidental find on Chest CTA 12/07/14, confirms R-thyroid nodule up to 2.9 cm. Without significant change  Plan: 1. Follow-up, consider future repeat neck US for monitoring 2. Unlikely to be malignant, will revisit this discussion with patient at future follow-up

## 2014-12-09 NOTE — Telephone Encounter (Signed)
Spoke with patient directly, states she has still been experiencing some on/off chest tightness since ED visit. Appointment scheduled with PCP today at 2pm.

## 2014-12-09 NOTE — Progress Notes (Signed)
Subjective:    Patient ID: Destiny Moore, female    DOB: 1932-09-03, 81 y.o.   MRN: [id number]  Patient provided history, assistance provided by Daughter [facility]).  HPI   CHEST PAIN, acute on chronic: - Chronic known history of chest pain. Followed by Cardiology [facility] - Dr. Elease Hashimoto) last seen 10/07/14 for annual follow-up, previously documented history of non-cardiac chest pain, no history of CAD or MI, last stress test 2001 (normal myoview) - Recent episode seen in ED 12/07/14 for chest pain and SOB acute episode while getting ready for church. She initially went to [facility] on 8/7 for these complaints, initially associated with diaphoresis and left arm pain (from fall July 2016), given ASA , NTG which improved CP, placed on O2, transferred to ED. In ED, had extensive lab work-up and imaging. Including elevated SCr to 1.5 (near baseline 1.3-1.4), hypokalemia 2.9, given potassium repletion, imaging with EKG (sinus without acute ST-T ischemic changes), CXR initially concerning for possible PNA with possible 10th rib fracture, also X-rays Left Humerus / Shoulder from prior fall (negative for fracture, noted moderate AC joint DJD), proceeded with Chest CTA (without contrast) negative PE, no evidence pneumonia, and no evidence rib fracture. Delta troponin negative. No O2 requirement, stable vitals. Resolved CP in ED. Discharged to home, given K-dur PO for several days. - Today patient reports CP is resolved. Currently no CP or SOB. No concerns. States that this is a similar episode she has experienced before. Thinks that it may have been related to persistent pain in her Left arm / shoulder. - Denies diaphoresis, n/v, weakness, pre-syncope/syncope, active CP, SOB, fevers/chills, productive cough  L SHOULDER / ELBOW PAIN, s/p Fall 10/31/14 - Last seen at [facility] for same complaint on 11/06/14 and 11/12/14, with initial fall injury on 10/31/14 - Recent ED visit 12/07/14 with Left UE X-rays (Shoulder and Humerus, no  evidence acute fracture, but noted moderate DJD in left shoulder) - Today reports improved pain in Left shoulder. Now only sore sometimes with activity. Some persistent pain in left elbow and forearm. Bruising healing. No new swelling or symptoms. - Taking occasional Tylenol. Not taking Hydrocodone. Not taking previous rx Tramadol - Continues with Home Health RN and PT - Using cane for ambulation - No recent falls - Good family support  HYPOKALEMIA / ACUTE ON CHRONIC KIDNEY INJURY: - Last seen ED 8/7 with labs and dx - Continues to improve PO hydration and now with subsequent increased UOP - Taking Potassium pills, was taking 1 twice daily - Aware she is not able to take Ibuprofen / Motrin / Aleve due to kidneys - Denies any urinary retention, burning, hematuria, flank pain  THYROID NODULE: - Incidental finding on Chest CTA in ED 8/7, read as 2.9cm hypo-attenuating R-thyroid nodule. Chart review with previous imaging Neck US 2008 with confirmed 3 solid nodules with internal cystic components, consistent with same finding with largest Right thyroid nodule up to 2.7 cm, suggesting this is approximately unchanged. - Discussed finding with patient. She recalls having this US done in 2008, and stated they did a needle aspiration biopsy of the nodule, and tested it. Reportedly tests were normal. She was told "no cancer".  I have reviewed and updated the following as appropriate: allergies and current medications  Social Hx: - Lives at home with good family support. Continues HH therapy  Review of Systems  See above HPI    Objective:   Physical Exam  BP 140/64 mmHg  Pulse 73  Temp(Src) 98.1  F (36.7 C) (Oral)  Ht 5' 2.5" (1.588 m)  Wt 146 lb 11.2 oz (66.543 kg)  BMI 26.39 kg/m2  Gen - elderly frail 81 yr F, otherwise well-appearing, comfortable, NAD HEENT - oropharynx clear, MMM Neck - supple active FROM, non-tender Chest wall - non-tender Lungs - CTAB, no wheezing, crackles, or  rhonchi. Non-labored. Good air movement. MSK - LUE: resolved anterior shoulder tenderness, with mild +TTP lateral paraspinal T-spine muscles upper back. Left shoulder ROM significantly improved with good forward flexion head (mildly limited above head), supraspinatus rotator cuff testing intact strength with improved pain. Ext - other than LUE non-tender, no edema, peripheral pulses intact +2 b/l Skin - warm, dry, no rashes Neuro - awake, alert, oriented x3, grossly non-focal, intact muscle strength 4/5 on left grip with 5/5 right grip,, biceps, intact distal sensation to light touch     Assessment & Plan:   See specific A&P problem list for details.

## 2014-12-09 NOTE — Assessment & Plan Note (Signed)
Asymptomatic currently. No active CP or SOB. Recent work-up in ED 8/7 without remarkable findings for CP, considered possibly atypical non-cardiac, chest wall / pleurisy pain, vs related to Left arm DJD / recent fall. ED work-up with negative delta trop, EKG, CXR, Chest CTA (w/o contrast), unlikely PE, dissection, consolidation/PNA, ruled out rib fracture. - Followed by Dr. Elease Hashimoto (last visit 10/2014), stable. History of non-cardiac CP chronically, last stress test myoview 2001 negative.  Plan: 1. Reassurance, likely non-cardiac 2. Improve analgesia with Tylenol for Left arm pain 3. Follow-up sooner with Cardiology (was advised 1 yr return) if recurrent CP / SOB events, otherwise given return criteria to ED for acute symptoms.

## 2014-12-09 NOTE — Assessment & Plan Note (Signed)
Improved pain, still mild reduced active ROM. Known Left shoulder DJD (confirmed with recent X-ray, moderate AC DJD), likely flare from recent fall with direct trauma / injury about 1 month ago. Less likely rotator cuff with improved ROM and symptoms. - Not taking previously rx pain medicine - Last x-ray: Left shoulder and humerus 8/7, no fractures  Plan: 1. Emphasized importance of regular Tylenol use for at least 2-4 weeks,  TID regularly 2. May take previous Hydrocodone for breakthrough 1-2x daily, caution sedation, fall risk 3. Discontinued Tramadol - no longer taking 4. Re-emphasized ROM exercises as demonstrated, heating pad, avoid overuse 5. Continue HH PT 6. RTC 1-3 months re-evaluation

## 2014-12-09 NOTE — Patient Instructions (Addendum)
Dear Destiny Moore, Thank you for coming in to clinic today. It was good to see you!  1. Chest pain is improving - heart checked out in ED and no evidence of cardiac chest pain 2. Left shoulder and elbow pain is most likely from bruising and joint injury from fall. You have known Arthritis in Shoulder worsening your pain - Take extra strength Tylenol  (2 tablets per dose) up to 3 times a day every 6-8 hours (maximum 6 tablets in 24 hours) - May take Hydrocodone as needed 1-2x daily for pain 3. Finish your Potassium medicine 2 tablets daily until finished  If you develop more episodes of chest pain that resolve and you return to normal, you may call Cardiology (Dr. Elease Hashimoto) for a follow-up appointment (last seen in June 2016) Address: [address], [place], [place] [address] Phone: [phone]  Schedule "Geriatrics Clinic Appointment - Dr. McDiarmid" for Memory loss, falls, and urinary incontinence - anytime.  Please schedule "Lab Only appointment" in 1 week, then schedule a follow-up appointment with Dr. Althea Charon about 3-5 days after this lab visit to follow-up labs - Potassium, Kidneys  If you have any other questions or concerns, please feel free to call the clinic to contact me. You may also schedule an earlier appointment if necessary.  However, if your symptoms get significantly worse, please go to the Emergency Department to seek immediate medical attention.  Saralyn Pilar, DO [facility]

## 2014-12-15 ENCOUNTER — Other Ambulatory Visit: Payer: Medicare Other

## 2014-12-15 DIAGNOSIS — N183 Chronic kidney disease, stage 3 (moderate): Principal | ICD-10-CM

## 2014-12-15 DIAGNOSIS — N179 Acute kidney failure, unspecified: Secondary | ICD-10-CM

## 2014-12-15 DIAGNOSIS — E876 Hypokalemia: Secondary | ICD-10-CM

## 2014-12-15 LAB — BASIC METABOLIC PANEL WITH GFR
BUN: 18 mg/dL (ref 7–25)
CHLORIDE: 104 mmol/L (ref 98–110)
CO2: 27 mmol/L (ref 20–31)
Calcium: 10 mg/dL (ref 8.6–10.4)
Creat: 1.56 mg/dL — ABNORMAL HIGH (ref 0.60–0.93)
GFR, EST NON AFRICAN AMERICAN: 31 mL/min — AB (ref >=60)
GFR, Est African American: 36 mL/min — ABNORMAL LOW (ref >=60)
Glucose, Bld: 92 mg/dL (ref 65–99)
Potassium: 4.1 mmol/L (ref 3.5–5.3)
SODIUM: 145 mmol/L (ref 135–146)

## 2014-12-15 NOTE — Progress Notes (Signed)
BMP DONE TODAY Saory Carriero 

## 2014-12-16 ENCOUNTER — Other Ambulatory Visit: Payer: Medicare Other

## 2014-12-17 ENCOUNTER — Telehealth: Payer: Self-pay | Admitting: *Deleted

## 2014-12-17 NOTE — Telephone Encounter (Signed)
Returned call to Bjorn Loser, Charity fundraiser at [facility], requesting verbal order to send out a home PT and RN visit within 48 hours approx for further assistance of Mrs. Destiny Moore. Goals to improve strength with PT, RN for med assistance and reassurance, evaluation. Patient had recently called EMS again without significant specific concerns, not taken to ED for further testing, as she was doing better and no identified etiology of concern. I agree with this plan for continued maximizing [facility] services, additionally advised that I have recommended patient follow-up in our [facility] for further evaluation in future.  Will stay tuned for [facility] updates and further recommendations.  Saralyn Pilar, DO [facility], PGY-3

## 2014-12-17 NOTE — Telephone Encounter (Signed)
Bjorn Loser, RN with [facility] called needing a verbal order for physical therapy and nursing.  EMT was called by patient last night. Patient stating she was very weak and tired.  EMS did not take patient to the hospital, could not find anything wrong with patient. Needing the order for more nursing visits and physical therapy.  Call [facility] with questions at [phone].  Clovis Pu, RN

## 2014-12-18 ENCOUNTER — Telehealth: Payer: Self-pay | Admitting: *Deleted

## 2014-12-18 NOTE — Telephone Encounter (Signed)
Left voice message for Destiny Moore to check if she received verbal order from Dr. Althea Charon for the UA order for dysuria.  Clovis Pu, RN

## 2014-12-18 NOTE — Telephone Encounter (Signed)
Herbert Seta, RN with [facility] called stating patient has complaints of pain and burning with urination.  Would like to know if provider would give verbal orders to collect a UA.  Please give her a call at [phone]

## 2014-12-18 NOTE — Telephone Encounter (Signed)
Attempted to call Heather back. No answer. Left voicemail. Advised that I agree and would authorize a verbal order to check UA for dysuria.  Saralyn Pilar, DO [facility], PGY-3

## 2014-12-22 ENCOUNTER — Ambulatory Visit (INDEPENDENT_AMBULATORY_CARE_PROVIDER_SITE_OTHER): Payer: Medicare Other | Admitting: Family Medicine

## 2014-12-22 ENCOUNTER — Encounter: Payer: Self-pay | Admitting: Family Medicine

## 2014-12-22 VITALS — BP 147/70 | HR 79 | Temp 98.2°F | Ht 62.5 in | Wt 146.1 lb

## 2014-12-22 DIAGNOSIS — R32 Unspecified urinary incontinence: Secondary | ICD-10-CM

## 2014-12-22 DIAGNOSIS — R600 Localized edema: Secondary | ICD-10-CM | POA: Insufficient documentation

## 2014-12-22 DIAGNOSIS — I10 Essential (primary) hypertension: Secondary | ICD-10-CM

## 2014-12-22 DIAGNOSIS — N183 Chronic kidney disease, stage 3 unspecified: Secondary | ICD-10-CM

## 2014-12-22 DIAGNOSIS — N3000 Acute cystitis without hematuria: Secondary | ICD-10-CM | POA: Diagnosis not present

## 2014-12-22 MED ORDER — HYDROCHLOROTHIAZIDE 25 MG PO TABS: 25.0000 mg | ORAL_TABLET | Freq: Every day | ORAL | Status: DC

## 2014-12-22 MED ORDER — CEPHALEXIN 500 MG PO CAPS: 500.0000 mg | ORAL_CAPSULE | Freq: Four times a day (QID) | ORAL | Status: DC

## 2014-12-22 NOTE — Patient Instructions (Signed)
Dear Destiny Moore, Thank you for coming in to clinic today. It was good to see you!  1. Ears look good, no concerns - Take extra strength Tylenol  (2 tablets per dose) up to 3 times a day every 6-8 hours (maximum 6 tablets in 24 hours) - Use warm compress as needed  2. Labs overall  Look stable - Kidney function is stable, Chronic Kidney Disease Stage 3  3. DISCONTINUE AMLODIPINE - Take 2 tablets of Hydrochlorothiazide  total (take 2 of the 12.5mg  tabs) sent new rx in for the  tabs, take 1 daily  4. Urinary symptoms - Take Keflex antibiotic - 1 pill 4 times a day (ever 6 hours) for next 7 days. If symptoms persistent, please have Home Health, re-check urine at home.  If you develop more episodes of chest pain that resolve and you return to normal, you may call Cardiology (Dr. Elease Hashimoto) for a follow-up appointment (last seen in June 2016) Address: [address], [place], [place] [address] Phone: [phone]  Schedule "Geriatrics Clinic Appointment - Dr. McDiarmid" for Memory loss, falls, and urinary incontinence - anytime.  Please schedule follow-up for Blood Pressure in 1 month with Dr. Althea Charon  If you have any other questions or concerns, please feel free to call the clinic to contact me. You may also schedule an earlier appointment if necessary.  However, if your symptoms get significantly worse, please go to the Emergency Department to seek immediate medical attention.  Saralyn Pilar, DO [facility]

## 2014-12-22 NOTE — Progress Notes (Signed)
Subjective:    Patient ID: Destiny Moore, female    DOB: [birth year], 81 y.o.   MRN: [id number]  Destiny Moore is a 81 y.o. female presenting on 12/22/2014 for Follow-up  HPI  History provided both by patient and husband.  AoCKD-III, FOLLOW-UP LAB REVIEW: - Last seen on 12/09/14 by me for ED follow-up (from 8/7), BMET showed elevated SCr to 1.56, previously approx 1.4, recommended repeat lab in 1 week to follow SCr see if trending back to baseline. Advised to avoid all NSAIDs, can take Tylenol and home Hydrocodone PRN. - Today reviewed labs with stable SCr 1.56, resolved hypokalemia (off K supplement), seems to be new baseline. Discussed CKD causes. Tolerating PO well, voiding well overall but now with additional urinary complaints  DYSURIA / FREQUENCY / INCONTINENCE: - Reported dysuria previously (by [facility] RN on 8/18, given verbal order to collect UA but do not see that this was collected). Now states dysuria improved some but still having inc frequency, persistent chronic urinary incontinence. Concerned she may have UTI.   BILATERAL LE EDEMA: - Chronic episodic lower ext edema seems to come and go over course of days to weeks, worse recently in heat this summer. Current flare seems to be significantly worse than before. Does have compression stockings, not wearing currently. Elevation does improve. Not red or tender.  CHRONIC HTN: Reports no concerns with BP, checked by [facility] nursing Current Meds - Amlodipine 10mg  daily, HCTZ 12.5mg  daily   Reports good compliance, took meds today. Tolerating well, w/o complaints. Lifestyle - limited activity, uses cane for ambulation, high fall risk Denies CP, dyspnea, HA, edema, dizziness / lightheadedness  Social Hx: - Lives at home with good family support. Continues HH therapy (next visit 1 week)  Past Medical History  Diagnosis Date  . Hypertension   . Hyperlipidemia   . Anxiety   . UTI (urinary tract infection)   . Depression   .  Osteoporosis   . Allergy   . CKD (chronic kidney disease)     stage 3 as of 04/2014.     Social History   Social History  . Marital Status: Married    Spouse Name: MACK THURMON  . Number of Children: 2  . Years of Education: 10   Occupational History  . Retired- Emergency planning/management officer    Social History Main Topics  . Smoking status: Never Smoker   . Smokeless tobacco: Never Used  . Alcohol Use: No  . Drug Use: No  . Sexual Activity: Not on file   Other Topics Concern  . Not on file   Social History Narrative   Health Care POA:    Emergency Contact: Garnette Gunner [phone] (h)    End of Life Plan:    Who lives with you: Lives with husband,  step son Zeynep Fantroy   Any pets: 4 cats   Diet: Patient has a varied diet of protein, starch, and vegetables.    Exercise: Patient does not have any regular exercise plan.  Does not like to exercise by her self.   Seatbelts: Patient reports wearing seat belt when in vehicle.   Sun Exposure/Protection: Patient does not wear sun protection.   Hobbies: cooking, traveling, visiting family in [place]                 Current Outpatient Prescriptions on File Prior to Visit  Medication Sig  . acetaminophen (TYLENOL 8 HOUR) 650 MG CR tablet Take 1 tablet (650  mg total) by mouth every 8 (eight) hours as needed for pain.  Marland Kitchen alendronate (FOSAMAX) 70 MG tablet TAKE 1 TABLET EVERY 7 DAYS WITH A FULL GLASS OF WATER ON AN EMPTY STOMACH  . aspirin 81 MG tablet Take 81 mg by mouth daily.  . B Complex-C (B-COMPLEX WITH VITAMIN C) tablet Take 1 tablet by mouth daily.  . DUREZOL 0.05 % EMUL Place 1 drop into the right eye daily.   Marland Kitchen gabapentin (NEURONTIN) 100 MG capsule TAKE 3 CAPSULES BY MOUTH EVERY DAY AT BEDTIME  . pantoprazole (PROTONIX) 40 MG tablet TAKE 1 TABLET BY MOUTH DAILY BEFORE BREAKFAST.  . ranitidine (ZANTAC) 150 MG tablet TAKE 1 TABLET (150 MG TOTAL) BY MOUTH 2 (TWO) TIMES DAILY.  . simvastatin (ZOCOR) 20 MG tablet Take 1  tablet (20 mg total) by mouth daily.  Marland Kitchen EPINEPHrine (EPIPEN) 0.3 mg/0.3 mL DEVI Inject 0.3 mg into the muscle as needed (for allergic reaction).   Marland Kitchen HYDROcodone-acetaminophen (NORCO/VICODIN) 5-325 MG per tablet Take 1 tablet by mouth every 8 (eight) hours as needed for moderate pain. (Patient not taking: Reported on 12/22/2014)   Current Facility-Administered Medications on File Prior to Visit  Medication  . nitroGLYCERIN (NITROSTAT) SL tablet 0.3 mg    Review of Systems  Constitutional: Negative for fever, chills, diaphoresis, activity change, appetite change, fatigue and unexpected weight change.  HENT: Negative for congestion and hearing loss.   Eyes: Negative for visual disturbance.  Respiratory: Negative for cough, chest tightness, shortness of breath and wheezing.   Cardiovascular: Positive for leg swelling (bilateral below knees, worse recently but chronic history). Negative for chest pain and palpitations.  Gastrointestinal: Negative for nausea, vomiting, abdominal pain, diarrhea and constipation.  Genitourinary: Positive for dysuria (improving), urgency, frequency and difficulty urinating (occasional difficulty, then has urinary incontinence). Negative for hematuria, flank pain and decreased urine volume.  Musculoskeletal: Negative for arthralgias and neck pain.  Skin: Negative for rash.  Neurological: Negative for dizziness, syncope, weakness, light-headedness, numbness and headaches.  Hematological: Negative for adenopathy.  Psychiatric/Behavioral: Negative for behavioral problems and confusion.   Per HPI unless specifically indicated above     Objective:    BP 147/70 mmHg  Pulse 79  Temp(Src) 98.2 F (36.8 C) (Oral)  Ht 5' 2.5" (1.588 m)  Wt 146 lb 1.6 oz (66.271 kg)  BMI 26.28 kg/m2  Wt Readings from Last 3 Encounters:  12/22/14 146 lb 1.6 oz (66.271 kg)  12/09/14 146 lb 11.2 oz (66.543 kg)  12/07/14 140 lb (63.504 kg)    Physical Exam  Constitutional: She is  oriented to person, place, and time. She appears well-developed and well-nourished. No distress.  HENT:  Head: Normocephalic and atraumatic.  Mouth/Throat: Oropharynx is clear and moist.  Eyes: Conjunctivae and EOM are normal. Pupils are equal, round, and reactive to light.  Neck: Normal range of motion. Neck supple. No thyromegaly present.  Cardiovascular: Normal rate, regular rhythm, normal heart sounds and intact distal pulses.   No murmur heard. Pulmonary/Chest: Effort normal and breath sounds normal. No respiratory distress. She has no wheezes. She has no rales.  Good air movement  Abdominal: Soft. Bowel sounds are normal. She exhibits no distension and no mass. There is tenderness (mild suprapubic tenderness). There is no rebound and no guarding.  Musculoskeletal: Normal range of motion. She exhibits edema (bilateral lower ext below knees to ankle/feet, symmetrical, +1 vs non-pitting, without erythema, neagtive Homan's). She exhibits no tenderness.  Negative CVAT  Lymphadenopathy:    She has  no cervical adenopathy.  Neurological: She is alert and oriented to person, place, and time. No cranial nerve deficit. Coordination normal.  Skin: Skin is warm and dry. No rash noted. She is not diaphoretic.  Psychiatric: She has a normal mood and affect. Her behavior is normal.  Nursing note and vitals reviewed.  Results for orders placed or performed in visit on 12/15/14  BASIC METABOLIC PANEL WITH GFR  Result Value Ref Range   Sodium 145 135 - 146 mmol/L   Potassium 4.1 3.5 - 5.3 mmol/L   Chloride 104 98 - 110 mmol/L   CO2 27 20 - 31 mmol/L   Glucose, Bld 92 65 - 99 mg/dL   BUN 18 7 - 25 mg/dL   Creat 1.61 (H) 0.96 - 0.93 mg/dL   Calcium 04.5 8.6 - 40.9 mg/dL   GFR, Est African American 36 (L) >=60 mL/min   GFR, Est Non African American 31 (L) >=60 mL/min      Assessment & Plan:   Problem List Items Addressed This Visit      Cardiovascular and Mediastinum   Essential hypertension  - Primary    Stable BP, at goal < 150 / 90 - Concern for risk hypotension, fall risk - Now possible Amlodipine side effect with bilateral significant lower ext edema - Complication: CKD-III  Plan: 1. Discontinue Amlodipine  daily (due to peripheral edema, also age 69 not preferred choice), increase HCTZ 12.5mg  to  daily (sent new rx in, instructed to finish old pills take 2 daily) 2. Re-check BMET with SCr 1.56 stable, likely new baseline 3. Monitor BP at home, HH 4. Future, note allergy to BB (coreg) and Lisinopril, consider ARB if needed      Relevant Medications   hydrochlorothiazide (HYDRODIURIL) 25 MG tablet     Genitourinary   Acute cystitis    Clinically consistent with UTI / acute cystitis on exam with suprapubic pain, dysuria and frequency. Otherwise well appearing, afebrile, tolerating PO, no n/v, no concern for pyelo. - Unfortunately, UA not drawn by [facility] previously, and now due to running late today unable to get UA sample.  Plan: 1. Empiric therapy with Keflex  BID x 7 days, max dose  daily for GFR 31 (30-59), initially had prescribed  QID x 7 days. Called and spoke with Husband 8/23 to advise him to reduce dosage to  BID for 7 days (will have #14 pills left over, can save if needed in future). He understands and will advise her of this change, she started taking it 8/23. 2. RTC if no improvement 2 weeks, red flags given to return sooner. Consider repeat UA, urine culture       CKD (chronic kidney disease) stage 3, GFR 30-59 ml/min    Confirm likely new baseline SCr 1.56, GFR 31, at CKD-III concern for future progression to CKD-IV - Discussed importance of controlling BP, but cannot control age and at risk for continued kidney function decline. Consider renal referral in future, previously denied this recommendation per chart review        Other   Bilateral lower extremity edema    Significant bilateral edema, below knees to ankles, trace  pitting and seems more like Amlodipine induced in setting of some chronic venous insufficiency and recent heat worsening. Symmetrical and not consistent with DVT - Discontinued Amlodipine  (inc HCTZ  daily for now) - Checked BMET, SCr unchanged, otherwise no other etiology for edema - RTC 1 month, return criteria given  Urinary incontinence      Meds ordered this encounter  Medications  . hydrochlorothiazide (HYDRODIURIL) 25 MG tablet    Sig: Take 1 tablet (25 mg total) by mouth daily.    Dispense:  90 tablet    Refill:  3  .                                  Follow up plan: Return in about 4 weeks (around 01/19/2015) for blood pressure.  Saralyn Pilar, DO [facility], PGY-3

## 2014-12-23 MED ORDER — CEPHALEXIN 500 MG PO CAPS: 500.0000 mg | ORAL_CAPSULE | Freq: Two times a day (BID) | ORAL | Status: DC

## 2014-12-23 NOTE — Assessment & Plan Note (Signed)
Stable BP, at goal < 150 / 90 - Concern for risk hypotension, fall risk - Now possible Amlodipine side effect with bilateral significant lower ext edema - Complication: CKD-III  Plan: 1. Discontinue Amlodipine  daily (due to peripheral edema, also age 81 not preferred choice), increase HCTZ 12.5mg  to  daily (sent new rx in, instructed to finish old pills take 2 daily) 2. Re-check BMET with SCr 1.56 stable, likely new baseline 3. Monitor BP at home, HH 4. Future, note allergy to BB (coreg) and Lisinopril, consider ARB if needed

## 2014-12-23 NOTE — Assessment & Plan Note (Addendum)
Significant bilateral edema, below knees to ankles, trace pitting and seems more like Amlodipine induced in setting of some chronic venous insufficiency and recent heat worsening. Symmetrical and not consistent with DVT - Discontinued Amlodipine  (inc HCTZ  daily for now) - Checked BMET, SCr unchanged, otherwise no other etiology for edema - RTC 1 month, return criteria given

## 2014-12-23 NOTE — Assessment & Plan Note (Signed)
Clinically consistent with UTI / acute cystitis on exam with suprapubic pain, dysuria and frequency. Otherwise well appearing, afebrile, tolerating PO, no n/v, no concern for pyelo. - Unfortunately, UA not drawn by [facility] previously, and now due to running late today unable to get UA sample.  Plan: 1. Empiric therapy with Keflex  BID x 7 days, max dose  daily for GFR 31 (30-59), initially had prescribed  QID x 7 days. Called and spoke with Husband 8/23 to advise him to reduce dosage to  BID for 7 days (will have #14 pills left over, can save if needed in future). He understands and will advise her of this change, she started taking it 8/23. 2. RTC if no improvement 2 weeks, red flags given to return sooner. Consider repeat UA, urine culture

## 2014-12-23 NOTE — Assessment & Plan Note (Signed)
Confirm likely new baseline SCr 1.56, GFR 31, at CKD-III concern for future progression to CKD-IV - Discussed importance of controlling BP, but cannot control age and at risk for continued kidney function decline. Consider renal referral in future, previously denied this recommendation per chart review

## 2015-01-02 ENCOUNTER — Telehealth: Payer: Self-pay | Admitting: Family Medicine

## 2015-01-02 NOTE — Telephone Encounter (Signed)
Left message on voicemail for Sree to call back.

## 2015-01-02 NOTE — Telephone Encounter (Signed)
Sree from [facility] seen the patient today and her BP 160/80. She didn't sleep well last night because she is having sharp pains in her rib cage. Please call. jw

## 2015-01-03 ENCOUNTER — Ambulatory Visit (INDEPENDENT_AMBULATORY_CARE_PROVIDER_SITE_OTHER): Payer: Medicare Other | Admitting: Family Medicine

## 2015-01-03 VITALS — BP 148/80 | HR 70 | Temp 98.3°F | Resp 16 | Ht 62.0 in | Wt 145.6 lb

## 2015-01-03 DIAGNOSIS — B029 Zoster without complications: Secondary | ICD-10-CM

## 2015-01-03 DIAGNOSIS — N183 Chronic kidney disease, stage 3 unspecified: Secondary | ICD-10-CM

## 2015-01-03 DIAGNOSIS — M25512 Pain in left shoulder: Secondary | ICD-10-CM | POA: Diagnosis not present

## 2015-01-03 MED ORDER — VALACYCLOVIR HCL 1 G PO TABS: 1000.0000 mg | ORAL_TABLET | Freq: Two times a day (BID) | ORAL | Status: DC

## 2015-01-03 NOTE — Patient Instructions (Signed)

## 2015-01-03 NOTE — Progress Notes (Signed)
Chief Complaint:  Chief Complaint  Patient presents with  . Rash     back, chest x 2 days   . Hypertension    pt. concerned about blood pressure  . Breathing Problem    pt. states breathe deep have pain under left side under arm    HPI: Destiny Moore is a 81 y.o. female who reports to [facility] today complaining of  2 day history on the right side of her back. It hurts for her to take a deep breath in that area. Can;t sleep well due to pain, she has not tried anythign for it.  She also has left shoulder pain , she has had this evaluated and is in PT, she states she feels pain and a click when she does any overhead motion with her shoulder. It bothers her but does nto prevent her from doing any of her ADLs.  She has  HTN but it is stable for her and is < 150/90. Denies CP, palpitations, dizziness. Followed by [facility] residency and cardiology MD is Dr Elease Hashimoto, she was previously on norvasc 10 mg and also HCTZ 12.5 mg. Recent DC of norvasc due to edema. Increase HCTZ to 25 mg daily. She does have a hx of hypokalemia last BMP, K was normal but before increase dosing of HCTZ.She has had recent ER visits due to fall. Has had UTI and was given Keflex to take 500 mg BID x 7 days on 8/23. So there has been a lot of stressors. Not vaccinated for shingles.   BP Readings from Last 3 Encounters:  01/03/15 148/80  12/22/14 147/70  12/09/14 140/64   EXAM: LEFT SHOULDER - 2+ VIEW  COMPARISON: None.  FINDINGS: No fracture or dislocation. There are moderate AC joint osteoarthritic changes. The glenohumeral joint is well preserved. Soft tissues are unremarkable.  IMPRESSION: No fracture or dislocation. Moderate AC joint osteoarthritis.   Electronically Signed  By: Amie Portland M.D.  Past Medical History  Diagnosis Date  . Hypertension   . Hyperlipidemia   . Anxiety   . UTI (urinary tract infection)   . Depression   . Osteoporosis   . Allergy   . CKD (chronic kidney disease)     stage 3 as of 04/2014.    Past Surgical History  Procedure Laterality Date  . Hystrectomy    . Bladder tact    . Abdominal hysterectomy    . Incontinence surgery    . Cholecystectomy N/A 05/08/2014    Procedure: LAPAROSCOPIC CHOLECYSTECTOMY WITH INTRAOPERATIVE CHOLANGIOGRAM;  Surgeon: Manus Rudd, MD;  Location: [facility] OR;  Service: General;  Laterality: N/A;   Social History   Social History  . Marital Status: Married    Spouse Name: BRENETTA PENNY  . Number of Children: 2  . Years of Education: 10   Occupational History  . Retired- Emergency planning/management officer    Social History Main Topics  . Smoking status: Never Smoker   . Smokeless tobacco: Never Used  . Alcohol Use: No  . Drug Use: No  . Sexual Activity: Not Asked   Other Topics Concern  . None   Social History Narrative   Health Care POA:    Emergency Contact: Garnette Gunner [phone] (h)    End of Life Plan:    Who lives with you: Lives with husband,  step son Rhyse Skowron   Any pets: 4 cats   Diet: Patient has a varied diet of protein, starch, and vegetables.  Exercise: Patient does not have any regular exercise plan.  Does not like to exercise by her self.   Seatbelts: Patient reports wearing seat belt when in vehicle.   Sun Exposure/Protection: Patient does not wear sun protection.   Hobbies: cooking, traveling, visiting family in [place]                Family History  Problem Relation Age of Onset  . Heart disease Mother   . Cancer Sister     breast  . Cancer Brother     skin, kidney, lung   Allergies  Allergen Reactions  . Coreg Anaphylaxis  . Lisinopril Anaphylaxis    REACTION: Anaphylaxis  . Sertraline Hcl Other (See Comments)    Could not feed herself  . Carvedilol Other (See Comments)    Unknown    Prior to Admission medications   Medication Sig Start Date End Date Taking? Authorizing Provider  acetaminophen (TYLENOL 8 HOUR) 650 MG CR tablet Take 1 tablet (650 mg total) by mouth every 8  (eight) hours as needed for pain. 11/06/14  Yes Almon Hercules, MD  alendronate (FOSAMAX) 70 MG tablet TAKE 1 TABLET EVERY 7 DAYS WITH A FULL GLASS OF WATER ON AN EMPTY STOMACH 09/18/14  Yes Smitty Cords, DO  aspirin 81 MG tablet Take 81 mg by mouth daily.   Yes Historical Provider, MD  B Complex-C (B-COMPLEX WITH VITAMIN C) tablet Take 1 tablet by mouth daily.   Yes Historical Provider, MD  EPINEPHrine (EPIPEN) 0.3 mg/0.3 mL DEVI Inject 0.3 mg into the muscle as needed (for allergic reaction).    Yes Historical Provider, MD  gabapentin (NEURONTIN) 100 MG capsule TAKE 3 CAPSULES BY MOUTH EVERY DAY AT BEDTIME 11/17/14  Yes Alexander J Karamalegos, DO  hydrochlorothiazide (HYDRODIURIL) 25 MG tablet Take 1 tablet (25 mg total) by mouth daily. 12/22/14  Yes Smitty Cords, DO  HYDROcodone-acetaminophen (NORCO/VICODIN) 5-325 MG per tablet Take 1 tablet by mouth every 8 (eight) hours as needed for moderate pain. 08/01/14  Yes Alexander J Karamalegos, DO  pantoprazole (PROTONIX) 40 MG tablet TAKE 1 TABLET BY MOUTH DAILY BEFORE BREAKFAST. 11/17/14  Yes Alexander J Karamalegos, DO  ranitidine (ZANTAC) 150 MG tablet TAKE 1 TABLET (150 MG TOTAL) BY MOUTH 2 (TWO) TIMES DAILY. 11/17/14  Yes Alexander Fredric Mare, DO  simvastatin (ZOCOR) 20 MG tablet Take 1 tablet (20 mg total) by mouth daily. 11/17/14  Yes Alexander J Karamalegos, DO  DUREZOL 0.05 % EMUL Place 1 drop into the right eye daily.  03/11/14   Historical Provider, MD  valACYclovir (VALTREX) 1000 MG tablet Take 1 tablet (1,000 mg total) by mouth 2 (two) times daily. 01/03/15   Ramir Malerba P Mindel Friscia, DO     ROS: The patient denies fevers, chills, night sweats, unintentional weight loss, chest pain, palpitations, wheezing, dyspnea on exertion, nausea, vomiting, abdominal pain, dysuria, hematuria, melena  All other systems have been reviewed and were otherwise negative with the exception of those mentioned in the HPI and as above.    PHYSICAL EXAM: Filed  Vitals:   01/03/15 1143  BP: 148/80  Pulse: 70  Temp: 98.3 F (36.8 C)  Resp: 16   Body mass index is 26.62 kg/(m^2).   General: Alert, no acute distress HEENT:  Normocephalic, atraumatic, oropharynx patent. EOMI, PERRLA Cardiovascular:  Regular rate and rhythm, no rubs murmurs or gallops.  No Carotid bruits, radial pulse intact. No pedal edema.  Respiratory: Clear to auscultation bilaterally.  No wheezes, rales,  or rhonchi.  No cyanosis, no use of accessory musculature Abdominal: No organomegaly, abdomen is soft and non-tender, positive bowel sounds. No masses. Skin: + shingles Neurologic: Facial musculature symmetric. Psychiatric: Patient acts appropriately throughout our interaction. Lymphatic: No cervical or submandibular lymphadenopathy Musculoskeletal: Gait intact. No edema, tenderness + left shoulder click, no decrease ROM in PROM She has to have help lifting rm above shoudler elvel but able to do it on her own.  senstiaon intact, 5/5 strength, neg for drop arm test +/-Neers, Hawkins Neg speeds   LABS: Results for orders placed or performed in visit on 12/15/14  BASIC METABOLIC PANEL WITH GFR  Result Value Ref Range   Sodium 145 135 - 146 mmol/L   Potassium 4.1 3.5 - 5.3 mmol/L   Chloride 104 98 - 110 mmol/L   CO2 27 20 - 31 mmol/L   Glucose, Bld 92 65 - 99 mg/dL   BUN 18 7 - 25 mg/dL   Creat 1.61 (H) 0.96 - 0.93 mg/dL   Calcium 04.5 8.6 - 40.9 mg/dL   GFR, Est African American 36 (L) >=60 mL/min   GFR, Est Non African American 31 (L) >=60 mL/min     EKG/XRAY:   Primary read interpreted by Dr. Conley Rolls at [facility].   ASSESSMENT/PLAN: Encounter Diagnoses  Name Primary?  . Left shoulder pain   . Shingles rash Yes  . CKD (chronic kidney disease) stage 3, GFR 30-59 ml/min    Rx Valtrex with CKD and decrease GFR in mind, Rx Valtrex 1 gram BID x 10 days Discussed with patient about shoulder and since it is not limiting her ADLs and she is in PT then we can cont to  monitor or she can be referred to ortho, she will let us know or her PCP know, she is doing pretty well considering age and daily function status HTN is  under 150/90. She is to follow up with PCP for recheck of HTN med changes since 12/23/14  Gross sideeffects, risk and benefits, and alternatives of medications d/w patient. Patient is aware that all medications have potential sideeffects and we are unable to predict every sideeffect or drug-drug interaction that may occur.  Santanna Whitford DO  01/03/2015 2:16 PM

## 2015-01-06 ENCOUNTER — Telehealth: Payer: Self-pay | Admitting: Family Medicine

## 2015-01-06 DIAGNOSIS — M79604 Pain in right leg: Secondary | ICD-10-CM

## 2015-01-06 DIAGNOSIS — M79605 Pain in left leg: Secondary | ICD-10-CM

## 2015-01-06 NOTE — Telephone Encounter (Signed)
Patient informed of message from MD, expressed understanding (Also had patient repeat instructions back to me). Patient to schedule appointment if recommendations from MD do not help.

## 2015-01-06 NOTE — Telephone Encounter (Signed)
Will await call back form Sree, patient recently seen at Urgent care for this issue. FYI to MD.

## 2015-01-06 NOTE — Telephone Encounter (Signed)
Reviewed chart. Last seen 01/03/15 at Urgent Care for same Right sided pain complaint. Dx with Shingles. Given Valtrex. This is most likely cause of her pain and elevated BP. I am okay with current BP as long as stable to less than 160/90. Additionally, my recommendation for the Right sided pain / Shingles would be to increase Gabapentin (normally takes  or 3 capsules at nighttime) she could take 1-2 capsules both in morning and afternoon (slowly increase over course of next 1-2 weeks), add one capsule every 3 days, until max dose 2 capsules in morning, 2 afternoon and 3 evening. Caution on sedation with these pills, only take if tolerating.  Follow-up as needed if not improving.  Saralyn Pilar, DO [facility], PGY-3

## 2015-01-08 NOTE — Telephone Encounter (Signed)
Returned call to [facility]), unable to reach, left voicemail and given verbal authorization to add one visit. Additionally stated that pt may take Tylenol  1-2 tabs 3 times a day with gabapentin for pain (max dose 6 tabs in 24 hours), this is safe with her current Norco 1-3 x daily.  Saralyn Pilar, DO [facility], PGY-3

## 2015-01-08 NOTE — Telephone Encounter (Signed)
[facility] nurse would like to add one visit for next--possible discharge. Also pt wants to know if she can take Tylenol along with the gabapentin Please advise

## 2015-01-12 ENCOUNTER — Encounter (HOSPITAL_COMMUNITY): Payer: Self-pay | Admitting: Emergency Medicine

## 2015-01-12 ENCOUNTER — Other Ambulatory Visit: Payer: Self-pay | Admitting: Family Medicine

## 2015-01-12 ENCOUNTER — Emergency Department (HOSPITAL_COMMUNITY)
Admission: EM | Admit: 2015-01-12 | Discharge: 2015-01-12 | Disposition: A | Discharge status: Home / Self Care | Payer: Medicare Other | Attending: Physician Assistant | Admitting: Physician Assistant

## 2015-01-12 ENCOUNTER — Telehealth: Payer: Self-pay | Admitting: *Deleted

## 2015-01-12 DIAGNOSIS — B029 Zoster without complications: Secondary | ICD-10-CM

## 2015-01-12 DIAGNOSIS — Z8739 Personal history of other diseases of the musculoskeletal system and connective tissue: Secondary | ICD-10-CM | POA: Diagnosis not present

## 2015-01-12 DIAGNOSIS — I129 Hypertensive chronic kidney disease with stage 1 through stage 4 chronic kidney disease, or unspecified chronic kidney disease: Secondary | ICD-10-CM | POA: Diagnosis not present

## 2015-01-12 DIAGNOSIS — N183 Chronic kidney disease, stage 3 (moderate): Secondary | ICD-10-CM | POA: Diagnosis not present

## 2015-01-12 DIAGNOSIS — Z79899 Other long term (current) drug therapy: Secondary | ICD-10-CM | POA: Insufficient documentation

## 2015-01-12 DIAGNOSIS — F329 Major depressive disorder, single episode, unspecified: Secondary | ICD-10-CM | POA: Diagnosis not present

## 2015-01-12 DIAGNOSIS — M159 Polyosteoarthritis, unspecified: Secondary | ICD-10-CM

## 2015-01-12 DIAGNOSIS — F419 Anxiety disorder, unspecified: Secondary | ICD-10-CM | POA: Insufficient documentation

## 2015-01-12 DIAGNOSIS — Z7982 Long term (current) use of aspirin: Secondary | ICD-10-CM | POA: Insufficient documentation

## 2015-01-12 DIAGNOSIS — M150 Primary generalized (osteo)arthritis: Secondary | ICD-10-CM

## 2015-01-12 DIAGNOSIS — Z87440 Personal history of urinary (tract) infections: Secondary | ICD-10-CM | POA: Insufficient documentation

## 2015-01-12 DIAGNOSIS — E785 Hyperlipidemia, unspecified: Secondary | ICD-10-CM | POA: Diagnosis not present

## 2015-01-12 DIAGNOSIS — R21 Rash and other nonspecific skin eruption: Secondary | ICD-10-CM | POA: Diagnosis present

## 2015-01-12 MED ORDER — ONDANSETRON HCL 4 MG/2ML IJ SOLN
4.0000 mg | Freq: Once | INTRAMUSCULAR | Status: AC
  Administered 2015-01-12: 4 mg via INTRAVENOUS
  Filled 2015-01-12: qty 2

## 2015-01-12 MED ORDER — HYDROCODONE-ACETAMINOPHEN 5-325 MG PO TABS: 1.0000 | ORAL_TABLET | Freq: Three times a day (TID) | ORAL | Status: DC | PRN

## 2015-01-12 MED ORDER — HYDROCHLOROTHIAZIDE 25 MG PO TABS
25.0000 mg | ORAL_TABLET | Freq: Every day | ORAL | Status: DC
  Administered 2015-01-12: 25 mg via ORAL
  Filled 2015-01-12: qty 1

## 2015-01-12 MED ORDER — MORPHINE SULFATE (PF) 4 MG/ML IV SOLN
4.0000 mg | Freq: Once | INTRAVENOUS | Status: AC
  Administered 2015-01-12: 4 mg via INTRAVENOUS
  Filled 2015-01-12: qty 1

## 2015-01-12 NOTE — Discharge Instructions (Signed)

## 2015-01-12 NOTE — ED Notes (Signed)
To ED via [facility] from home with c/o shingles pain-- was seen last week with dx of shingles on right side of chest, breast and back pain.

## 2015-01-12 NOTE — Telephone Encounter (Signed)
Called back [place], Charity fundraiser with [facility]. I gave her verbal authorization to recertify the [facility] orders for continuing education of diagnosis and medication education, may proceed with proposed 5 visits and PRN, anticipate future discharge.  Saralyn Pilar, DO [facility], PGY-3

## 2015-01-12 NOTE — Telephone Encounter (Signed)
Herbert Seta, RN with [facility] called for verbal orders for recertification for continuing education of diagnosis and medication.  She need a total of 5 visits and 1 as needed visit (1 wk-1, 2 wk-1 and 1 wk-2). Please give her a call at [phone].  Clovis Pu, RN

## 2015-01-12 NOTE — ED Provider Notes (Signed)
CSN: [id number]     Arrival date & time 01/12/15  4098 History   First MD Initiated Contact with Patient 01/12/15 0745     Chief Complaint  Patient presents with  . Herpes Zoster     (Consider location/radiation/quality/duration/timing/severity/associated sxs/prior Treatment) Patient is a 81 y.o. female presenting with rash. The history is provided by the patient. No language interpreter was used.  Rash Location:  Torso Torso rash location:  Upper back and R chest Quality: blistering   Severity:  Severe Onset quality:  Gradual Duration:  1 week Timing:  Constant Progression:  Worsening Chronicity:  New Relieved by:  Nothing Worsened by:  Nothing tried Ineffective treatments:  None tried Associated symptoms: not vomiting   Pt complains of severe pain from shingles.  Pt is out of hydrocodone.   Pt reports pain has become worse. She has finished valtrex. Pt reports she is taking gabapentin.    Past Medical History  Diagnosis Date  . Hypertension   . Hyperlipidemia   . Anxiety   . UTI (urinary tract infection)   . Depression   . Osteoporosis   . Allergy   . CKD (chronic kidney disease)     stage 3 as of 04/2014.    Past Surgical History  Procedure Laterality Date  . Hystrectomy    . Bladder tact    . Abdominal hysterectomy    . Incontinence surgery    . Cholecystectomy N/A 05/08/2014    Procedure: LAPAROSCOPIC CHOLECYSTECTOMY WITH INTRAOPERATIVE CHOLANGIOGRAM;  Surgeon: Manus Rudd, MD;  Location: [facility] OR;  Service: General;  Laterality: N/A;   Family History  Problem Relation Age of Onset  . Heart disease Mother   . Cancer Sister     breast  . Cancer Brother     skin, kidney, lung   Social History  Substance Use Topics  . Smoking status: Never Smoker   . Smokeless tobacco: Never Used  . Alcohol Use: No   OB History    No data available     Review of Systems  Gastrointestinal: Negative for vomiting.  Skin: Positive for rash.  All other systems reviewed  and are negative.     Allergies  Coreg; Lisinopril; Sertraline hcl; and Carvedilol  Home Medications   Prior to Admission medications   Medication Sig Start Date End Date Taking? Authorizing Provider  acetaminophen (TYLENOL 8 HOUR) 650 MG CR tablet Take 1 tablet (650 mg total) by mouth every 8 (eight) hours as needed for pain. 11/06/14   Almon Hercules, MD  alendronate (FOSAMAX) 70 MG tablet TAKE 1 TABLET EVERY 7 DAYS WITH A FULL GLASS OF WATER ON AN EMPTY STOMACH 09/18/14   Smitty Cords, DO  aspirin 81 MG tablet Take 81 mg by mouth daily.    Historical Provider, MD  B Complex-C (B-COMPLEX WITH VITAMIN C) tablet Take 1 tablet by mouth daily.    Historical Provider, MD  DUREZOL 0.05 % EMUL Place 1 drop into the right eye daily.  03/11/14   Historical Provider, MD  EPINEPHrine (EPIPEN) 0.3 mg/0.3 mL DEVI Inject 0.3 mg into the muscle as needed (for allergic reaction).     Historical Provider, MD  gabapentin (NEURONTIN) 100 MG capsule TAKE 3 CAPSULES BY MOUTH EVERY DAY AT BEDTIME 01/06/15   Smitty Cords, DO  hydrochlorothiazide (HYDRODIURIL) 25 MG tablet Take 1 tablet (25 mg total) by mouth daily. 12/22/14   Smitty Cords, DO  HYDROcodone-acetaminophen (NORCO/VICODIN) 5-325 MG per tablet Take 1 tablet  by mouth every 8 (eight) hours as needed for moderate pain. 08/01/14   Netta Neat Karamalegos, DO  pantoprazole (PROTONIX) 40 MG tablet TAKE 1 TABLET BY MOUTH DAILY BEFORE BREAKFAST. 11/17/14   Smitty Cords, DO  ranitidine (ZANTAC) 150 MG tablet TAKE 1 TABLET (150 MG TOTAL) BY MOUTH 2 (TWO) TIMES DAILY. 11/17/14   Smitty Cords, DO  simvastatin (ZOCOR) 20 MG tablet Take 1 tablet (20 mg total) by mouth daily. 11/17/14   Smitty Cords, DO  valACYclovir (VALTREX) 1000 MG tablet Take 1 tablet (1,000 mg total) by mouth 2 (two) times daily. 01/03/15   Thao P Le, DO   BP 171/73 mmHg  Pulse 61  Temp(Src) 98.5 F (36.9 C) (Oral)  Resp 20  SpO2  99% Physical Exam  Constitutional: She is oriented to person, place, and time. She appears well-developed and well-nourished.  HENT:  Head: Normocephalic and atraumatic.  Nose: Nose normal.  Mouth/Throat: Oropharynx is clear and moist.  Eyes: EOM are normal. Pupils are equal, round, and reactive to light.  Neck: Normal range of motion.  Cardiovascular: Normal rate and normal heart sounds.   Pulmonary/Chest: Effort normal.  Abdominal: She exhibits no distension.  Musculoskeletal: Normal range of motion.  Neurological: She is alert and oriented to person, place, and time.  Skin: Rash noted.  Drying rash right back, right anterior chest.     Psychiatric: She has a normal mood and affect.  Nursing note and vitals reviewed.   ED Course  Procedures (including critical care time) Labs Review Labs Reviewed - No data to display  Imaging Review No results found. I have personally reviewed and evaluated these images and lab results as part of my medical decision-making.   EKG Interpretation None      MDM  Pt given pain medication Iv.   Pt feels better.  Pt had elevated blood pressure.  She has not taken her Hctz today.   Pt given hctz and observed.  Pt had decrease in blood pressure.   Pt given rx for pain medication.   Pt advised to call and see her MD for recheck in 2 days   Final diagnoses:  Shingles    avs   Elson Areas, PA-C 01/12/15 1352  Courteney Lyn Mackuen, MD 01/12/15 1536

## 2015-01-12 NOTE — ED Notes (Signed)
Pt advised she is waiting for her husband to bring her clothes to wear home upon discharge.

## 2015-01-12 NOTE — ED Notes (Signed)
PA sofia at bedside

## 2015-01-14 ENCOUNTER — Other Ambulatory Visit: Payer: Self-pay | Admitting: Family Medicine

## 2015-01-14 DIAGNOSIS — B029 Zoster without complications: Secondary | ICD-10-CM

## 2015-01-14 NOTE — Telephone Encounter (Signed)
Pt is needing a refill on her Valtrex, sent in. The pharmacy has ask electronically but in the wrong doctors name. jw

## 2015-01-15 DIAGNOSIS — B029 Zoster without complications: Secondary | ICD-10-CM | POA: Insufficient documentation

## 2015-01-15 NOTE — Telephone Encounter (Signed)
Sent refill.  Araseli Sherry, DO [facility], PGY-3  

## 2015-01-16 ENCOUNTER — Telehealth: Payer: Self-pay | Admitting: *Deleted

## 2015-01-16 NOTE — Telephone Encounter (Signed)
Reviewed chart. Last OV with me 12/22/14, addressed the BP medication questions below.  Called back and spoke with Herbert Seta to clarify the medications. Additionally completed, signed and faxed back forms from [place] today to authorize the plan of care for this patient to continue with [facility] for about 5 more visits approximately.  1. Potassium - I advised that she may discontinue OTC potassium supplement. Recommend taking daily MVI. Discontinue prescription potassium 10 mEq, which should no longer be active as well, this was not prescribed by me, but given in the ED on 12/07/14 for low K 2.9, only brief course, since completed and her K has been re-checked in normal range.  2. BP Meds - On 12/22/14, due to patients lower ext edema, I had made the switch to: Discontinue Amlodipine  and Increase HCTZ from 12.5mg  to  daily, new rx was sent in to pharmacy.  3. GERD - Recommend continue Protonix  daily. Discontinue regular Zantac  BID use, but may use this PRN if symptoms worsening. Will revisit this at future appointments.  Saralyn Pilar, DO [facility], PGY-3

## 2015-01-16 NOTE — Telephone Encounter (Signed)
Herbert Seta, RN with [facility] called needing clarification on patient's several medications.  She stated that patient is taking 99 mg of potassium OTC.  Should patient continue with Norvasc, did Trenten Watchman prescribe 10 mEq of potassium, need the correct dosage of HCTZ and should patient be taking both Protonix and Zantac.  Please give her a call at [phone]. Clovis Pu, RN

## 2015-01-19 ENCOUNTER — Other Ambulatory Visit: Payer: Self-pay | Admitting: Family Medicine

## 2015-01-19 DIAGNOSIS — B029 Zoster without complications: Secondary | ICD-10-CM

## 2015-02-03 ENCOUNTER — Other Ambulatory Visit: Payer: Self-pay | Admitting: Family Medicine

## 2015-02-03 DIAGNOSIS — K219 Gastro-esophageal reflux disease without esophagitis: Secondary | ICD-10-CM

## 2015-02-10 ENCOUNTER — Telehealth: Payer: Self-pay | Admitting: Family Medicine

## 2015-02-10 NOTE — Telephone Encounter (Signed)
Had faxed plan of care on 01-16-15 and has never received it back. Pt is now out of Medicare complinance. She is faxing another one today. It has to be signed by Dr Randolm Idol Fax number for [facility] is [phone]

## 2015-02-11 NOTE — Telephone Encounter (Signed)
I completed the original [facility] plan [facility] care that was faxed in 01/2015, however now it does seem that it needed to be co-signed by Dr. Randolm IdolFletke. This was never re-sent or I was not notified of this until now.  I am out on vacation, and am unable to complete this. Routing message again back to Dr. Randolm IdolFletke and Red Team to pick up this form from my box to complete whenever new form is faxed.  Thank you Saralyn PilarAlexander Karamalegos, DO [facility], PGY-3

## 2015-02-11 NOTE — Telephone Encounter (Signed)
I have signed the form and placed it in the fax box.

## 2015-02-11 NOTE — Telephone Encounter (Signed)
Forms placed in Dr. Versie StarksFletke's box

## 2015-02-17 ENCOUNTER — Telehealth: Payer: Self-pay | Admitting: *Deleted

## 2015-02-17 NOTE — Telephone Encounter (Signed)
Herbert SetaHeather, RN with [facility] called stating patient was discharged [facility] today.  She would also like a call back from provider to clarify which medication was discontinued.  She has questions regarding the Protonix and the Ranitidine.  Please call [phone].  Clovis PuMartin, Dennise Bamber L, RN

## 2015-02-18 NOTE — Telephone Encounter (Signed)
Attempted to call Heather RN back, unable to reach her but left voice mail. Recommend continue Protonix 40mg  daily. Discontinue regular Zantac 150mg  BID use, but may use this PRN if symptoms worsening. Will revisit this at future appointments. Note I had refilled the Zantac previously due to patient request, and she can use it PRN but my recommendation is to discontinue Ranitidine (Zantac) and continue Protonix 40mg  daily.  Saralyn PilarAlexander Karamalegos, DO [facility], PGY-3

## 2015-02-24 ENCOUNTER — Other Ambulatory Visit: Payer: Self-pay | Admitting: *Deleted

## 2015-02-24 DIAGNOSIS — M810 Age-related osteoporosis without current pathological fracture: Secondary | ICD-10-CM

## 2015-02-25 MED ORDER — ALENDRONATE SODIUM 70 MG PO TABS: ORAL_TABLET | ORAL | Status: DC

## 2015-02-25 NOTE — Telephone Encounter (Signed)
Update: Received Discharge Summary fax from [facility] on 02/23/15 that summarizes discharge plan from [facility] services on 02/17/15. - Diagnoses: repeated falls, unstable and abnormal gait, pain in left shoulder - Met 90% of goals - overall functional improvement - discharge summary to be scanned into chart

## 2015-02-27 ENCOUNTER — Ambulatory Visit (INDEPENDENT_AMBULATORY_CARE_PROVIDER_SITE_OTHER): Payer: Medicare Other | Admitting: Family Medicine

## 2015-02-27 ENCOUNTER — Encounter: Payer: Self-pay | Admitting: Family Medicine

## 2015-02-27 VITALS — BP 130/70 | HR 87 | Temp 98.3°F | Ht 62.0 in | Wt 144.0 lb

## 2015-02-27 DIAGNOSIS — M159 Polyosteoarthritis, unspecified: Secondary | ICD-10-CM

## 2015-02-27 DIAGNOSIS — M79604 Pain in right leg: Secondary | ICD-10-CM | POA: Diagnosis not present

## 2015-02-27 DIAGNOSIS — M150 Primary generalized (osteo)arthritis: Secondary | ICD-10-CM

## 2015-02-27 DIAGNOSIS — M19012 Primary osteoarthritis, left shoulder: Secondary | ICD-10-CM | POA: Diagnosis not present

## 2015-02-27 DIAGNOSIS — M79605 Pain in left leg: Secondary | ICD-10-CM

## 2015-02-27 MED ORDER — GABAPENTIN 100 MG PO CAPS: 100.0000 mg | ORAL_CAPSULE | Freq: Three times a day (TID) | ORAL | Status: DC

## 2015-02-27 MED ORDER — HYDROCODONE-ACETAMINOPHEN 5-325 MG PO TABS: 1.0000 | ORAL_TABLET | Freq: Three times a day (TID) | ORAL | Status: DC | PRN

## 2015-02-27 NOTE — Patient Instructions (Addendum)
Dear Destiny Moore, Thank you for coming in to clinic today. It was good to see you!  1. For your Left shoulder - I think that this is from Chronic Arthritis Pain, and may not fully resolve but we can control your pain better. - Take Hydrocodone 1 tablet ONLY at BEDTIME, do not take during day to avoid falls, given 90 day supply  Recommend to start taking Tylenol Extra Strength 500mg  tabs - take 1 to 2 tabs (max 1000mg  per dose) every 6 hours for pain (take regularly, don't skip a dose for next 3-7 days), max 24 hour daily dose is 6 to 8 tablets or 3000 to 4000mg   Schedule "Geriatrics Clinic Appointment - Dr. McDiarmid"  Please schedule a follow-up appointment with Dr. Althea CharonKaramalegos in 3 months for Left Shoulder Pain.  If you have any other questions or concerns, please feel free to call the clinic to contact me. You may also schedule an earlier appointment if necessary.  However, if your symptoms get significantly worse, please go to the Emergency Department to seek immediate medical attention.  Saralyn PilarAlexander Karamalegos, DO [facility]

## 2015-02-27 NOTE — Progress Notes (Signed)
Subjective:    Patient ID: Destiny Moore, female    DOB: 1933-03-08, 81 y.o.   MRN: [id number]  Destiny Moore is a 81 y.o. female presenting on 02/27/2015 for Shoulder Pain  Patient presents for a same day appointment.  HPI  L SHOULDER / ELBOW PAIN, s/p Fall 10/31/14 - CHRONIC: - Known chronic complaint for past 3+ months, with last evaluation after initial fall injury 10/31/14, x 3 visits in 10/2014 and 12/2014. Had prior ED visit, follow-up Left upper ext x-rays (Shoulder and Humerus, no evidence acute fracture, but noted moderate DJD in left shoulder) - Interval history has also had Shingles on RIGHT side of upper to mid back, not on left. Recently completed HH physical therapy with notable improvement per their discharge report, finished on 02/17/15. - Today presents for follow-up of same complaint. Difficulty in articulating if pain is significantly better, worse or about same. She is with her husband, he clarifies a lot of the history as she remains poor historian. Seems to be functional still, and improved from therapy. Still complains of persistent pain mostly on posterior shoulder aspect over shoulder blade and left elbow and forearm (radiated) pain. No new acute injury, fall, or trauma. No new swelling or bruising. - Taking occasional Tylenol (with good relief), out of chronic hydrocodone previously taking with good relief (did not make drowsy, only took at night), previously discontinued Tramadol, not helping. Also on Gabapentin has been out of this for at least a month, was taking  at night for neuropathy - Occasionally uses cane for ambulation - No recent falls  To get Flu shot at [facility] with husband and get [place] paperwork  Past Medical History  Diagnosis Date  . Hypertension   . Hyperlipidemia   . Anxiety   . UTI (urinary tract infection)   . Depression   . Osteoporosis   . Allergy   . CKD (chronic kidney disease)     stage 3 as of 04/2014.     Social History    Social History  . Marital Status: Married    Spouse Name: Destiny Moore  . Number of Children: 2  . Years of Education: 10   Occupational History  . Retired- Emergency planning/management officer    Social History Main Topics  . Smoking status: Never Smoker   . Smokeless tobacco: Never Used  . Alcohol Use: No  . Drug Use: No  . Sexual Activity: Not on file   Other Topics Concern  . Not on file   Social History Narrative   Health Care POA:    Emergency Contact: Destiny Moore [phone] (h)    End of Life Plan:    Who lives with you: Lives with husband,  step son Destiny Moore   Any pets: 4 cats   Diet: Patient has a varied diet of protein, starch, and vegetables.    Exercise: Patient does not have any regular exercise plan.  Does not like to exercise by her self.   Seatbelts: Patient reports wearing seat belt when in vehicle.   Sun Exposure/Protection: Patient does not wear sun protection.   Hobbies: cooking, traveling, visiting family in [place]                 Current Outpatient Prescriptions on File Prior to Visit  Medication Sig  . acetaminophen (TYLENOL 8 HOUR) 650 MG CR tablet Take 1 tablet (650 mg total) by mouth every 8 (eight) hours as needed for pain.  Marland Kitchen  alendronate (FOSAMAX) 70 MG tablet TAKE 1 TABLET EVERY 7 DAYS WITH A FULL GLASS OF WATER ON AN EMPTY STOMACH  . aspirin 81 MG tablet Take 81 mg by mouth daily.  . B Complex-C (B-COMPLEX WITH VITAMIN C) tablet Take 1 tablet by mouth daily.  . DUREZOL 0.05 % EMUL Place 1 drop into the right eye daily.   Marland Kitchen EPINEPHrine (EPIPEN) 0.3 mg/0.3 mL DEVI Inject 0.3 mg into the muscle as needed (for allergic reaction).   . hydrochlorothiazide (HYDRODIURIL) 25 MG tablet Take 1 tablet (25 mg total) by mouth daily.  . pantoprazole (PROTONIX) 40 MG tablet TAKE 1 TABLET BY MOUTH DAILY BEFORE BREAKFAST.  Marland Kitchen simvastatin (ZOCOR) 20 MG tablet Take 1 tablet (20 mg total) by mouth daily.  . valACYclovir (VALTREX) 1000 MG tablet TAKE 1 TABLET  BY MOUTH TWICE A DAY   Current Facility-Administered Medications on File Prior to Visit  Medication  . nitroGLYCERIN (NITROSTAT) SL tablet 0.3 mg    Review of Systems  Constitutional: Negative for fever, chills, diaphoresis, activity change, appetite change and fatigue.  Cardiovascular: Positive for leg swelling (chronic).  Musculoskeletal: Positive for arthralgias (left posteior shoulder and forearm, elbow). Negative for neck pain.  Skin: Positive for rash (resolving shingles rash on Right upper back and flank).  Neurological: Negative for weakness, numbness and headaches.   Per HPI unless specifically indicated above     Objective:    BP 130/70 mmHg  Pulse 87  Temp(Src) 98.3 F (36.8 C) (Oral)  Ht  (1.575 m)  Wt 144 lb (65.318 kg)  BMI 26.33 kg/m2  SpO2 99%  Wt Readings from Last 3 Encounters:  02/27/15 144 lb (65.318 kg)  01/03/15 145 lb 9.6 oz (66.044 kg)  12/22/14 146 lb 1.6 oz (66.271 kg)    Physical Exam  Constitutional: She is oriented to person, place, and time. She appears well-developed and well-nourished. No distress.  HENT:  Head: Normocephalic and atraumatic.  Neck: Normal range of motion. Neck supple. No thyromegaly present.  Cardiovascular: Normal rate, regular rhythm, normal heart sounds and intact distal pulses.   No murmur heard. Pulmonary/Chest: Effort normal and breath sounds normal. No respiratory distress. She has no wheezes. She has no rales.  Musculoskeletal: She exhibits no edema.       Left shoulder: She exhibits decreased range of motion (Chronic limited forward flexion above shoulder, but intact full internal rotation.), tenderness (No significant anterior tenderness, mostly localized to posterior scapula also left elbow and forearm) and spasm. She exhibits no bony tenderness, no swelling, no crepitus and no deformity.       Left elbow: She exhibits normal range of motion, no swelling, no effusion and no deformity. Tenderness found.        Arms: Negative rotator cuff testing with supraspinatus full and empty can testing, negative impingement with Hawkins test  Lymphadenopathy:    She has no cervical adenopathy.  Neurological: She is alert and oriented to person, place, and time.  Skin: Skin is warm and dry. Rash (resolving but persistent shingles rash on right upper-mid back extending on ribs) noted. She is not diaphoretic.  No ecchymosis  Psychiatric: She has a normal mood and affect. Her behavior is normal. Thought content normal.  Nursing note and vitals reviewed.  Results for orders placed or performed in visit on 12/15/14  BASIC METABOLIC PANEL WITH GFR  Result Value Ref Range   Sodium 145 135 - 146 mmol/L   Potassium 4.1 3.5 - 5.3 mmol/L  Chloride 104 98 - 110 mmol/L   CO2 27 20 - 31 mmol/L   Glucose, Bld 92 65 - 99 mg/dL   BUN 18 7 - 25 mg/dL   Creat 0.451.56 (H) 4.090.60 - 0.93 mg/dL   Calcium 81.110.0 8.6 - 91.410.4 mg/dL   GFR, Est African American 36 (L) >=60 mL/min   GFR, Est Non African American 31 (L) >=60 mL/min      Assessment & Plan:   Problem List Items Addressed This Visit      Musculoskeletal and Integument   Degenerative arthritis of left shoulder region - Primary    Chronic persistent pain (mostly left posterior shoulder blade and elbow/forearm) overall clinically seems better after 3 months from initial fall. Improved function and ROM [facility]. Likely chronic pain in underlying Left shoulder DJD with mod AC DJD. May have some chronic element of adhesive capsulitis with reduced ROM flexion, less likely rotator cuff without weakness or impingement - Last x-ray: Left shoulder and humerus 8/7, no fractures - Continues to inconsistently take medicines, considered inadequate home therapy  Plan: 1. Re-emphasized importance of regular Tylenol use for at least 2-4 weeks, 1000mg  TID regularly 2. Refilled Hydrocodone 5/325mg  take 1 q 8 hr AS NEEDED (advised to only take at bedtime, given #90, 0  refills for 3 mo supply) - caution sedation and fall risk, only bedtime 3. Re-emphasized ROM exercises as demonstrated, heating pad, avoid overuse 4. RTC 3 mo follow-up, may benefit from more HH therapy in future, advised follow up with [facility]armid in [facility] given difficulties taking medicines at home, prior memory concerns - Future consider subacromial steroid injection, baclofen trial for muscle spasm (would likely discontinue hydrocodone) only do qhs      Relevant Medications   HYDROcodone-acetaminophen (NORCO/VICODIN) 5-325 MG tablet   Osteoarthritis of multiple joints   Relevant Medications   HYDROcodone-acetaminophen (NORCO/VICODIN) 5-325 MG tablet     Other   Bilateral lower extremity pain    Refilled Gabapentin 100mg  THREE TIMES A DAY - trial, otherwise can return to 300mg  at night.      Relevant Medications   gabapentin (NEURONTIN) 100 MG capsule      Meds ordered this encounter  Medications  . HYDROcodone-acetaminophen (NORCO/VICODIN) 5-325 MG tablet    Sig: Take 1 tablet by mouth every 8 (eight) hours as needed for moderate pain.    Dispense:  90 tablet    Refill:  0  . gabapentin (NEURONTIN) 100 MG capsule    Sig: Take 1 capsule (100 mg total) by mouth 3 (three) times daily.    Dispense:  90 capsule    Refill:  5      Follow up plan: Return in about 3 months (around 05/30/2015) for arthritis.  A total of 15 minutes was spent face-to-face with this patient. Over half this time was spent on counseling patient on the diagnosis and different diagnostic and therapeutic options available.  Saralyn PilarAlexander Malikai Gut, DO [facility], PGY-3

## 2015-02-28 NOTE — Assessment & Plan Note (Addendum)
Chronic persistent pain (mostly left posterior shoulder blade and elbow/forearm) overall clinically seems better after 3 months from initial fall. Improved function and ROM [facility]. Likely chronic pain in underlying Left shoulder DJD with mod AC DJD. May have some chronic element of adhesive capsulitis with reduced ROM flexion, less likely rotator cuff without weakness or impingement - Last x-ray: Left shoulder and humerus 8/7, no fractures - Continues to inconsistently take medicines, considered inadequate home therapy  Plan: 1. Re-emphasized importance of regular Tylenol use for at least 2-4 weeks, 1000mg  TID regularly 2. Refilled Hydrocodone 5/325mg  take 1 q 8 hr AS NEEDED (advised to only take at bedtime, given #90, 0 refills for 3 mo supply) - caution sedation and fall risk, only bedtime 3. Re-emphasized ROM exercises as demonstrated, heating pad, avoid overuse 4. RTC 3 mo follow-up, may benefit from more HH therapy in future, advised follow up with Dr [facility]armid in [facility] given difficulties taking medicines at home, prior memory concerns - Future consider subacromial steroid injection, baclofen trial for muscle spasm (would likely discontinue hydrocodone) only do qhs

## 2015-02-28 NOTE — Assessment & Plan Note (Signed)
Refilled Gabapentin 100mg  THREE TIMES A DAY - trial, otherwise can return to 300mg  at night.

## 2015-03-05 ENCOUNTER — Ambulatory Visit: Payer: Medicare Other

## 2015-03-07 ENCOUNTER — Encounter (HOSPITAL_COMMUNITY): Payer: Self-pay | Admitting: *Deleted

## 2015-03-07 ENCOUNTER — Ambulatory Visit (INDEPENDENT_AMBULATORY_CARE_PROVIDER_SITE_OTHER): Payer: Medicare Other | Admitting: Family Medicine

## 2015-03-07 ENCOUNTER — Emergency Department (HOSPITAL_COMMUNITY)
Admission: EM | Admit: 2015-03-07 | Discharge: 2015-03-07 | Disposition: A | Discharge status: Home / Self Care | Payer: Medicare Other | Attending: Emergency Medicine | Admitting: Emergency Medicine

## 2015-03-07 ENCOUNTER — Emergency Department (HOSPITAL_COMMUNITY): Payer: Medicare Other

## 2015-03-07 VITALS — BP 130/80 | HR 72 | Temp 98.2°F | Resp 16

## 2015-03-07 DIAGNOSIS — Z79899 Other long term (current) drug therapy: Secondary | ICD-10-CM | POA: Insufficient documentation

## 2015-03-07 DIAGNOSIS — Z87440 Personal history of urinary (tract) infections: Secondary | ICD-10-CM | POA: Diagnosis not present

## 2015-03-07 DIAGNOSIS — Z7982 Long term (current) use of aspirin: Secondary | ICD-10-CM | POA: Insufficient documentation

## 2015-03-07 DIAGNOSIS — I129 Hypertensive chronic kidney disease with stage 1 through stage 4 chronic kidney disease, or unspecified chronic kidney disease: Secondary | ICD-10-CM | POA: Diagnosis not present

## 2015-03-07 DIAGNOSIS — M25512 Pain in left shoulder: Secondary | ICD-10-CM

## 2015-03-07 DIAGNOSIS — N183 Chronic kidney disease, stage 3 (moderate): Secondary | ICD-10-CM | POA: Diagnosis not present

## 2015-03-07 DIAGNOSIS — M810 Age-related osteoporosis without current pathological fracture: Secondary | ICD-10-CM | POA: Insufficient documentation

## 2015-03-07 DIAGNOSIS — F329 Major depressive disorder, single episode, unspecified: Secondary | ICD-10-CM | POA: Diagnosis not present

## 2015-03-07 DIAGNOSIS — R079 Chest pain, unspecified: Secondary | ICD-10-CM | POA: Diagnosis not present

## 2015-03-07 DIAGNOSIS — E785 Hyperlipidemia, unspecified: Secondary | ICD-10-CM | POA: Diagnosis not present

## 2015-03-07 DIAGNOSIS — F419 Anxiety disorder, unspecified: Secondary | ICD-10-CM | POA: Diagnosis not present

## 2015-03-07 DIAGNOSIS — R519 Headache, unspecified: Secondary | ICD-10-CM

## 2015-03-07 DIAGNOSIS — M19012 Primary osteoarthritis, left shoulder: Secondary | ICD-10-CM

## 2015-03-07 DIAGNOSIS — M79602 Pain in left arm: Secondary | ICD-10-CM

## 2015-03-07 DIAGNOSIS — R208 Other disturbances of skin sensation: Secondary | ICD-10-CM

## 2015-03-07 DIAGNOSIS — R200 Anesthesia of skin: Secondary | ICD-10-CM

## 2015-03-07 DIAGNOSIS — R0600 Dyspnea, unspecified: Secondary | ICD-10-CM | POA: Diagnosis not present

## 2015-03-07 DIAGNOSIS — R51 Headache: Secondary | ICD-10-CM

## 2015-03-07 DIAGNOSIS — M546 Pain in thoracic spine: Secondary | ICD-10-CM | POA: Insufficient documentation

## 2015-03-07 LAB — BASIC METABOLIC PANEL
Anion gap: 15 (ref 5–15)
BUN: 17 mg/dL (ref 6–20)
CO2: 24 mmol/L (ref 22–32)
CREATININE: 1.51 mg/dL — AB (ref 0.44–1.00)
Calcium: 9.9 mg/dL (ref 8.9–10.3)
Chloride: 100 mmol/L — ABNORMAL LOW (ref 101–111)
GFR calc non Af Amer: 31 mL/min — ABNORMAL LOW (ref >60)
GFR, EST AFRICAN AMERICAN: 36 mL/min — AB (ref >60)
Glucose, Bld: 102 mg/dL — ABNORMAL HIGH (ref 65–99)
Potassium: 3.5 mmol/L (ref 3.5–5.1)
Sodium: 139 mmol/L (ref 135–145)

## 2015-03-07 LAB — URINALYSIS, ROUTINE W REFLEX MICROSCOPIC
Bilirubin Urine: NEGATIVE
Glucose, UA: NEGATIVE mg/dL
Hgb urine dipstick: NEGATIVE
Ketones, ur: NEGATIVE mg/dL
Nitrite: NEGATIVE
PROTEIN: NEGATIVE mg/dL
Specific Gravity, Urine: 1.006 (ref 1.005–1.030)
UROBILINOGEN UA: 0.2 mg/dL (ref 0.0–1.0)
pH: 7 (ref 5.0–8.0)

## 2015-03-07 LAB — URINE MICROSCOPIC-ADD ON

## 2015-03-07 LAB — CBC
HCT: 37.8 % (ref 36.0–46.0)
Hemoglobin: 12.7 g/dL (ref 12.0–15.0)
MCH: 31.2 pg (ref 26.0–34.0)
MCHC: 33.6 g/dL (ref 30.0–36.0)
MCV: 92.9 fL (ref 78.0–100.0)
PLATELETS: 258 10*3/uL (ref 150–400)
RBC: 4.07 MIL/uL (ref 3.87–5.11)
RDW: 15.2 % (ref 11.5–15.5)
WBC: 6.4 10*3/uL (ref 4.0–10.5)

## 2015-03-07 LAB — TROPONIN I: Troponin I: <0.03 ng/mL (ref <0.031)

## 2015-03-07 MED ORDER — ACETAMINOPHEN 500 MG PO TABS: 500.0000 mg | ORAL_TABLET | Freq: Four times a day (QID) | ORAL | Status: AC | PRN

## 2015-03-07 MED ORDER — ACETAMINOPHEN 325 MG PO TABS
650.0000 mg | ORAL_TABLET | Freq: Once | ORAL | Status: AC
  Administered 2015-03-07: 650 mg via ORAL
  Filled 2015-03-07: qty 2

## 2015-03-07 NOTE — Patient Instructions (Signed)
Your left shoulder pain appears to be similar to what you've had in the past. However with the chest pain and shortness of breath along with the pain with breathing, you will need further evaluation through the emergency room. Once you have been evaluated and released from the emergency room or hospital, follow-up with your primary care provider or return here if needed in the meantime.

## 2015-03-07 NOTE — Discharge Instructions (Signed)
  Arthritis Arthritis means joint pain. It can also mean joint disease. A joint is a place where bones come together. People who have arthritis may have:  Red joints.  Swollen joints.  Stiff joints.  Warm joints.  A fever.  A feeling of being sick. HOME CARE Pay attention to any changes in your symptoms. Take these actions to help with your pain and swelling. Medicines  Take over-the-counter and prescription medicines only as told by your doctor.  Do not take aspirin for pain if your doctor says that you may have gout. Activities  Rest your joint if your doctor tells you to.  Avoid activities that make the pain worse.  Exercise your joint regularly as told by your doctor. Try doing exercises like:  Swimming.  Water aerobics.  Biking.  Walking. Joint Care  If your joint is swollen, keep it raised (elevated) if told by your doctor.  If your joint feels stiff in the morning, try taking a warm shower.  If you have diabetes, do not apply heat without asking your doctor.  If told, apply heat to the joint:  Put a towel between the joint and the hot pack or heating pad.  Leave the heat on the area for 20-30 minutes.  If told, apply ice to the joint:  Put ice in a plastic bag.  Place a towel between your skin and the bag.  Leave the ice on for 20 minutes, 2-3 times per day.  Keep all follow-up visits as told by your doctor. GET HELP IF:  The pain gets worse.  You have a fever. GET HELP RIGHT AWAY IF:  You have very bad pain in your joint.  You have swelling in your joint.  Your joint is red.  Many joints become painful and swollen.  You have very bad back pain.  Your leg is very weak.  You cannot control your pee (urine) or poop (stool).   This information is not intended to replace advice given to you by your health care provider. Make sure you discuss any questions you have with your health care provider.   Document Released: 07/13/2009  Document Revised: 01/07/2015 Document Reviewed: 07/14/2014 Elsevier Interactive Patient Education 2016 [employer]  

## 2015-03-07 NOTE — ED Notes (Signed)
Pt ambulated to the restroom with stand by assist in a steady manner

## 2015-03-07 NOTE — ED Notes (Signed)
Pt was at Urgent Care and had chest pains x 2 weeks with diaphoresis, SOB and weakness.  Pt originally went to urgent care for pain in her left arm and shoulder x 2 weeks with no known injury. Pt states she has increasing pain in left shoulder and left arm.  Pt also C/O HA in forehead since today.  [facility] brought pt in with vitals. 140/72, 75, 99%, 16 resp.

## 2015-03-07 NOTE — ED Provider Notes (Signed)
CSN: [id number]     Arrival date & time 03/07/15  1550 History   First MD Initiated Contact with Patient 03/07/15 1612     Chief Complaint  Patient presents with  . Chest Pain  . Shoulder Pain     (Consider location/radiation/quality/duration/timing/severity/associated sxs/prior Treatment) HPI Destiny Moore is a 81 y.o. female with PMH significant for HTN, HLD, anxiety, depression, anxiety, CKD who presents with left sided, non-radiating, chest pain, left upper back pain, and left arm weakness.  She reports this began about 2 weeks ago and has been "coming and going".  She states she has difficulty using her left arm and opening things.  She has not tried anything to make it better.  No exertional component to her chest pain.  Nothing like this has happened before.  She saw her PCP 2 weeks ago and she reports she was told she had arthritis in her shoulder.  She reports no injury.  Associated sxs include intermittent episodes of diaphoresis and nausea, intermittent SOB, weakness, and dark urine.  Denies hematuria, bloody stools, dizziness, lightheadedness, slurred speech, or facial droop.   Past Medical History  Diagnosis Date  . Hypertension   . Hyperlipidemia   . Anxiety   . UTI (urinary tract infection)   . Depression   . Osteoporosis   . Allergy   . CKD (chronic kidney disease)     stage 3 as of 04/2014.    Past Surgical History  Procedure Laterality Date  . Hystrectomy    . Bladder tact    . Abdominal hysterectomy    . Incontinence surgery    . Cholecystectomy N/A 05/08/2014    Procedure: LAPAROSCOPIC CHOLECYSTECTOMY WITH INTRAOPERATIVE CHOLANGIOGRAM;  Surgeon: Manus Rudd, MD;  Location: [facility] OR;  Service: General;  Laterality: N/A;   Family History  Problem Relation Age of Onset  . Heart disease Mother   . Cancer Sister     breast  . Cancer Brother     skin, kidney, lung   Social History  Substance Use Topics  . Smoking status: Never Smoker   . Smokeless tobacco:  Never Used  . Alcohol Use: No   OB History    No data available     Review of Systems All other systems negative unless otherwise stated in HPI    Allergies  Carvedilol; Coreg; Lisinopril; and Sertraline hcl  Home Medications   Prior to Admission medications   Medication Sig Start Date End Date Taking? Authorizing Provider  amLODipine (NORVASC) 10 MG tablet Take 10 mg by mouth daily. 01/07/15  Yes Historical Provider, MD  Ascorbic Acid (VITAMIN C PO) Take 1 tablet by mouth daily.   Yes Historical Provider, MD  aspirin EC 81 MG tablet Take 81 mg by mouth daily.   Yes Historical Provider, MD  cholecalciferol (VITAMIN D) 1000 UNITS tablet Take 1,000 Units by mouth daily.   Yes Historical Provider, MD  CRANBERRY PO Take 1 tablet by mouth daily.   Yes Historical Provider, MD  Cyanocobalamin (VITAMIN B-12 PO) Take 1 tablet by mouth daily.   Yes Historical Provider, MD  EPINEPHrine (EPIPEN) 0.3 mg/0.3 mL DEVI Inject 0.3 mg into the muscle as needed (for allergic reaction).    Yes Historical Provider, MD  gabapentin (NEURONTIN) 100 MG capsule Take 1 capsule (100 mg total) by mouth 3 (three) times daily. Patient taking differently: Take 300 mg by mouth at bedtime.  02/27/15  Yes Alexander Fredric Mare, DO  hydrochlorothiazide (MICROZIDE) 12.5 MG capsule Take  12.5 mg by mouth daily. 01/07/15  Yes Historical Provider, MD  HYDROcodone-acetaminophen (NORCO/VICODIN) 5-325 MG tablet Take 1 tablet by mouth every 8 (eight) hours as needed for moderate pain. 02/27/15  Yes Alexander J Karamalegos, DO  OVER THE COUNTER MEDICATION Place 1 drop into both eyes 2 (two) times daily as needed (dry eyes/burning). Over the counter lubricating eye drops   Yes Historical Provider, MD  pantoprazole (PROTONIX) 40 MG tablet TAKE 1 TABLET BY MOUTH DAILY BEFORE BREAKFAST. Patient taking differently: Take 40 mg by mouth daily.  11/17/14  Yes Alexander Fredric MareJ Karamalegos, DO  simvastatin (ZOCOR) 20 MG tablet Take 1 tablet (20 mg  total) by mouth daily. Patient taking differently: Take 20 mg by mouth at bedtime.  11/17/14  Yes Alexander Fredric MareJ Karamalegos, DO  acetaminophen (TYLENOL) 500 MG tablet Take 1 tablet (500 mg total) by mouth every 6 (six) hours as needed. 03/07/15   Destiny FowlerKayla Tracee Mccreery, PA-C  alendronate (FOSAMAX) 70 MG tablet TAKE 1 TABLET EVERY 7 DAYS WITH A FULL GLASS OF WATER ON AN EMPTY STOMACH Patient taking differently: Take 70 mg by mouth once a week. TAKE 1 TABLET EVERY 7 DAYS WITH A FULL GLASS OF WATER ON AN EMPTY STOMACH - ON MONDAYS 02/25/15   Smitty CordsAlexander J Karamalegos, DO  hydrochlorothiazide (HYDRODIURIL) 25 MG tablet Take 1 tablet (25 mg total) by mouth daily. Patient not taking: Reported on 03/07/2015 12/22/14   Netta NeatAlexander J Karamalegos, DO   BP 132/56 mmHg  Pulse 80  Resp 16  SpO2 95% Physical Exam  Constitutional: She is oriented to person, place, and time. She appears well-developed and well-nourished.  HENT:  Head: Normocephalic and atraumatic.  Mouth/Throat: Oropharynx is clear and moist.  Eyes: Conjunctivae are normal. Pupils are equal, round, and reactive to light.  Neck: Normal range of motion. Neck supple.  Cardiovascular: Normal rate, regular rhythm and normal heart sounds.   No murmur heard. Pulses:      Radial pulses are 2+ on the right side, and 2+ on the left side.  Pulmonary/Chest: Effort normal and breath sounds normal. No accessory muscle usage or stridor. No respiratory distress. She has no wheezes. She has no rhonchi. She has no rales.  Abdominal: Soft. Bowel sounds are normal. She exhibits no distension. There is no tenderness.  Musculoskeletal: Normal range of motion.  Reproducible pain along left scapula.  TTP.  No deformities, ecchymosis, or erythema. Decreased ROM of left shoulder.   5/5 strength in shoulders, elbow, and right grip strength.  4/5 grip strength in left hand.  Sensation intact bilaterally.   Pain with supination and pronation.  Lymphadenopathy:    She has no cervical  adenopathy.  Neurological: She is alert and oriented to person, place, and time.  Speech clear without dysarthria.  Skin: Skin is warm and dry.  Psychiatric: She has a normal mood and affect. Her behavior is normal.    ED Course  Procedures (including critical care time) Labs Review Labs Reviewed  BASIC METABOLIC PANEL - Abnormal; Notable for the following:    Chloride 100 (*)    Glucose, Bld 102 (*)    Creatinine, Ser 1.51 (*)    GFR calc non Af Amer 31 (*)    GFR calc Af Amer 36 (*)    All other components within normal limits  URINALYSIS, ROUTINE W REFLEX MICROSCOPIC (NOT AT [facility]) - Abnormal; Notable for the following:    Leukocytes, UA MODERATE (*)    All other components within normal limits  CBC  TROPONIN I  URINE MICROSCOPIC-ADD ON  TROPONIN I    Imaging Review Dg Chest 2 View  03/07/2015  CLINICAL DATA:  Pt was at Urgent Care and had chest pains x 2 weeks with diaphoresis, SOB and weakness. Pt originally went to urgent care for pain in her left arm and shoulder x 2 weeks with no known injury. Pt states she has increasing pain. EXAM: CHEST  2 VIEW COMPARISON:  CT chest 12/07/2014 FINDINGS: The heart size and mediastinal contours are within normal limits. Both lungs are clear. The visualized skeletal structures are unremarkable. IMPRESSION: No active cardiopulmonary disease. Electronically Signed   By: Elige Ko   On: 03/07/2015 17:06   Dg Shoulder Left  03/07/2015  CLINICAL DATA:  Left shoulder pain.  No known injury. EXAM: LEFT SHOULDER - 2+ VIEW COMPARISON:  12/07/2014 left shoulder radiographs. FINDINGS: No fracture, Hill-Sachs deformity or dislocation. No suspicious focal osseous lesion. Moderate left acromioclavicular joint osteoarthritis, stable. Normal left glenohumeral joint. No pathologic soft tissue calcifications. IMPRESSION: Stable left shoulder radiographs with moderate left AC joint osteoarthritis. No fracture or malalignment. Electronically Signed   By: Delbert Phenix M.D.   On: 03/07/2015 17:08   I have personally reviewed and evaluated these images and lab results as part of my medical decision-making.   EKG Interpretation   Date/Time:  Saturday March 07 2015 16:04:54 EDT Ventricular Rate:  72 PR Interval:  154 QRS Duration: 88 QT Interval:  403 QTC Calculation: 441 R Axis:   17 Text Interpretation:  Sinus rhythm No significant change since last  tracing Confirmed by Denton Lank  MD, Caryn Bee ([id number]) on 03/07/2015 4:33:17 PM      MDM   Final diagnoses:  Arthritis of left shoulder region    VSS, NAD.  On exam, TTP along left scapula.  Decreased ROM of left shoulder.  5/5 strength in left shoulder.  Sensation intact.  Intact distal pulses.  Pain with left arm supination and pronation.  No exertional component.  Suspect musculoskeletal etiology.  Will perform cardiac workup given patient's age, HTN, CKD and possible abnormal presentation of cardiac event.  HEART score 3.  Will delta troponin.    UA, BMP, CBC unremarkable.  Troponin x 1 - < 0.03.  Troponin x 2 -- 0.03.  Doubt cardiac etiology.   CXR shows no active cardiopulmonary disease. Left shoulder xray shows stable left   Evaluation does not show pathology requring ongoing emergent intervention or admission. Pt is hemodynamically stable and mentating appropriately. Discussed findings/results and plan with patient/guardian, who agrees with plan. All questions answered. Return precautions discussed and outpatient follow up given.   Case has been discussed with and seen by Dr. Denton Lank who agrees with the above plan for discharge.    Destiny Fowler, PA-C 03/07/15 2202  Cathren Laine, MD 03/07/15 [phone]

## 2015-03-07 NOTE — Progress Notes (Signed)
Subjective:  This chart was scribed for Destiny StaggersJeffrey Emet Rafanan, MD by Andrew Auaven Small, ED Scribe. This patient was seen in room 6 and the patient's care was started at 2:14 PM.  Patient ID: Destiny LoraEvelyn L Moore, female    DOB: March 15, 1933, 81 y.o.   MRN: [id number]  HPI . Chief Complaint  Patient presents with  . Numbness    left side x a few weeks   HPI Comments: Destiny Loravelyn L Stencel is a 81 y.o. female who presents to the [facility] complaining of left shoulder pain . PCP Saralyn PilarAlexander Karamalegos, DO Initially brought back acutely for suspect numbness to left side. I initially screened her symptoms and determined them to be not acute. Denies slurred speech or new focal weakness, denies facial droop . Last seen by PCP 1 week ago for arthritis of left shoulder and multiple joints. By his note left shoulder pain has been a complaint for 3+ months after fall in July. Negative XR of shoulder and humerus. Moderate  AC joint osteoarthritis. Recent shingles on right back, not left. Home health PT with improvement finish 10/18. Persistent pain from the left shoulder through elbow and arm at that visit. Was out of gabapentin and norco at that visit. Differential of adhesive capsulitis. Reccommended tylenol 1000 mg TID scheduled, Norco Q8 prn with sedation of fall risk and ROM.  Also advised geriatric clinic f/u with concerns of taking medication at home an memory and trial of gabapentin 100 mg TID as opposed to 300 mg qhs.     Pt states left shoulder pain worsened last night and this morning. No new falls or recent injury. She also has left arm pain and numbness since onset of shoulder pain,  left CP that began 1 week ago, new SOB that began when she woke up, a new frontal HA that began while waiting to be seen and nausea. No blurred of double vision. She takes hydrocodone only as needed, and took 1 last night and this morning. She also takes 3 tablets of gabapentin at night before bed. No hx of MI. BP is  elevated today.   Patient Active Problem List   Diagnosis Date Noted  . Shingles 01/15/2015  . Acute cystitis 12/22/2014  . Bilateral lower extremity edema 12/22/2014  . Other chest pain 12/09/2014  . Multiple thyroid nodules 12/09/2014  . Urinary incontinence 11/13/2014  . Degenerative arthritis of left shoulder region 11/13/2014  . Recurrent falls 11/06/2014    Class: Acute  . Epistaxis 08/01/2014  . S/P laparoscopic cholecystectomy 05/14/2014  . Memory loss 05/14/2014  . Common bile duct dilation   . Calculus of bile duct with obstruction and without cholangitis or cholecystitis   . Abdominal pain 05/05/2014  . Biliary stone 05/05/2014  . RUQ abdominal pain 05/05/2014  . GERD (gastroesophageal reflux disease) 04/17/2014  . Cholelithiasis 04/17/2014  . Overactive bladder 09/20/2013  . Bilateral lower extremity pain 08/21/2013  . Constipation 01/20/2011  . Abnormal gait 01/20/2011  . Depression 07/14/2010  . CKD (chronic kidney disease) stage 3, GFR 30-59 ml/min 06/04/2010  . UNSPECIFIED GENITAL PROLAPSE 05/27/2010  . Hyperlipidemia 06/17/2009  . PTSD 06/17/2009  . Essential hypertension 06/17/2009  . Osteoarthritis of multiple joints 06/17/2009  . Osteoporosis 06/17/2009   Past Medical History  Diagnosis Date  . Hypertension   . Hyperlipidemia   . Anxiety   . UTI (urinary tract infection)   . Depression   . Osteoporosis   . Allergy   . CKD (chronic  kidney disease)     stage 3 as of 04/2014.    Past Surgical History  Procedure Laterality Date  . Hystrectomy    . Bladder tact    . Abdominal hysterectomy    . Incontinence surgery    . Cholecystectomy N/A 05/08/2014    Procedure: LAPAROSCOPIC CHOLECYSTECTOMY WITH INTRAOPERATIVE CHOLANGIOGRAM;  Surgeon: Manus Rudd, MD;  Location: [facility] OR;  Service: General;  Laterality: N/A;   Allergies  Allergen Reactions  . Coreg Anaphylaxis  . Lisinopril Anaphylaxis    REACTION: Anaphylaxis  . Sertraline Hcl Other (See  Comments)    Could not feed herself  . Carvedilol Other (See Comments)    Unknown    Prior to Admission medications   Medication Sig Start Date End Date Taking? Authorizing Provider  acetaminophen (TYLENOL 8 HOUR) 650 MG CR tablet Take 1 tablet (650 mg total) by mouth every 8 (eight) hours as needed for pain. 11/06/14  Yes Almon Hercules, MD  alendronate (FOSAMAX) 70 MG tablet TAKE 1 TABLET EVERY 7 DAYS WITH A FULL GLASS OF WATER ON AN EMPTY STOMACH 02/25/15  Yes Smitty Cords, DO  aspirin 81 MG tablet Take 81 mg by mouth daily.   Yes Historical Provider, MD  B Complex-C (B-COMPLEX WITH VITAMIN C) tablet Take 1 tablet by mouth daily.   Yes Historical Provider, MD  gabapentin (NEURONTIN) 100 MG capsule Take 1 capsule (100 mg total) by mouth 3 (three) times daily. 02/27/15  Yes Alexander J Karamalegos, DO  hydrochlorothiazide (HYDRODIURIL) 25 MG tablet Take 1 tablet (25 mg total) by mouth daily. 12/22/14  Yes Alexander J Karamalegos, DO  pantoprazole (PROTONIX) 40 MG tablet TAKE 1 TABLET BY MOUTH DAILY BEFORE BREAKFAST. 11/17/14  Yes Alexander Fredric Mare, DO  simvastatin (ZOCOR) 20 MG tablet Take 1 tablet (20 mg total) by mouth daily. 11/17/14  Yes Alexander Fredric Mare, DO  valACYclovir (VALTREX) 1000 MG tablet TAKE 1 TABLET BY MOUTH TWICE A DAY 01/19/15  Yes Alexander J Karamalegos, DO  DUREZOL 0.05 % EMUL Place 1 drop into the right eye daily.  03/11/14   Historical Provider, MD  EPINEPHrine (EPIPEN) 0.3 mg/0.3 mL DEVI Inject 0.3 mg into the muscle as needed (for allergic reaction).     Historical Provider, MD  HYDROcodone-acetaminophen (NORCO/VICODIN) 5-325 MG tablet Take 1 tablet by mouth every 8 (eight) hours as needed for moderate pain. Patient not taking: Reported on 03/07/2015 02/27/15   Smitty Cords, DO   Social History   Social History  . Marital Status: Married    Spouse Name: [name]  . Number of Children: 2  . Years of Education: 10   Occupational  History  . Retired- Emergency planning/management officer    Social History Main Topics  . Smoking status: Never Smoker   . Smokeless tobacco: Never Used  . Alcohol Use: No  . Drug Use: No  . Sexual Activity: Not on file   Other Topics Concern  . Not on file   Social History Narrative   Health Care POA:    Emergency Contact: Garnette Gunner [phone] (h)    End of Life Plan:    Who lives with you: Lives with husband,  step son Subrena Devereux   Any pets: 4 cats   Diet: Patient has a varied diet of protein, starch, and vegetables.    Exercise: Patient does not have any regular exercise plan.  Does not like to exercise by her self.   Seatbelts: Patient reports  wearing seat belt when in vehicle.   Sun Exposure/Protection: Patient does not wear sun protection.   Hobbies: cooking, traveling, visiting family in [place]                Review of Systems  Eyes: Negative for visual disturbance.  Respiratory: Positive for shortness of breath.   Cardiovascular: Positive for chest pain.  Gastrointestinal: Positive for nausea.  Musculoskeletal: Positive for arthralgias.  Neurological: Positive for headaches. Negative for facial asymmetry and speech difficulty.   Objective:   Physical Exam  Constitutional: She is oriented to person, place, and time. She appears well-developed and well-nourished. No distress.  HENT:  Head: Normocephalic and atraumatic.  Eyes: Conjunctivae and EOM are normal.  Neck: Neck supple.  Cardiovascular: Normal rate, regular rhythm and normal heart sounds.   No murmur heard. Splint slihgtly with inspiration complaining of left shoulder pain.   Pulmonary/Chest: Effort normal.  Appears uncomfortable but responding appropriately.  Pain is worsened with deep breaths on anterior left side. She points to left chest. Able to reproduce left chest wall pain.  Musculoskeletal: Normal range of motion.  Diminished grips strength on left otherwise appears to have equal strength in left  arm.   cspine no focal tenderness.  Somewhat gaurded left shoulder exam. No bony tenderness to left arm.  Neurological: She is alert and oriented to person, place, and time.  Skin: Skin is warm and dry. No rash noted.  Psychiatric: She has a normal mood and affect. Her behavior is normal.  Nursing note and vitals reviewed.  EKG reading- sinus rhythm rate -77. Some base line artifact otherwise no apparent changes from 12/11/14    Assessment & Plan:   2:48 PM -Paced on monitor. Locates pain to center of her chest with with deep inspiration. No hx DVT/ PE. No recent travels or surgeries. -EMS called for transport   3:05 PM -advised charge nurse at [facility]  3:16 PM -EMS has arrived, with report given and transfer of care.    KRISTELL WOODING is a 81 y.o. female Left shoulder pain  Left arm pain  Chest pain, unspecified chest pain type - Plan: EKG 12-Lead  Dyspnea  Nonintractable headache, unspecified chronicity pattern, unspecified headache type  Left arm numbness - Plan: EKG 12-Lead  Based on chart review, appears to have chronic left shoulder pain, with radiation to left arm. Initially brought back emergently due to concern for possible stroke, but she denied any acute symptoms with numbness, or weakness. On evaluation, now endorses symptoms of left-sided and center chest pain for the past week, pain worsened with inspiration. Also short of breath since wakening.  Complains of frontal headache with nausea since in our office. She does have slight decreased grip strength on left hand, but no other focal weakness noted on exam ( but somewhat limited due to pain in shoulder)  - No apparent acute findings on EKG, but with pleuritic chest pain, accompanied by shortness of breath today, will have further evaluation through [facility] ER.  - Placed on monitor, initial attempted IV unsuccessful.  -eval through [facility].     No orders of the defined types were placed in this encounter.     Patient Instructions  Your left shoulder pain appears to be similar to what you've had in the past. However with the chest pain and shortness of breath along with the pain with breathing, you will need further evaluation through the emergency room. Once you have been evaluated and released from the  emergency room or hospital, follow-up with your primary care provider or return here if needed in the meantime.      I personally performed the services described in this documentation, which was scribed in my presence. The recorded information has been reviewed and considered, and addended by me as needed.     By signing my name below, I, Raven Small, attest that this documentation has been prepared under the direction and in the presence of Destiny Staggers, MD.  Electronically Signed: Andrew Au, ED Scribe. 03/07/2015. 2:56 PM.

## 2015-03-13 ENCOUNTER — Other Ambulatory Visit: Payer: Self-pay | Admitting: Family Medicine

## 2015-03-13 DIAGNOSIS — E785 Hyperlipidemia, unspecified: Secondary | ICD-10-CM

## 2015-03-18 ENCOUNTER — Telehealth: Payer: Self-pay | Admitting: Cardiovascular Disease

## 2015-03-18 ENCOUNTER — Encounter: Payer: Self-pay | Admitting: Cardiology

## 2015-03-18 ENCOUNTER — Ambulatory Visit (INDEPENDENT_AMBULATORY_CARE_PROVIDER_SITE_OTHER): Payer: Medicare Other | Admitting: Cardiology

## 2015-03-18 VITALS — BP 144/72 | HR 69 | Ht 62.0 in | Wt 140.0 lb

## 2015-03-18 DIAGNOSIS — I10 Essential (primary) hypertension: Secondary | ICD-10-CM | POA: Diagnosis not present

## 2015-03-18 DIAGNOSIS — M25512 Pain in left shoulder: Secondary | ICD-10-CM | POA: Diagnosis not present

## 2015-03-18 DIAGNOSIS — R079 Chest pain, unspecified: Secondary | ICD-10-CM

## 2015-03-18 MED ORDER — TRAMADOL HCL 50 MG PO TABS: ORAL_TABLET | ORAL | Status: DC

## 2015-03-18 NOTE — Telephone Encounter (Signed)
Spoke with pt and she states that she has been having CP, left arm pain and SOB continuous x 2 weeks. Pt denies lightheadedness or dizziness. Pt states that she "feels like someone is sitting on her chest." Pt was seen in ED on 11/5 and it was thought to be arthritis of the left shoulder. Spoke with Nada BoozerLaura Ingold, NP and she said ok to add pt to schedule today. Spoke with pt and her husband with her permission and made them aware that Vernona RiegerLaura said ok to be seen in office. Scheduled appt for 2pm. Both parties verbalized understanding and were in agreement with this plan.

## 2015-03-18 NOTE — Progress Notes (Signed)
Cardiology Office Note   Date:  03/18/2015   ID:  [name], DOB Sep 28, 1932, MRN [id number]  PCP:  Saralyn Pilar, DO  Cardiologist:  Dr. Elease Hashimoto     Chief Complaint  Patient presents with  . Hospitalization Follow-up    for lt chest pain and Lt shoulder pain      History of Present Illness: Destiny Moore is a 81 y.o. female who presents after calling in for chest pain and SOB.  She was seen in ER at [facility] 03/07/15 and EKG stable, troponin negative, + muscular skeletal pain, she had also had CT of chest for the same or similar pain in August no fx ribs.  CXR was stable without acute findings.  Lt shoulder film with moderate left AC joint osteoarthritis. No fracture or malalignment.  Her pain waxes and wanes but is mostly there, Lt ant chest to axilla and into Lt shoulder and Lt arm.  Increased pain with deep breath and movement.  Some SOB when pain is bad and lasts a long time.  No nausea or diaphoresis.    She has hx of normal nuc study in 2001 and 2011,  Last echo 2012 with EF 55-60% and G1DD.  BP has been stable. No hx of CAD, + HTN, hyperlipidemia and mild rt carotid stenosis.    Past Medical History  Diagnosis Date  . Hypertension   . Hyperlipidemia   . Anxiety   . UTI (urinary tract infection)   . Depression   . Osteoporosis   . Allergy   . CKD (chronic kidney disease)     stage 3 as of 04/2014.     Past Surgical History  Procedure Laterality Date  . Hystrectomy    . Bladder tact    . Abdominal hysterectomy    . Incontinence surgery    . Cholecystectomy N/A 05/08/2014    Procedure: LAPAROSCOPIC CHOLECYSTECTOMY WITH INTRAOPERATIVE CHOLANGIOGRAM;  Surgeon: Manus Rudd, MD;  Location: [facility] OR;  Service: General;  Laterality: N/A;     Current Outpatient Prescriptions  Medication Sig Dispense Refill  . acetaminophen (TYLENOL) 500 MG tablet Take 1 tablet (500 mg total) by mouth every 6 (six) hours as needed. 30 tablet 0  . alendronate (FOSAMAX) 70 MG  tablet Take 70 mg by mouth every Monday. Take with a full glass of water on an empty stomach.    Marland Kitchen amLODipine (NORVASC) 10 MG tablet Take 10 mg by mouth daily.    . Ascorbic Acid (VITAMIN C PO) Take 1 tablet by mouth daily.    Marland Kitchen aspirin EC 81 MG tablet Take 81 mg by mouth daily.    . cholecalciferol (VITAMIN D) 1000 UNITS tablet Take 1,000 Units by mouth daily.    Marland Kitchen CRANBERRY PO Take 1 tablet by mouth daily.    . Cyanocobalamin (VITAMIN B-12 PO) Take 1 tablet by mouth daily.    Marland Kitchen EPINEPHrine (EPIPEN) 0.3 mg/0.3 mL DEVI Inject 0.3 mg into the muscle as needed (for allergic reaction).     . gabapentin (NEURONTIN) 100 MG capsule Take 300 mg by mouth at bedtime.    . hydrochlorothiazide (MICROZIDE) 12.5 MG capsule Take 12.5 mg by mouth daily.    Marland Kitchen HYDROcodone-acetaminophen (NORCO/VICODIN) 5-325 MG tablet Take 1 tablet by mouth every 8 (eight) hours as needed for moderate pain. 90 tablet 0  . OVER THE COUNTER MEDICATION Place 1 drop into both eyes 2 (two) times daily as needed (dry eyes/burning). Over the counter lubricating eye drops    .  pantoprazole (PROTONIX) 40 MG tablet TAKE 1 TABLET BY MOUTH DAILY BEFORE BREAKFAST. 90 tablet 3  . simvastatin (ZOCOR) 20 MG tablet Take 1 tablet by mouth  daily 90 tablet 3  . traMADol (ULTRAM) 50 MG tablet Take 1-2 tablets by mouth every 12 hours as needed for pain 30 tablet 0   No current facility-administered medications for this visit.    Allergies:   Carvedilol; Coreg; Lisinopril; and Sertraline hcl    Social History:  The patient  reports that she has never smoked. She has never used smokeless tobacco. She reports that she does not drink alcohol or use illicit drugs.   Family History:  The patient's family history includes Cancer in her brother and sister; Heart disease in her mother.    ROS:  General:no colds or fevers, + weight loss Skin:no rashes or ulcers HEENT:no blurred vision, no congestion CV:see HPI PUL:see HPI GI:no diarrhea constipation  or melena, no indigestion GU:no hematuria, no dysuria MS:+ joint pain, no claudication Neuro:no syncope, no lightheadedness Endo:no diabetes, no thyroid disease  Wt Readings from Last 3 Encounters:  03/18/15 140 lb (63.504 kg)  02/27/15 144 lb (65.318 kg)  01/03/15 145 lb 9.6 oz (66.044 kg)     PHYSICAL EXAM: VS:  BP 144/72 mmHg  Pulse 69  Ht  (1.575 m)  Wt 140 lb (63.504 kg)  BMI 25.60 kg/m2 , BMI Body mass index is 25.6 kg/(m^2). General:Pleasant affect, NAD Skin:Warm and dry, brisk capillary refill HEENT:normocephalic, sclera clear, mucus membranes moist Neck:supple, no JVD, no bruits  Heart:S1S2 RRR without murmur, gallup, rub or click Lungs:clear without rales, rhonchi, or wheezes ZOX:WRUE, non tender, + BS, do not palpate liver spleen or masses Ext:no lower ext edema, 2+ pedal pulses, 2+ radial pulses, + pain to palpation to Lt ant chest , Lt axilla and Lt scapula--pt with pain raising Lt arm, easier for her to raise it with her rt hand.  Grip in Lt hand < rt due to pain.  Neuro:alert and oriented X 3, MAE, follows commands, + facial symmetry    EKG:  EKG is ordered today. The ekg ordered today demonstrates SR normal EKG   Recent Labs: 04/08/2014: Pro B Natriuretic peptide (BNP) 329.5 10/08/2014: ALT 11 03/07/2015: BUN 17; Creatinine, Ser 1.51*; Hemoglobin 12.7; Platelets 258; Potassium 3.5; Sodium 139    Lipid Panel    Component Value Date/Time   CHOL 149 10/08/2014 0826   TRIG 153.0* 10/08/2014 0826   HDL 44.00 10/08/2014 0826   CHOLHDL 3 10/08/2014 0826   VLDL 30.6 10/08/2014 0826   LDLCALC 74 10/08/2014 0826   LDLDIRECT 85 08/01/2014 1424       Other studies Reviewed: Additional studies/ records that were reviewed today include: previous stress test and echo, previous office notes and ER note. .   ASSESSMENT AND PLAN:  1.  Lt chest and Lt shoulder pain, most likely muscular skeletal.  Possible rotator cuff problem,  Will refer to Dr. Rennis Chris for  eval.  In mean time will give ultram 50 mg 1-2 every 12 hr prn. She has only been taking extra strength tylenol.  We will do lexiscan myoview to rule out ischemia- her EKG is normal.   2. HTN controlled. Continue current meds.   3. hyperlipidemia   Current medicines are reviewed with the patient today.  The patient Has no concerns regarding medicines.  The following changes have been made:  See above Labs/ tests ordered today include:see above  Disposition:   FU:  see above  Nyoka LintSigned, Nikolina Simerson R, NP  03/18/2015 3:35 PM    [facility] [address], [place], [place]  [address]/ [address] [place], [place] Phone: [phone]) [phone]; Fax: [phone]  [phone]

## 2015-03-18 NOTE — Patient Instructions (Addendum)
Medication Instructions:  Your physician has recommended you make the following change in your medication:  1.  START taking the Ultram (Tramodol) 50 mg 1-2 talbets every 12 hours AS NEEDED FOR PAIN   Labwork: None ordered  Testing/Procedures: Your physician has requested that you have a lexiscan myoview. For further information please visit [email or web address] Please follow instruction sheet, as given.    Follow-Up: Your physician recommends that you schedule a follow-up appointment in: 1-2 WEEKS AFTER THE LEXISCAN WITH DR. Elease Hashimoto  Special Instructions:  Pharmacologic Stress Electrocardiogram A pharmacologic stress electrocardiogram is a heart (cardiac) test that uses nuclear imaging to evaluate the blood supply to your heart. This test may also be called a pharmacologic stress electrocardiography. Pharmacologic means that a medicine is used to increase your heart rate and blood pressure.  This stress test is done to find areas of poor blood flow to the heart by determining the extent of coronary artery disease (CAD). Some people exercise on a treadmill, which naturally increases the blood flow to the heart. For those people unable to exercise on a treadmill, a medicine is used. This medicine stimulates your heart and will cause your heart to beat harder and more quickly, as if you were exercising.  Pharmacologic stress tests can help determine:  The adequacy of blood flow to your heart during increased levels of activity in order to clear you for discharge home.  The extent of coronary artery blockage caused by CAD.  Your prognosis if you have suffered a heart attack.  The effectiveness of cardiac procedures done, such as an angioplasty, which can increase the circulation in your coronary arteries.  Causes of chest pain or pressure. LET [facility] CARE PROVIDER KNOW ABOUT:  Any allergies you have.  All medicines you are taking, including vitamins, herbs, eye drops, creams, and  over-the-counter medicines.  Previous problems you or members of your family have had with the use of anesthetics.  Any blood disorders you have.  Previous surgeries you have had.  Medical conditions you have.  Possibility of pregnancy, if this applies.  If you are currently breastfeeding. RISKS AND COMPLICATIONS Generally, this is a safe procedure. However, as with any procedure, complications can occur. Possible complications include:  You develop pain or pressure in the following areas:  Chest.  Jaw or neck.  Between your shoulder blades.  Radiating down your left arm.  Headache.  Dizziness or light-headedness.  Shortness of breath.  Increased or irregular heartbeat.  Low blood pressure.  Nausea or vomiting.  Flushing.  Redness going up the arm and slight pain during injection of medicine.  Heart attack (rare). BEFORE THE PROCEDURE   Avoid all forms of caffeine for 24 hours before your test or as directed by your health care provider. This includes coffee, tea (even decaffeinated tea), caffeinated sodas, chocolate, cocoa, and certain pain medicines.  Follow your health care provider's instructions regarding eating and drinking before the test.  Take your medicines as directed at regular times with water unless instructed otherwise. Exceptions may include:  If you have diabetes, ask how you are to take your insulin or pills. It is common to adjust insulin dosing the morning of the test.  If you are taking beta-blocker medicines, it is important to talk to your health care provider about these medicines well before the date of your test. Taking beta-blocker medicines may interfere with the test. In some cases, these medicines need to be changed or stopped 24 hours or more before the  test.  If you wear a nitroglycerin patch, it may need to be removed prior to the test. Ask your health care provider if the patch should be removed before the test.  If you use an  inhaler for any breathing condition, bring it with you to the test.  If you are an outpatient, bring a snack so you can eat right after the stress phase of the test.  Do not smoke for 4 hours prior to the test or as directed by your health care provider.  Do not apply lotions, powders, creams, or oils on your chest prior to the test.  Wear comfortable shoes and clothing. Let your health care provider know if you were unable to complete or follow the preparations for your test. PROCEDURE   Multiple patches (electrodes) will be put on your chest. If needed, small areas of your chest may be shaved to get better contact with the electrodes. Once the electrodes are attached to your body, multiple wires will be attached to the electrodes, and your heart rate will be monitored.  An IV access will be started. A nuclear trace (isotope) is given. The isotope may be given intravenously, or it may be swallowed. Nuclear refers to several types of radioactive isotopes, and the nuclear isotope lights up the arteries so that the nuclear images are clear. The isotope is absorbed by your body. This results in low radiation exposure.  A resting nuclear image is taken to show how your heart functions at rest.  A medicine is given through the IV access.  A second scan is done about 1 hour after the medicine injection and determines how your heart functions under stress.  During this stress phase, you will be connected to an electrocardiogram machine. Your blood pressure and oxygen levels will be monitored. AFTER THE PROCEDURE   Your heart rate and blood pressure will be monitored after the test.  You may return to your normal schedule, including diet,activities, and medicines, unless your health care provider tells you otherwise.   This information is not intended to replace advice given to you by your health care provider. Make sure you discuss any questions you have with your health care provider.    Document Released: 09/04/2008 Document Revised: 04/23/2013 Document Reviewed: 12/24/2012 Elsevier Interactive Patient Education [employer]2016 [employer]

## 2015-03-18 NOTE — Telephone Encounter (Signed)
New Message  Pt c/o of Chest Pain: 1. Are you having CP right now? Yes  2. Are you experiencing any other symptoms (ex. SOB, nausea, vomiting, sweating)? No  ( Some times she has SOB. Her words are slurred)  3. How long have you been experiencing CP? For a couple of weeks (2 weeks) not getting any better 4. Is your CP continuous or coming and going? Continuous.  5. Have you taken Nitroglycerin? No per pt she doesn't have any.    Per husbad took her to urgent medical. She was sent to the ER, came from the ER hurting more that she ent in. The prescribed her 500mg  of tylenol. The patient is in pain more than ever. She is not sure that it is heart related. req a call back from the nurse

## 2015-03-19 ENCOUNTER — Ambulatory Visit: Payer: Medicare Other

## 2015-03-19 ENCOUNTER — Telehealth (HOSPITAL_COMMUNITY): Payer: Self-pay | Admitting: *Deleted

## 2015-03-19 NOTE — Telephone Encounter (Signed)
Attempted to call regarding upcoming appointment- no answer. Westen Dinino J Wenceslaus Gist, RN 

## 2015-03-20 ENCOUNTER — Ambulatory Visit (HOSPITAL_COMMUNITY): Payer: Medicare Other | Attending: Cardiology

## 2015-03-20 DIAGNOSIS — R079 Chest pain, unspecified: Secondary | ICD-10-CM

## 2015-03-20 DIAGNOSIS — I10 Essential (primary) hypertension: Secondary | ICD-10-CM

## 2015-03-20 LAB — MYOCARDIAL PERFUSION IMAGING
LV dias vol: 59 mL
LV sys vol: 12 mL
Peak HR: 101 {beats}/min
RATE: 0.29
Rest HR: 66 {beats}/min
SDS: 4
SRS: 3
SSS: 7
TID: 0.93

## 2015-03-20 MED ORDER — REGADENOSON 0.4 MG/5ML IV SOLN
0.4000 mg | Freq: Once | INTRAVENOUS | Status: AC
  Administered 2015-03-20: 0.4 mg via INTRAVENOUS

## 2015-03-20 MED ORDER — TECHNETIUM TC 99M SESTAMIBI GENERIC - CARDIOLITE
9.6000 | Freq: Once | INTRAVENOUS | Status: AC | PRN
  Administered 2015-03-20: 10 via INTRAVENOUS

## 2015-03-20 MED ORDER — TECHNETIUM TC 99M SESTAMIBI GENERIC - CARDIOLITE
32.1000 | Freq: Once | INTRAVENOUS | Status: AC | PRN
  Administered 2015-03-20: 32.1 via INTRAVENOUS

## 2015-03-23 ENCOUNTER — Telehealth: Payer: Self-pay | Admitting: *Deleted

## 2015-03-23 NOTE — Telephone Encounter (Signed)
-----   Message from Laura R Ingold, NP sent at 03/20/2015  6:06 PM EST ----- Please reassure pt that her heart looked good on stress test.  No lack of blood supply, see orthopedic doctor for treatment of her shoulder pain. 

## 2015-03-31 ENCOUNTER — Encounter: Payer: Self-pay | Admitting: Cardiovascular Disease

## 2015-03-31 ENCOUNTER — Ambulatory Visit (INDEPENDENT_AMBULATORY_CARE_PROVIDER_SITE_OTHER): Payer: Medicare Other | Admitting: Cardiovascular Disease

## 2015-03-31 VITALS — BP 124/74 | HR 80 | Ht 62.0 in | Wt 141.0 lb

## 2015-03-31 DIAGNOSIS — I10 Essential (primary) hypertension: Secondary | ICD-10-CM

## 2015-03-31 NOTE — Patient Instructions (Addendum)
See [facility] for your shoulder issues.  [phone]  Medication Instructions:  Your physician recommends that you continue on your current medications as directed. Please refer to the Current Medication list given to you today.   Labwork: None Ordered   Testing/Procedures: None Ordered   Follow-Up: Your physician recommends that you schedule a follow-up appointment in: as needed with Dr. Elease HashimotoNahser   If you need a refill on your cardiac medications before your next appointment, please call your pharmacy.   Thank you for choosing [facility]! Eligha BridegroomMichelle Swinyer, RN [phone]

## 2015-03-31 NOTE — Progress Notes (Signed)
Destiny Moore Date of Birth  14-Mar-1933       [facility]    [place] [address]  [address] [place], [place]  [address]   [place], [place]  [address] [phone]     [phone]   Fax  [phone]    Fax [phone]  Problem List: 1. Hypertension 2. hyperlipidemia 3. Mild PVD - mild right carotid stenosis 4. Chest pain - normal stress myoview in 2001  October 08, 2014:  Destiny Moore is a 81 yo with hx of CP in the past.  She was last seen in 2014. BP has been well controlled No CP or dyspnea.    03/30/2005 andRenea Moore recently presented to our office with some chest pain. She was seen by Nada Boozer, NP.   She was thought to have a rotator cuff issue. A stress Myoview study was ordered and was   Normal. She had no evidence of ischemia and her left ventricle systolic and was normal with an EF of 79%. Has been advised to see  ortho for her left shoulder pain .    Current Outpatient Prescriptions on File Prior to Visit  Medication Sig Dispense Refill  . acetaminophen (TYLENOL) 500 MG tablet Take 1 tablet (500 mg total) by mouth every 6 (six) hours as needed. 30 tablet 0  . alendronate (FOSAMAX) 70 MG tablet Take 70 mg by mouth every Monday. Take with a full glass of water on an empty stomach.    Marland Kitchen amLODipine (NORVASC) 10 MG tablet Take 10 mg by mouth daily.    . Ascorbic Acid (VITAMIN C PO) Take 1 tablet by mouth daily.    Marland Kitchen aspirin EC 81 MG tablet Take 81 mg by mouth daily.    . cholecalciferol (VITAMIN D) 1000 UNITS tablet Take 1,000 Units by mouth daily.    Marland Kitchen CRANBERRY PO Take 1 tablet by mouth daily.    . Cyanocobalamin (VITAMIN B-12 PO) Take 1 tablet by mouth daily.    Marland Kitchen EPINEPHrine (EPIPEN) 0.3 mg/0.3 mL DEVI Inject 0.3 mg into the muscle as needed (for allergic reaction).     . gabapentin (NEURONTIN) 100 MG capsule Take 300 mg by mouth at bedtime.    . hydrochlorothiazide (MICROZIDE) 12.5 MG capsule Take 12.5 mg by mouth  daily.    Marland Kitchen HYDROcodone-acetaminophen (NORCO/VICODIN) 5-325 MG tablet Take 1 tablet by mouth every 8 (eight) hours as needed for moderate pain. 90 tablet 0  . OVER THE COUNTER MEDICATION Place 1 drop into both eyes 2 (two) times daily as needed (dry eyes/burning). Over the counter lubricating eye drops    . pantoprazole (PROTONIX) 40 MG tablet TAKE 1 TABLET BY MOUTH DAILY BEFORE BREAKFAST. 90 tablet 3  . simvastatin (ZOCOR) 20 MG tablet Take 1 tablet by mouth  daily 90 tablet 3  . traMADol (ULTRAM) 50 MG tablet Take 1-2 tablets by mouth every 12 hours as needed for pain 30 tablet 0   No current facility-administered medications on file prior to visit.    Allergies  Allergen Reactions  . Carvedilol Anaphylaxis  . Coreg Anaphylaxis  . Lisinopril Anaphylaxis  . Sertraline Hcl Other (See Comments)    Could not feed herself (no hand to mouth co-ordination)    Past Medical History  Diagnosis Date  . Hypertension   . Hyperlipidemia   . Anxiety   . UTI (urinary tract infection)   . Depression   . Osteoporosis   . Allergy   .  CKD (chronic kidney disease)     stage 3 as of 04/2014.     Past Surgical History  Procedure Laterality Date  . Hystrectomy    . Bladder tact    . Abdominal hysterectomy    . Incontinence surgery    . Cholecystectomy N/A 05/08/2014    Procedure: LAPAROSCOPIC CHOLECYSTECTOMY WITH INTRAOPERATIVE CHOLANGIOGRAM;  Surgeon: Manus RuddMatthew Tsuei, MD;  Location: [facility] OR;  Service: General;  Laterality: N/A;    History  Smoking status  . Never Smoker   Smokeless tobacco  . Never Used    History  Alcohol Use No    Family History  Problem Relation Age of Onset  . Heart disease Mother   . Cancer Sister     breast  . Cancer Brother     skin, kidney, lung    Reviw of Systems:  Reviewed in the HPI.  All other systems are negative.  Physical Exam: Blood pressure 124/74, pulse 80, height 5\' 2"  (1.575 m), weight 141 lb (63.957 kg). General: Well developed, well  nourished, in no acute distress.  Head: Normocephalic, atraumatic, sclera non-icteric, mucus membranes are moist,   Neck: Supple. Carotids are 2 + without bruits. No JVD   Lungs: Clear   Heart: RR, normal S1, S2  Abdomen: Soft, non-tender, non-distended with normal bowel sounds.  Msk:  Strength and tone are normal  Extremities: No clubbing or cyanosis. No edema.  Distal pedal pulses are 2+ and equal    Neuro: CN II - XII intact.  Alert and oriented X 3.   Psych:  Normal   ECG:  Assessment / Plan:   1. Noncardiac chest pain : Patient has had noncardiac chest pain for years. She had an episode of chest pain last month and was evaluated in the emergency room. Her workup was unremarkable. She's not had any further episodes of pain. Has had several negative myoviews over the years.   So far, she has not been diagnosed with any specific cardiac issues.   2. Essential hypertension: Continue current medications. Her blood pressure is well-controlled. Will have her medical doctor continue her BP meds.    Nahser, Deloris PingPhilip J, MD  03/31/2015 2:15 PM    [facility] [address]  [address] [place], [place]  [address] Pager [phone] Phone: [phone]; Fax: [phone]   [facility]  [address] [place], [place]  [address] [phone]    Fax [phone]

## 2015-04-05 DIAGNOSIS — F419 Anxiety disorder, unspecified: Secondary | ICD-10-CM | POA: Insufficient documentation

## 2015-04-05 DIAGNOSIS — F329 Major depressive disorder, single episode, unspecified: Secondary | ICD-10-CM | POA: Insufficient documentation

## 2015-04-05 DIAGNOSIS — E785 Hyperlipidemia, unspecified: Secondary | ICD-10-CM | POA: Insufficient documentation

## 2015-04-05 DIAGNOSIS — R079 Chest pain, unspecified: Secondary | ICD-10-CM | POA: Diagnosis present

## 2015-04-05 DIAGNOSIS — R0789 Other chest pain: Secondary | ICD-10-CM | POA: Insufficient documentation

## 2015-04-05 DIAGNOSIS — I129 Hypertensive chronic kidney disease with stage 1 through stage 4 chronic kidney disease, or unspecified chronic kidney disease: Secondary | ICD-10-CM | POA: Insufficient documentation

## 2015-04-05 DIAGNOSIS — M1990 Unspecified osteoarthritis, unspecified site: Secondary | ICD-10-CM | POA: Diagnosis not present

## 2015-04-05 DIAGNOSIS — M19012 Primary osteoarthritis, left shoulder: Secondary | ICD-10-CM | POA: Insufficient documentation

## 2015-04-05 DIAGNOSIS — Z87440 Personal history of urinary (tract) infections: Secondary | ICD-10-CM | POA: Insufficient documentation

## 2015-04-05 DIAGNOSIS — Z7982 Long term (current) use of aspirin: Secondary | ICD-10-CM | POA: Insufficient documentation

## 2015-04-05 DIAGNOSIS — Z79899 Other long term (current) drug therapy: Secondary | ICD-10-CM | POA: Insufficient documentation

## 2015-04-05 DIAGNOSIS — N183 Chronic kidney disease, stage 3 (moderate): Secondary | ICD-10-CM | POA: Diagnosis not present

## 2015-04-06 ENCOUNTER — Emergency Department (HOSPITAL_COMMUNITY): Payer: Medicare Other

## 2015-04-06 ENCOUNTER — Emergency Department (HOSPITAL_COMMUNITY)
Admission: EM | Admit: 2015-04-06 | Discharge: 2015-04-06 | Disposition: A | Discharge status: Home / Self Care | Payer: Medicare Other | Attending: Emergency Medicine | Admitting: Emergency Medicine

## 2015-04-06 ENCOUNTER — Encounter (HOSPITAL_COMMUNITY): Payer: Self-pay | Admitting: *Deleted

## 2015-04-06 DIAGNOSIS — M2550 Pain in unspecified joint: Secondary | ICD-10-CM

## 2015-04-06 HISTORY — DX: Unspecified osteoarthritis, unspecified site: M19.90

## 2015-04-06 LAB — CBC
HCT: 37.8 % (ref 36.0–46.0)
Hemoglobin: 12.7 g/dL (ref 12.0–15.0)
MCH: 32.2 pg (ref 26.0–34.0)
MCHC: 33.6 g/dL (ref 30.0–36.0)
MCV: 95.7 fL (ref 78.0–100.0)
PLATELETS: 257 10*3/uL (ref 150–400)
RBC: 3.95 MIL/uL (ref 3.87–5.11)
RDW: 14.3 % (ref 11.5–15.5)
WBC: 6.9 10*3/uL (ref 4.0–10.5)

## 2015-04-06 LAB — TROPONIN I: Troponin I: <0.03 ng/mL (ref <0.031)

## 2015-04-06 LAB — BASIC METABOLIC PANEL
Anion gap: 10 (ref 5–15)
BUN: 17 mg/dL (ref 6–20)
CALCIUM: 10.3 mg/dL (ref 8.9–10.3)
CHLORIDE: 108 mmol/L (ref 101–111)
CO2: 24 mmol/L (ref 22–32)
CREATININE: 1.42 mg/dL — AB (ref 0.44–1.00)
GFR calc Af Amer: 39 mL/min — ABNORMAL LOW (ref >60)
GFR calc non Af Amer: 34 mL/min — ABNORMAL LOW (ref >60)
Glucose, Bld: 115 mg/dL — ABNORMAL HIGH (ref 65–99)
Potassium: 3.7 mmol/L (ref 3.5–5.1)
Sodium: 142 mmol/L (ref 135–145)

## 2015-04-06 LAB — I-STAT TROPONIN, ED: Troponin i, poc: 0.01 ng/mL (ref 0.00–0.08)

## 2015-04-06 MED ORDER — METHYLPREDNISOLONE ACETATE 40 MG/ML IJ SUSP
20.0000 mg | Freq: Once | INTRAMUSCULAR | Status: AC
  Administered 2015-04-06: 20 mg via INTRA_ARTICULAR
  Filled 2015-04-06: qty 1

## 2015-04-06 MED ORDER — BUPIVACAINE HCL (PF) 0.5 % IJ SOLN
5.0000 mL | Freq: Once | INTRAMUSCULAR | Status: AC
  Administered 2015-04-06: 5 mL
  Filled 2015-04-06: qty 10

## 2015-04-06 MED ORDER — MORPHINE SULFATE (PF) 2 MG/ML IV SOLN
2.0000 mg | Freq: Once | INTRAVENOUS | Status: AC
  Administered 2015-04-06: 2 mg via INTRAVENOUS
  Filled 2015-04-06: qty 1

## 2015-04-06 MED ORDER — ONDANSETRON HCL 4 MG/2ML IJ SOLN
4.0000 mg | Freq: Once | INTRAMUSCULAR | Status: AC
  Administered 2015-04-06: 4 mg via INTRAVENOUS
  Filled 2015-04-06: qty 2

## 2015-04-06 NOTE — Discharge Instructions (Signed)

## 2015-04-06 NOTE — ED Notes (Signed)
Patient left at this time with all belongings. 

## 2015-04-06 NOTE — ED Notes (Signed)
The pts husband  Reports that the pts heart has been checked out numerus times and it has checked out ok.    Diagnosed with arthritis

## 2015-04-06 NOTE — ED Provider Notes (Signed)
CSN: [id number]     Arrival date & time 04/05/15  2356 History   By signing my name below, I, [name], attest that this documentation has been prepared under the direction and in the presence of Destiny Crease, MD.  Electronically Signed: [name], ED Scribe. 04/06/2015. 12:26 AM.   Chief Complaint  Patient presents with  . Chest Pain   HPI  HPI Comments: Destiny Moore is a 81 y.o. female with a PMHx of HTN, hyperlipidemia, and CKD who presents to the Emergency Department complaining of intermittent, ongoing chest pain x several weeks; worsened this evening. Ongoing pain to the L shoulder, shortness of breath and nausea also reported. Pt states shoulder pain is made worse with movement and deep palpation. No alleviating factors at this time. No OTC medications or home remedies attempted prior to arrival. No recent fever, chills, abdominal pain, or vomiting. Pt was evaluated on 11/5 for same and was safely discharged home. Destiny Moore has also been evaluated previously multiple times by cardiology for same.  PCP: Destiny Pilar, DO    Past Medical History  Diagnosis Date  . Hypertension   . Hyperlipidemia   . Anxiety   . UTI (urinary tract infection)   . Depression   . Osteoporosis   . Allergy   . CKD (chronic kidney disease)     stage 3 as of 04/2014.   Marland Kitchen Arthritis    Past Surgical History  Procedure Laterality Date  . Hystrectomy    . Bladder tact    . Abdominal hysterectomy    . Incontinence surgery    . Cholecystectomy N/A 05/08/2014    Procedure: LAPAROSCOPIC CHOLECYSTECTOMY WITH INTRAOPERATIVE CHOLANGIOGRAM;  Surgeon: Manus Rudd, MD;  Location: [facility] OR;  Service: General;  Laterality: N/A;   Family History  Problem Relation Age of Onset  . Heart disease Mother   . Cancer Sister     breast  . Cancer Brother     skin, kidney, lung   Social History  Substance Use Topics  . Smoking status: Never Smoker   . Smokeless tobacco: Never Used  .  Alcohol Use: No   OB History    No data available     Review of Systems  A complete 10 system review of systems was obtained and all systems are negative except as noted in the HPI and PMH.    Allergies  Carvedilol; Coreg; Lisinopril; and Sertraline hcl  Home Medications   Prior to Admission medications   Medication Sig Start Date End Date Taking? Authorizing Provider  acetaminophen (TYLENOL) 500 MG tablet Take 1 tablet (500 mg total) by mouth every 6 (six) hours as needed. 03/07/15  Yes Destiny Fowler, PA-C  alendronate (FOSAMAX) 70 MG tablet Take 70 mg by mouth every Monday. Take with a full glass of water on an empty stomach.   Yes Historical Provider, MD  Destiny Moore (NORVASC) 10 MG tablet Take 10 mg by mouth daily. 01/07/15  Yes Historical Provider, MD  Ascorbic Acid (VITAMIN C PO) Take 1 tablet by mouth daily.   Yes Historical Provider, MD  aspirin EC 81 MG tablet Take 81 mg by mouth daily.   Yes Historical Provider, MD  cholecalciferol (VITAMIN D) 1000 UNITS tablet Take 1,000 Units by mouth daily.   Yes Historical Provider, MD  CRANBERRY PO Take 1 tablet by mouth daily.   Yes Historical Provider, MD  Cyanocobalamin (VITAMIN B-12 PO) Take 1 tablet by mouth daily.   Yes Historical Provider, MD  gabapentin (NEURONTIN) 100 MG capsule Take 300 mg by mouth at bedtime.   Yes Historical Provider, MD  hydrochlorothiazide (MICROZIDE) 12.5 MG capsule Take 12.5 mg by mouth daily. 01/07/15  Yes Historical Provider, MD  HYDROcodone-acetaminophen (NORCO/VICODIN) 5-325 MG tablet Take 1 tablet by mouth every 8 (eight) hours as needed for moderate pain. 02/27/15  Yes Alexander J Karamalegos, DO  OVER THE COUNTER MEDICATION Place 1 drop into both eyes 2 (two) times daily as needed (dry eyes/burning). Over the counter lubricating eye drops   Yes Historical Provider, MD  pantoprazole (PROTONIX) 40 MG tablet TAKE 1 TABLET BY MOUTH DAILY BEFORE BREAKFAST. 11/17/14  Yes Destiny Cords, DO  simvastatin  (ZOCOR) 20 MG tablet Take 1 tablet by mouth  daily 03/13/15  Yes Alexander J Karamalegos, DO  EPINEPHrine (EPIPEN) 0.3 mg/0.3 mL DEVI Inject 0.3 mg into the muscle as needed (for allergic reaction).     Historical Provider, MD  traMADol (ULTRAM) 50 MG tablet Take 1-2 tablets by mouth every 12 hours as needed for pain 03/18/15   Destiny Brand, NP   Triage Vitals: BP 203/94 mmHg  Pulse 72  Temp(Src) 98.7 F (37.1 C) (Oral)  Resp 12  SpO2 99%   Physical Exam  Constitutional: She is oriented to person, place, and time. She appears well-developed and well-nourished. No distress.  HENT:  Head: Normocephalic and atraumatic.  Right Ear: Hearing normal.  Left Ear: Hearing normal.  Nose: Nose normal.  Mouth/Throat: Oropharynx is clear and moist and mucous membranes are normal.  Eyes: Conjunctivae and EOM are normal. Pupils are equal, round, and reactive to light.  Neck: Normal range of motion. Neck supple.  Cardiovascular: Regular rhythm, S1 normal and S2 normal.  Exam reveals no gallop and no friction rub.   No murmur heard. Pulmonary/Chest: Effort normal and breath sounds normal. No respiratory distress. She exhibits tenderness.  L chest wall tenderness noted  Abdominal: Soft. Normal appearance and bowel sounds are normal. There is no hepatosplenomegaly. There is no tenderness. There is no rebound, no guarding, no tenderness at McBurney's point and negative Murphy's sign. No hernia.  Musculoskeletal: Normal range of motion. She exhibits tenderness.  L shoulder tenderness noted Decreased ROM of L shoulder secondary to pain   Neurological: She is alert and oriented to person, place, and time. She has normal strength. No cranial nerve deficit or sensory deficit. Coordination normal. GCS eye subscore is 4. GCS verbal subscore is 5. GCS motor subscore is 6.  Skin: Skin is warm, dry and intact. No rash noted. No cyanosis.  Psychiatric: She has a normal mood and affect. Her speech is normal and  behavior is normal. Thought content normal.  Nursing note and vitals reviewed.   ED Course  Procedures (including critical care time)  DIAGNOSTIC STUDIES: Oxygen Saturation is 99% on RA, Normal by my interpretation.    COORDINATION OF CARE: 12:09 AM- Will order CXR, BMP, CMP, Troponin I, i-stat troponin, and EKG. Discussed treatment plan with pt at bedside and pt agreed to plan.     Labs Review Labs Reviewed  BASIC METABOLIC PANEL - Abnormal; Notable for the following:    Glucose, Bld 115 (*)    Creatinine, Ser 1.42 (*)    GFR calc non Af Amer 34 (*)    GFR calc Af Amer 39 (*)    All other components within normal limits  CBC  TROPONIN I  Rosezena Sensor, ED    Imaging Review Dg Chest 2 View  04/06/2015  CLINICAL DATA:  Intermittent chest pain for several weeks, worse this evening. Shortness of breath and nausea, worse with shoulder movement. History of hypertension, hyperlipidemia. EXAM: CHEST  2 VIEW COMPARISON:  Chest radiograph March 07, 2015 FINDINGS: Cardiomediastinal silhouette is normal. Mildly calcified aortic knob. The lungs are clear without pleural effusions or focal consolidations. Trachea projects midline and there is no pneumothorax. Soft tissue planes and included osseous structures are non-suspicious. Patient is osteopenic. Surgical clips in the included right abdomen compatible with cholecystectomy. IMPRESSION: No active cardiopulmonary disease. Electronically Signed   By: Awilda Metroourtnay  Bloomer M.D.   On: 04/06/2015 00:33   I have personally reviewed and evaluated these images and lab results as part of my medical decision-making.   EKG Interpretation   Date/Time:  Sunday April 05 2015 23:58:26 EST Ventricular Rate:  79 PR Interval:  164 QRS Duration: 70 QT Interval:  370 QTC Calculation: 424 R Axis:   12 Text Interpretation:  Normal sinus rhythm Normal ECG Confirmed by Laiah Pouncey   MD, Guy Toney ([id number]) on 04/06/2015 12:16:26 AM      MDM   Final  diagnoses:  None  shoulder arthritis  History emergency for evaluation of chest pain. Patient has been experiencing this for months. She has been worked up multiple times in the hospital and by primary doctor. This included cardiac evaluation with stress test. Ultimately it was determined that she has arthritis in her left shoulder. Examination revealed exquisite tenderness around the area of the left shoulder and inability to move the shoulder because of severe pain. Cardiac evaluation was negative once again.  Patient's left glenohumeral joint was injected with 20 mg of Depo-Medrol and 3 mL's of bupivacaine. Patient had nearly complete resolution of the pain in the shoulder, nor has normal range of motion now after injection. She does have follow-up scheduled with orthopedics.  I personally performed the services described in this documentation, which was scribed in my presence. The recorded information has been reviewed and is accurate.   Destiny Creasehristopher J Charla Criscione, MD 04/12/15 [phone]

## 2015-04-06 NOTE — ED Notes (Signed)
The pt is c/o chest pain that started tonight  With sob and nausea.  She is hyperventilating

## 2015-04-08 ENCOUNTER — Telehealth: Payer: Self-pay | Admitting: *Deleted

## 2015-04-08 NOTE — Telephone Encounter (Signed)
-----   Message from Leone BrandLaura R Ingold, NP sent at 03/20/2015  6:06 PM EST ----- Please reassure pt that her heart looked good on stress test.  No lack of blood supply, see orthopedic doctor for treatment of her shoulder pain.

## 2015-05-27 ENCOUNTER — Encounter: Payer: Self-pay | Admitting: Family Medicine

## 2015-05-27 ENCOUNTER — Ambulatory Visit (INDEPENDENT_AMBULATORY_CARE_PROVIDER_SITE_OTHER): Payer: Medicare Other | Admitting: Family Medicine

## 2015-05-27 VITALS — BP 135/66 | HR 69 | Temp 97.9°F | Ht 62.0 in | Wt 137.0 lb

## 2015-05-27 DIAGNOSIS — R413 Other amnesia: Secondary | ICD-10-CM

## 2015-05-27 DIAGNOSIS — M150 Primary generalized (osteo)arthritis: Secondary | ICD-10-CM

## 2015-05-27 DIAGNOSIS — M159 Polyosteoarthritis, unspecified: Secondary | ICD-10-CM

## 2015-05-27 NOTE — Progress Notes (Signed)
Subjective:    Patient ID: Destiny Moore, female    DOB: 1932/06/06, 82 y.o.   MRN: [id number]  Destiny Moore is a 82 y.o. female presenting on 05/27/2015 for Memory Loss; Medication Problem; Arm Pain; and Neck Pain  Daughter Steward Drone, "Luster Landsberg") present today with most of history. She lives in [place] and will be available on occasion to stay with her mother for time periods. Also husband present.   HPI   MEMORY LOSS, CHRONIC PROGRESSIVE: - Chronic known problem with gradual worsening memory loss, has been seen for this complaint at [facility] multiple times over 2016. History by patient as well as family members with husband and daughter all describing a gradual decline relating to poor memory, forgetfulness, confusion, and speech. Last visit I had advised her to follow-up with Geriatic Clinic at [facility] to see Dr McDiarmid for a full geri assessment, however she no showed for this apt. - Today most of history by daughter and husband, they describe difficulties recently over holidays and christmas with difficulty remembering family members, sometimes reduced speech or not finishing sentences gradually. Confusion with location and getting lost but not wandering. Difficulty with giving herself own medications, has a pill box now organized by daughter and husband. - Admits to feeling depressed at times, but today PHQ-9: score 1 - Additionally admits to feeling tired at time during day worsening memory, takes naps frequently. - Denies any abnormal behavior / mood swings / agitation or violent behavior  DJD with SHOULDER PAIN, LEFT (s/p fall 10/2014, chronic) and RIGHT: - S/p known injury with fall 10/2014 had about 3-5 months of chronic shoulder and arm pain, prior x-rays negative for fracture but with mod DJD, also interval history has also had Shingles on RIGHT side of upper to mid back, not on left. Recently completed HH physical therapy with notable improvement per their discharge report, finished on  02/17/15. - Today she reports mostly resolved pain in Left shoulder, she has been to Orthopedics after last referral, and they plan to start PT for neck and shoulders on 06/05/15. Now complains of some Right sided shoulder pain, however she is s/p shingles in this area, taking Gabapentin with some relief. Also takes chronic hydrocodone 10/325 occasionally. No longer taking Tramadol. - Denies any recent fall, new injury or trauma, edema, bruising, fevers/chills   Social History   Social History  . Marital Status: Married    Spouse Name: SHAGUN WORDELL  . Number of Children: 2  . Years of Education: 10   Occupational History  . Retired- Emergency planning/management officer    Social History Main Topics  . Smoking status: Never Smoker   . Smokeless tobacco: Never Used  . Alcohol Use: No  . Drug Use: No  . Sexual Activity: Not on file   Other Topics Concern  . Not on file   Social History Narrative   Health Care POA:    Emergency Contact: Garnette Gunner [phone] (h)    End of Life Plan:    Who lives with you: Lives with husband,  step son Nomi Rudnicki   Any pets: 4 cats   Diet: Patient has a varied diet of protein, starch, and vegetables.    Exercise: Patient does not have any regular exercise plan.  Does not like to exercise by her self.   Seatbelts: Patient reports wearing seat belt when in vehicle.   Sun Exposure/Protection: Patient does not wear sun protection.   Hobbies: cooking, traveling, visiting family in  [place]                   Review of Systems   Per HPI unless specifically indicated above     Objective:    BP 135/66 mmHg  Pulse 69  Temp(Src) 97.9 F (36.6 C) (Oral)  Ht  (1.575 m)  Wt 137 lb (62.143 kg)  BMI 25.05 kg/m2  Wt Readings from Last 3 Encounters:  05/27/15 137 lb (62.143 kg)  03/31/15 141 lb (63.957 kg)  03/18/15 140 lb (63.504 kg)    Physical Exam  Constitutional: She is oriented to person, place, and time. She appears well-developed and  well-nourished. No distress.  HENT:  Head: Normocephalic and atraumatic.  Neck: Normal range of motion. Neck supple.  Cardiovascular: Intact distal pulses.   Musculoskeletal: She exhibits no edema.  Negative rotator cuff testing with supraspinatus full and empty can testing, negative impingement with Hawkins test  Neurological: She is alert and oriented to person, place, and time.  Skin: Skin is warm and dry. She is not diaphoretic.  Psychiatric: She has a normal mood and affect. Her behavior is normal.  Good eye contact, engages in conversation, sometimes difficulty finishing thoughts  Nursing note and vitals reviewed.      Assessment & Plan:   Problem List Items Addressed This Visit    Memory loss - Primary    Suspected Mild Cognitive Impairment with gradual worsening, chronic memory loss in elderly patient, with some reported depression and increased fatigue. No clinical behavioral symptoms of dementia. No loss of function. Performs most ADLs. - Last Mini-Cog 11/12/2014 with score 5/5, supporting less likely dementia  Plan: 1. Advised to re-schedule Geriatic Clinic assessment by Dr McDiarmid to discuss further details on memory loss, may benefit from full MOCA vs MMSE, had additional geri issues including recurrent falls and some intermittent urinary incontinence 2. Placed referral for [facility] RN for medication assistance at home, given difficulties with administering own meds, maybe able to teach family to assist. Previously discharged from [facility] for PT and RN over past several months      Relevant Orders   Ambulatory referral to Home Health   Osteoarthritis of multiple joints    Subacute worsening Right shoulder pains intermittently with likely OA, in setting of now resolving Left shoulder pains from OA and prior fall injury since 10/2014. Also Right sided shoulder/back pain may be postherpetic neuralgia from recent shingles 01/2015. - Now established with Ortho and proceeding to PT  on 06/05/15  Plan: 1. Advised to reduce Norco 10/325 to cut in half and take infrequently, advised oversedation will worsen memory, if pain is improving with PT she should use less pain medication 2. Advised on safe dose Tylenol 3. Continue Gabapentin for chronic pain including prior s/p R-shoulder back shingles in 01/2015 4. Follow-up Ortho and PT         No orders of the defined types were placed in this encounter.      Follow up plan: Return in about 4 weeks (around 06/24/2015) for geriatrics clinic for memory loss.  Saralyn Pilar, DO [facility], PGY-3

## 2015-05-27 NOTE — Patient Instructions (Signed)
Thank you for coming in to clinic today  Reviewed med list. Cut Hydrocodone-acetaminophen pills in half take only as needed, this can cause some confusion Do NOT take any benadryl, will cause confusion  No other changes today  Will order a Home Health Nurse for reviewing pillbox at home, this may take a week or so until they contact you. You can inquire about other home health services available.  Schedule "Geriatrics Clinic Appointment - Dr. McDiarmid" for Memory loss and urinary incontinence - soonest available.  If you have any other questions or concerns, please feel free to call the clinic to contact me. You may also schedule an earlier appointment if necessary.  However, if your symptoms get significantly worse, please go to the Emergency Department to seek immediate medical attention.  Saralyn Pilar, DO [facility]

## 2015-05-28 NOTE — Assessment & Plan Note (Signed)
Subacute worsening Right shoulder pains intermittently with likely OA, in setting of now resolving Left shoulder pains from OA and prior fall injury since 10/2014. Also Right sided shoulder/back pain may be postherpetic neuralgia from recent shingles 01/2015. - Now established with Ortho and proceeding to PT on 06/05/15  Plan: 1. Advised to reduce Norco 10/325 to cut in half and take infrequently, advised oversedation will worsen memory, if pain is improving with PT she should use less pain medication 2. Advised on safe dose Tylenol 3. Continue Gabapentin for chronic pain including prior s/p R-shoulder back shingles in 01/2015 4. Follow-up Ortho and PT

## 2015-05-28 NOTE — Assessment & Plan Note (Signed)
Suspected Mild Cognitive Impairment with gradual worsening, chronic memory loss in elderly patient, with some reported depression and increased fatigue. No clinical behavioral symptoms of dementia. No loss of function. Performs most ADLs. - Last Mini-Cog 11/12/2014 with score 5/5, supporting less likely dementia  Plan: 1. Advised to re-schedule Geriatic Clinic assessment by Dr McDiarmid to discuss further details on memory loss, may benefit from full MOCA vs MMSE, had additional geri issues including recurrent falls and some intermittent urinary incontinence 2. Placed referral for [facility] RN for medication assistance at home, given difficulties with administering own meds, maybe able to teach family to assist. Previously discharged from [facility] for PT and RN over past several months

## 2015-06-01 ENCOUNTER — Telehealth: Payer: Self-pay | Admitting: *Deleted

## 2015-06-01 DIAGNOSIS — Z87892 Personal history of anaphylaxis: Secondary | ICD-10-CM

## 2015-06-01 DIAGNOSIS — R197 Diarrhea, unspecified: Secondary | ICD-10-CM

## 2015-06-01 NOTE — Telephone Encounter (Signed)
Herbert Seta, RN with [facility] called requesting verbal orders for Speech therapy for cognition and medical social worker for community resources.  Home health nurse will make a total of 5 visits.  The fifth visit patient will be discharge. Patient will be set up for Home Delivery Pharmacy that will deliver her pill box.  The will deliver once a week or every 2 weeks. The pharmacy will also do same day delivery if there is a medication change.  Patient also request refills on Imodium and Epi Pen.  These medications have expired.   Clovis Pu, RN

## 2015-06-02 MED ORDER — EPINEPHRINE 0.3 MG/0.3ML IJ SOAJ: 0.3000 mg | Freq: Once | INTRAMUSCULAR | Status: AC

## 2015-06-02 MED ORDER — LOPERAMIDE HCL 2 MG PO CAPS: 2.0000 mg | ORAL_CAPSULE | ORAL | Status: DC | PRN

## 2015-06-02 NOTE — Telephone Encounter (Signed)
Left voice message for Herbert Seta, RN with [facility] to confirm if she had any questions regarding the verbal orders given by Dr. Althea Charon.  Clovis Pu, RN

## 2015-06-02 NOTE — Telephone Encounter (Signed)
Called Heather RN back [phone]), did not reach her but LVM advising that I would like to provide verbal authorization for the orders of Speech Therapy for cognition and Medical Social Worker for [employer], also with Home Health nurse visits, Home Delivery Pharmacy with pill box. I agree with all of these recommendations and would like to proceed. Additionally, I sent e-script refills for the requested Imodium and Epi Pen already.  Given my contact info if she needs to talk to me and the voicemail verbal authorization does not suffice. Otherwise, follow-up with [place] if anything else is needed.  Saralyn Pilar, DO [facility], PGY-3

## 2015-06-03 ENCOUNTER — Other Ambulatory Visit: Payer: Self-pay | Admitting: Family Medicine

## 2015-06-03 DIAGNOSIS — I10 Essential (primary) hypertension: Secondary | ICD-10-CM

## 2015-06-04 ENCOUNTER — Ambulatory Visit: Payer: Medicare Other

## 2015-07-09 ENCOUNTER — Telehealth: Payer: Self-pay | Admitting: *Deleted

## 2015-07-09 NOTE — Telephone Encounter (Signed)
Herbert SetaHeather, RN with [facility] called to request verbal order for urine culture and sensitivity.  Patient is having burning and pain with urination with chills at times x 1 week.  Heather as already collected the urine sample.  She need order to process culture. Please call [phone].  Clovis PuMartin, Tamika L, RN

## 2015-07-09 NOTE — Telephone Encounter (Signed)
Called Heather RN back. Did not reach again, LVM giving verbal authorization [facility] within next 2 weeks. Okay to DC after that if appropriate. Call back if needed.  Saralyn PilarAlexander Hiren Peplinski, DO [facility], PGY-3

## 2015-07-09 NOTE — Telephone Encounter (Signed)
Herbert SetaHeather, RN with [facility] called again requesting verbal order for one more nurse visit in 2 weeks.  After the last visit patient will be possibly discharged from home care services.  Please give her a call at [phone].  Clovis PuMartin, Railyn House L, RN

## 2015-07-09 NOTE — Telephone Encounter (Signed)
Roney Mansalled Heather, RN back at [facility]. Unable to reach her, but left voicemail for her, stating that I received her message, agree and authorize a verbal order for her to process the urine culture. Call back with any questions.  Saralyn PilarAlexander Zoelle Markus, DO [facility], PGY-3

## 2015-07-20 ENCOUNTER — Other Ambulatory Visit: Payer: Self-pay | Admitting: Family Medicine

## 2015-07-20 DIAGNOSIS — R197 Diarrhea, unspecified: Secondary | ICD-10-CM

## 2015-07-21 ENCOUNTER — Other Ambulatory Visit: Payer: Self-pay | Admitting: *Deleted

## 2015-07-21 DIAGNOSIS — E785 Hyperlipidemia, unspecified: Secondary | ICD-10-CM

## 2015-07-21 MED ORDER — SIMVASTATIN 20 MG PO TABS: ORAL_TABLET | ORAL | Status: DC

## 2015-07-22 ENCOUNTER — Other Ambulatory Visit: Payer: Self-pay | Admitting: Family Medicine

## 2015-07-22 DIAGNOSIS — K219 Gastro-esophageal reflux disease without esophagitis: Secondary | ICD-10-CM

## 2015-09-17 ENCOUNTER — Ambulatory Visit
Admission: RE | Admit: 2015-09-17 | Discharge: 2015-09-17 | Disposition: A | Discharge status: Home / Self Care | Payer: Medicare Other | Source: Ambulatory Visit | Attending: Family Medicine | Admitting: Family Medicine

## 2015-09-17 ENCOUNTER — Other Ambulatory Visit: Payer: Self-pay | Admitting: Family Medicine

## 2015-09-17 DIAGNOSIS — T148XXA Other injury of unspecified body region, initial encounter: Secondary | ICD-10-CM

## 2015-10-02 ENCOUNTER — Other Ambulatory Visit: Payer: Self-pay | Admitting: Family Medicine

## 2015-10-02 DIAGNOSIS — E785 Hyperlipidemia, unspecified: Secondary | ICD-10-CM

## 2015-10-16 ENCOUNTER — Other Ambulatory Visit: Payer: Self-pay | Admitting: *Deleted

## 2015-10-16 DIAGNOSIS — M25512 Pain in left shoulder: Secondary | ICD-10-CM

## 2015-10-19 ENCOUNTER — Other Ambulatory Visit: Payer: Self-pay | Admitting: Family Medicine

## 2015-10-19 MED ORDER — TRAMADOL HCL 50 MG PO TABS: ORAL_TABLET | ORAL | Status: AC

## 2015-10-19 NOTE — Telephone Encounter (Signed)
Phoned in refill Tramadol.  Saralyn PilarAlexander Meilin Brosh, DO [facility], PGY-3

## 2015-11-04 ENCOUNTER — Observation Stay (HOSPITAL_COMMUNITY)
Admission: EM | Admit: 2015-11-04 | Discharge: 2015-11-05 | Disposition: A | Discharge status: Home / Self Care | Payer: Medicare Other | Attending: Family Medicine | Admitting: Family Medicine

## 2015-11-04 ENCOUNTER — Encounter (HOSPITAL_COMMUNITY): Payer: Self-pay

## 2015-11-04 ENCOUNTER — Observation Stay (HOSPITAL_COMMUNITY): Payer: Medicare Other

## 2015-11-04 ENCOUNTER — Emergency Department (HOSPITAL_COMMUNITY): Payer: Medicare Other

## 2015-11-04 DIAGNOSIS — R531 Weakness: Secondary | ICD-10-CM | POA: Diagnosis not present

## 2015-11-04 DIAGNOSIS — E785 Hyperlipidemia, unspecified: Secondary | ICD-10-CM | POA: Diagnosis not present

## 2015-11-04 DIAGNOSIS — N183 Chronic kidney disease, stage 3 (moderate): Secondary | ICD-10-CM | POA: Insufficient documentation

## 2015-11-04 DIAGNOSIS — F329 Major depressive disorder, single episode, unspecified: Secondary | ICD-10-CM | POA: Diagnosis not present

## 2015-11-04 DIAGNOSIS — R51 Headache: Secondary | ICD-10-CM | POA: Insufficient documentation

## 2015-11-04 DIAGNOSIS — R262 Difficulty in walking, not elsewhere classified: Secondary | ICD-10-CM | POA: Diagnosis not present

## 2015-11-04 DIAGNOSIS — E079 Disorder of thyroid, unspecified: Secondary | ICD-10-CM | POA: Diagnosis not present

## 2015-11-04 DIAGNOSIS — W19XXXA Unspecified fall, initial encounter: Secondary | ICD-10-CM | POA: Diagnosis not present

## 2015-11-04 DIAGNOSIS — R55 Syncope and collapse: Principal | ICD-10-CM | POA: Insufficient documentation

## 2015-11-04 DIAGNOSIS — M1990 Unspecified osteoarthritis, unspecified site: Secondary | ICD-10-CM | POA: Insufficient documentation

## 2015-11-04 DIAGNOSIS — I129 Hypertensive chronic kidney disease with stage 1 through stage 4 chronic kidney disease, or unspecified chronic kidney disease: Secondary | ICD-10-CM | POA: Diagnosis not present

## 2015-11-04 DIAGNOSIS — Z7982 Long term (current) use of aspirin: Secondary | ICD-10-CM | POA: Diagnosis not present

## 2015-11-04 DIAGNOSIS — G629 Polyneuropathy, unspecified: Secondary | ICD-10-CM | POA: Insufficient documentation

## 2015-11-04 DIAGNOSIS — R296 Repeated falls: Secondary | ICD-10-CM

## 2015-11-04 DIAGNOSIS — M159 Polyosteoarthritis, unspecified: Secondary | ICD-10-CM | POA: Insufficient documentation

## 2015-11-04 DIAGNOSIS — F0390 Unspecified dementia without behavioral disturbance: Secondary | ICD-10-CM | POA: Insufficient documentation

## 2015-11-04 LAB — BASIC METABOLIC PANEL
ANION GAP: 6 (ref 5–15)
BUN: 17 mg/dL (ref 6–20)
CHLORIDE: 108 mmol/L (ref 101–111)
CO2: 26 mmol/L (ref 22–32)
Calcium: 9.1 mg/dL (ref 8.9–10.3)
Creatinine, Ser: 1.37 mg/dL — ABNORMAL HIGH (ref 0.44–1.00)
GFR, EST AFRICAN AMERICAN: 41 mL/min — AB (ref >60)
GFR, EST NON AFRICAN AMERICAN: 35 mL/min — AB (ref >60)
Glucose, Bld: 93 mg/dL (ref 65–99)
POTASSIUM: 3.1 mmol/L — AB (ref 3.5–5.1)
SODIUM: 140 mmol/L (ref 135–145)

## 2015-11-04 LAB — I-STAT TROPONIN, ED: TROPONIN I, POC: 0.01 ng/mL (ref 0.00–0.08)

## 2015-11-04 LAB — CBG MONITORING, ED: Glucose-Capillary: 100 mg/dL — ABNORMAL HIGH (ref 65–99)

## 2015-11-04 LAB — URINE MICROSCOPIC-ADD ON: RBC / HPF: NONE SEEN RBC/hpf (ref 0–5)

## 2015-11-04 LAB — URINALYSIS, ROUTINE W REFLEX MICROSCOPIC
Bilirubin Urine: NEGATIVE
GLUCOSE, UA: NEGATIVE mg/dL
Hgb urine dipstick: NEGATIVE
KETONES UR: NEGATIVE mg/dL
NITRITE: NEGATIVE
PH: 7 (ref 5.0–8.0)
PROTEIN: NEGATIVE mg/dL
Specific Gravity, Urine: 1.009 (ref 1.005–1.030)

## 2015-11-04 LAB — CBC
HEMATOCRIT: 38.9 % (ref 36.0–46.0)
HEMOGLOBIN: 12.6 g/dL (ref 12.0–15.0)
MCH: 30.1 pg (ref 26.0–34.0)
MCHC: 32.4 g/dL (ref 30.0–36.0)
MCV: 92.8 fL (ref 78.0–100.0)
Platelets: 231 10*3/uL (ref 150–400)
RBC: 4.19 MIL/uL (ref 3.87–5.11)
RDW: 13.1 % (ref 11.5–15.5)
WBC: 5.6 10*3/uL (ref 4.0–10.5)

## 2015-11-04 LAB — SEDIMENTATION RATE: SED RATE: 40 mm/h — AB (ref 0–22)

## 2015-11-04 MED ORDER — SIMVASTATIN 20 MG PO TABS
20.0000 mg | ORAL_TABLET | Freq: Every day | ORAL | Status: DC
  Administered 2015-11-04: 20 mg via ORAL
  Filled 2015-11-04: qty 1

## 2015-11-04 MED ORDER — ASPIRIN EC 81 MG PO TBEC
81.0000 mg | DELAYED_RELEASE_TABLET | Freq: Every day | ORAL | Status: DC
  Administered 2015-11-04 – 2015-11-05 (×2): 81 mg via ORAL
  Filled 2015-11-04 (×2): qty 1

## 2015-11-04 MED ORDER — AMLODIPINE BESYLATE 10 MG PO TABS
10.0000 mg | ORAL_TABLET | Freq: Every day | ORAL | Status: DC
  Administered 2015-11-05: 10 mg via ORAL
  Filled 2015-11-04: qty 1

## 2015-11-04 MED ORDER — DEXTROSE 5 % IV SOLN
1.0000 g | Freq: Once | INTRAVENOUS | Status: AC
  Administered 2015-11-04: 1 g via INTRAVENOUS
  Filled 2015-11-04: qty 10

## 2015-11-04 MED ORDER — ENOXAPARIN SODIUM 30 MG/0.3ML ~~LOC~~ SOLN
30.0000 mg | SUBCUTANEOUS | Status: DC
  Administered 2015-11-04: 30 mg via SUBCUTANEOUS
  Filled 2015-11-04: qty 0.3

## 2015-11-04 MED ORDER — POTASSIUM CHLORIDE CRYS ER 20 MEQ PO TBCR
40.0000 meq | EXTENDED_RELEASE_TABLET | Freq: Once | ORAL | Status: AC
  Administered 2015-11-04: 40 meq via ORAL
  Filled 2015-11-04: qty 2

## 2015-11-04 MED ORDER — PANTOPRAZOLE SODIUM 40 MG PO TBEC
40.0000 mg | DELAYED_RELEASE_TABLET | Freq: Every day | ORAL | Status: DC
  Administered 2015-11-05: 40 mg via ORAL
  Filled 2015-11-04: qty 1

## 2015-11-04 MED ORDER — TETRACAINE HCL 0.5 % OP SOLN
2.0000 [drp] | Freq: Once | OPHTHALMIC | Status: AC
  Administered 2015-11-04: 2 [drp] via OPHTHALMIC
  Filled 2015-11-04: qty 2

## 2015-11-04 MED ORDER — SODIUM CHLORIDE 0.9 % IV SOLN
INTRAVENOUS | Status: DC
  Administered 2015-11-04: 1000 mL via INTRAVENOUS

## 2015-11-04 MED ORDER — GABAPENTIN 100 MG PO CAPS
100.0000 mg | ORAL_CAPSULE | Freq: Three times a day (TID) | ORAL | Status: DC
  Administered 2015-11-04 – 2015-11-05 (×3): 100 mg via ORAL
  Filled 2015-11-04 (×3): qty 1

## 2015-11-04 MED ORDER — DONEPEZIL HCL 5 MG PO TABS
5.0000 mg | ORAL_TABLET | Freq: Every evening | ORAL | Status: DC
  Administered 2015-11-04: 5 mg via ORAL
  Filled 2015-11-04: qty 1

## 2015-11-04 MED ORDER — SODIUM CHLORIDE 0.9 % IV BOLUS (SEPSIS)
500.0000 mL | Freq: Once | INTRAVENOUS | Status: AC
  Administered 2015-11-04: 500 mL via INTRAVENOUS

## 2015-11-04 MED ORDER — HYDROCHLOROTHIAZIDE 12.5 MG PO CAPS
12.5000 mg | ORAL_CAPSULE | Freq: Every day | ORAL | Status: DC
  Administered 2015-11-05: 12.5 mg via ORAL
  Filled 2015-11-04: qty 1

## 2015-11-04 NOTE — Progress Notes (Signed)
MRI called stating that they were on their way to pickup pt for MRI Brain.  Cardiac telemetry has been temporarily removed and CCMD notified.

## 2015-11-04 NOTE — ED Notes (Signed)
Patient had syncopal event yesterday and having weakness with same. denies pain. Reports that she fell inside her home. Alert to person and place. MAEx 4

## 2015-11-04 NOTE — H&P (Signed)
Family Medicine [facility] Admission History and Physical Service Pager: [phone]  Patient name: Destiny Moore Medical record number: [id number] Date of birth: 01-13-1933 Age: 82 y.o. Gender: female  Primary Care Provider: Tarri AbernethyAbigail J Lancaster, MD Consultants: Neuro Code Status: DNI  Chief Complaint: syncope  Assessment and Plan: Destiny Loravelyn L Acy is a 82 y.o. female presenting with syncope . PMH is significant for HTN, HLD, depression, Stage 3 CKD, OA.  # Syncope: With accompanying L-sided weakness, L-sided headache, and blurred vision, concern for TIA. CT head with mild atrophy and small vessel disease but no acute infarct, mass, or hemorrhage. EKG NSR. Normotensive upon presentation. No hypoglycemia (CBG 100 in ED). No abnormalities on neuro exam.   - Place in observation, attending Dr. Randolm IdolFletke - Neuro consult - appreciate recommendations - Bilateral carotid Dopplers - Echo - MRI brain W/WO contrast - Risk stratification labs  - Telemetry - Neuro checks q2  # Left sided weakness: Present for a few months. LLE slightly weaker than RLE (4/5 compared to 5/5 respectively) on physical exam, however patient able to ambulate without assistance with no gait abnormalities. Possibly 2/2 CVA, although patient also with osteoarthritis that could be causing decreased strength 2/2 pain. Weakness not limiting patient's daily activities. Does not recall any recent.  - Neuro checks q2 - PT/OT eval  # HTN: Normotensive since admission.  - Continue home amlodipine, HCTZ  # HLD - Continue home simvastatin - F/u lipid panel  # Memory loss: Chronic.  - Continue home Aricept   FEN/GI: heart healthy diet, Protonix, NS@75cc /hr Prophylaxis: Lovenox  Disposition: place in observation for TIA work-up  History of Present Illness:  Destiny Loravelyn L Legendre is a 82 y.o. female presenting with syncope.   Patient presents after two episodes of syncope this week, most recently yesterday. Both  times, the patient was in a hot kitchen prior to the event. She remembers feeling very hot and weak both times. Her husband was in the same room for both events, and recalls seeing the patient slumps over against either the kitchen sink or the wall and slide down onto the ground. She did not hit her head either time. Husband reports the patient was unresponsive each time until he placed a cool washcloth on her head, at which point she became responsive but was very disoriented. Patient called her PCP this AM to ask if she should be seen in office, and PCP told her to come to the ED instead. Of note, patient reports 6 other similar incidents in the past year, although she did not fully lose consciousness and was less disoriented during these events.   This AM, patient had a severe L sided throbbing headache, as well as blurred vision bilaterally. Headache resolved with Tylenol in ED. Patient denies any visual disturbances other than today.   Patient also reporting L sided weakness of upper and lower extremities. She did not initially notice the weakness until a physical therapist mentioned it to her. She thinks the weakness has been present for at least a few months. Her daily activities are not affected by the weakness. Patient denies dysarthria, numbness. Does endorse some tingling but reports she has history of neuropathy.   Review Of Systems: Per HPI with the following additions: no palpitations, chest pain, tachycardia.  Otherwise the remainder of the systems were negative.  Patient Active Problem List   Diagnosis Date Noted  . Syncope 11/04/2015  . Shingles 01/15/2015  . Acute cystitis 12/22/2014  . Bilateral lower extremity  edema 12/22/2014  . Other chest pain 12/09/2014  . Multiple thyroid nodules 12/09/2014  . Urinary incontinence 11/13/2014  . Degenerative arthritis of left shoulder region 11/13/2014  . Recurrent falls 11/06/2014    Class: Acute  . Epistaxis 08/01/2014  . S/P  laparoscopic cholecystectomy 05/14/2014  . Memory loss 05/14/2014  . Common bile duct dilation   . Calculus of bile duct with obstruction and without cholangitis or cholecystitis   . Abdominal pain 05/05/2014  . Biliary stone 05/05/2014  . RUQ abdominal pain 05/05/2014  . GERD (gastroesophageal reflux disease) 04/17/2014  . Cholelithiasis 04/17/2014  . Overactive bladder 09/20/2013  . Bilateral lower extremity pain 08/21/2013  . Constipation 01/20/2011  . Abnormal gait 01/20/2011  . Depression 07/14/2010  . CKD (chronic kidney disease) stage 3, GFR 30-59 ml/min 06/04/2010  . UNSPECIFIED GENITAL PROLAPSE 05/27/2010  . Hyperlipidemia 06/17/2009  . PTSD 06/17/2009  . Essential hypertension 06/17/2009  . Osteoarthritis of multiple joints 06/17/2009  . Osteoporosis 06/17/2009    Past Medical History: Past Medical History  Diagnosis Date  . Hypertension   . Hyperlipidemia   . Anxiety   . UTI (urinary tract infection)   . Depression   . Osteoporosis   . Allergy   . CKD (chronic kidney disease)     stage 3 as of 04/2014.   Marland Kitchen Arthritis     Past Surgical History: Past Surgical History  Procedure Laterality Date  . Hystrectomy    . Bladder tact    . Abdominal hysterectomy    . Incontinence surgery    . Cholecystectomy N/A 05/08/2014    Procedure: LAPAROSCOPIC CHOLECYSTECTOMY WITH INTRAOPERATIVE CHOLANGIOGRAM;  Surgeon: Manus Rudd, MD;  Location: [facility] OR;  Service: General;  Laterality: N/A;    Social History: Social History  Substance Use Topics  . Smoking status: Never Smoker   . Smokeless tobacco: Never Used  . Alcohol Use: No   Please also refer to relevant sections of EMR.  Family History: Family History  Problem Relation Age of Onset  . Heart disease Mother   . Cancer Sister     breast  . Cancer Brother     skin, kidney, lung    Allergies and Medications: Allergies  Allergen Reactions  . Carvedilol Anaphylaxis  . Coreg Anaphylaxis  . Lisinopril  Anaphylaxis  . Sertraline Hcl Other (See Comments)    Could not feed herself (no hand to mouth co-ordination)   No current facility-administered medications on file prior to encounter.   Current Outpatient Prescriptions on File Prior to Encounter  Medication Sig Dispense Refill  . acetaminophen (TYLENOL) 500 MG tablet Take 1 tablet (500 mg total) by mouth every 6 (six) hours as needed. 30 tablet 0  . alendronate (FOSAMAX) 70 MG tablet Take 70 mg by mouth every Monday. Take with a full glass of water on an empty stomach.    Marland Kitchen amLODipine (NORVASC) 10 MG tablet Take 1 tablet by mouth daily 90 tablet 2  . Ascorbic Acid (VITAMIN C PO) Take 1 tablet by mouth daily.    Marland Kitchen aspirin EC 81 MG tablet Take 81 mg by mouth daily.    . cholecalciferol (VITAMIN D) 1000 UNITS tablet Take 1,000 Units by mouth daily.    Marland Kitchen CRANBERRY PO Take 1 tablet by mouth daily.    . Cyanocobalamin (VITAMIN B-12 PO) Take 1 tablet by mouth daily.    Marland Kitchen EPINEPHrine 0.3 mg/0.3 mL IJ SOAJ injection Inject 0.3 mLs (0.3 mg total) into the muscle once. (Patient  taking differently: Inject 0.3 mg into the muscle once as needed (allergic reaction). ) 1 Device 1  . gabapentin (NEURONTIN) 100 MG capsule TAKE 1 CAPSULE BY MOUTH THREE TIMES DAILY (Patient taking differently: take 300mg  by mouth at bedtime) 90 capsule 3  . hydrochlorothiazide (MICROZIDE) 12.5 MG capsule Take 12.5 mg by mouth daily.    Marland Kitchen HYDROcodone-acetaminophen (NORCO/VICODIN) 5-325 MG tablet Take 1 tablet by mouth every 8 (eight) hours as needed for moderate pain. 90 tablet 0  . loperamide (IMODIUM) 2 MG capsule take 1 capsule BY MOUTH AS NEEDED for diarrhea or loose stools 30 capsule 0  . OVER THE COUNTER MEDICATION Place 1 drop into both eyes 2 (two) times daily as needed (dry eyes/burning). Over the counter lubricating eye drops    . pantoprazole (PROTONIX) 40 MG tablet Take 1 tablet by mouth daily 30 tablet 5  . simvastatin (ZOCOR) 20 MG tablet Take 1 tablet by mouth  daily 90 tablet 1  . traMADol (ULTRAM) 50 MG tablet Take 1-2 tablets by mouth every 12 hours as needed for pain 30 tablet 0    Objective: BP 140/63 mmHg  Pulse 58  Temp(Src) 97.9 F (36.6 C) (Oral)  Resp 16  Ht 5\' 1"  (1.549 m)  Wt 131 lb 9.6 oz (59.693 kg)  BMI 24.88 kg/m2  SpO2 99% Exam: General: well-appearing elderly female sitting up in bed in NAD Eyes: PERRLA, no scleral icterus, EOMI ENTM: MMM, no oropharyngeal erythema or exudates Neck: supple, no cervical lymphadenopathy Cardiovascular: RRR, no murmurs appreciated, no LE edema, +DP pulses bilaterally Respiratory: CTAB, normal WOB on RA, no wheezes or rhonchi Abdomen: soft, non-tender, non-distended, +BS MSK: 4/5 strength LLE, 5/5 strength RLE; 5/5 strength upper and lower extremities; able to sit up and walk unassisted with no gait abnormalities  Skin: no rashes or bruises noted Neuro: CN II-XII grossly intact, no focal deficits, A&Ox3 Psych: appropriate mood and affect  Labs and Imaging: CBC BMET   Recent Labs Lab 11/04/15 0957  WBC 5.6  HGB 12.6  HCT 38.9  PLT 231    Recent Labs Lab 11/04/15 0957  NA 140  K 3.1*  CL 108  CO2 26  BUN 17  CREATININE 1.37*  GLUCOSE 93  CALCIUM 9.1     Dg Chest 2 View  11/04/2015  CLINICAL DATA:  Syncope yesterday.  Shortness of breath. EXAM: CHEST  2 VIEW COMPARISON:  04/06/2015 fell FINDINGS: The heart size and mediastinal contours are within normal limits. Both lungs are clear. The visualized skeletal structures are unremarkable. IMPRESSION: No active cardiopulmonary disease. Electronically Signed   By: Kennith Center M.D.   On: 11/04/2015 11:59   Ct Head Wo Contrast  11/04/2015  CLINICAL DATA:  Syncope with fall EXAM: CT HEAD WITHOUT CONTRAST CT CERVICAL SPINE WITHOUT CONTRAST TECHNIQUE: Multidetector CT imaging of the head and cervical spine was performed following the standard protocol without intravenous contrast. Multiplanar CT image reconstructions of the cervical  spine were also generated. COMPARISON:  Head CT Sep 17, 2015 FINDINGS: CT HEAD FINDINGS Mild diffuse atrophy is stable. There is no intracranial mass, hemorrhage, extra-axial fluid collection, or midline shift. There is mild small vessel disease in the centra semiovale bilaterally. Elsewhere gray-white compartments appear normal. No acute infarct evident. There is calcification in both cavernous carotid artery regions. The bony calvarium appears intact. The mastoid air cells are clear. There is opacification in portions of the sphenoid sinuses bilaterally as well as in several ethmoid air cells. No intraorbital lesions  are appreciable. CT CERVICAL SPINE FINDINGS There is no fracture or spondylolisthesis. Prevertebral soft tissues and predental space regions are normal. There is moderately severe disc space narrowing at C4-5 and C5-6. There is slightly milder narrowing at C3-4. There is facet hypertrophy at multiple levels bilaterally. There is exit foraminal narrowing due to bony hypertrophy on the left at C3-4, on the left at C4-5, and on the left at C5-6. There is a dominant mass arising from the right lobe of the thyroid measuring 2.6 x 2.3 cm. This mass is of low attenuation and may represent a large colloid cyst. There is inhomogeneity in the attenuation of the left lobe of the thyroid. There are foci of atherosclerotic calcification in the carotid arteries bilaterally. IMPRESSION: CT head: Mild diffuse atrophy with mild periventricular small vessel disease. No intracranial mass, hemorrhage, or extra-axial fluid collection. No acute infarct. Areas of paranasal sinus disease. Cavernous carotid artery calcification bilaterally. CT cervical spine: No demonstrable fracture or spondylolisthesis. Multilevel arthropathy. There is bilateral carotid artery calcification. 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 Given the size of this thyroid mass, correlation with ultrasound advised. There is also  inhomogeneity in the left lobe of the thyroid. Electronically Signed   By: Bretta BangWilliam  Woodruff III M.D.   On: 11/04/2015 12:16   Ct Cervical Spine Wo Contrast  11/04/2015  CLINICAL DATA:  Syncope with fall EXAM: CT HEAD WITHOUT CONTRAST CT CERVICAL SPINE WITHOUT CONTRAST TECHNIQUE: Multidetector CT imaging of the head and cervical spine was performed following the standard protocol without intravenous contrast. Multiplanar CT image reconstructions of the cervical spine were also generated. COMPARISON:  Head CT Sep 17, 2015 FINDINGS: CT HEAD FINDINGS Mild diffuse atrophy is stable. There is no intracranial mass, hemorrhage, extra-axial fluid collection, or midline shift. There is mild small vessel disease in the centra semiovale bilaterally. Elsewhere gray-white compartments appear normal. No acute infarct evident. There is calcification in both cavernous carotid artery regions. The bony calvarium appears intact. The mastoid air cells are clear. There is opacification in portions of the sphenoid sinuses bilaterally as well as in several ethmoid air cells. No intraorbital lesions are appreciable. CT CERVICAL SPINE FINDINGS There is no fracture or spondylolisthesis. Prevertebral soft tissues and predental space regions are normal. There is moderately severe disc space narrowing at C4-5 and C5-6. There is slightly milder narrowing at C3-4. There is facet hypertrophy at multiple levels bilaterally. There is exit foraminal narrowing due to bony hypertrophy on the left at C3-4, on the left at C4-5, and on the left at C5-6. There is a dominant mass arising from the right lobe of the thyroid measuring 2.6 x 2.3 cm. This mass is of low attenuation and may represent a large colloid cyst. There is inhomogeneity in the attenuation of the left lobe of the thyroid. There are foci of atherosclerotic calcification in the carotid arteries bilaterally. IMPRESSION: CT head: Mild diffuse atrophy with mild periventricular small vessel  disease. No intracranial mass, hemorrhage, or extra-axial fluid collection. No acute infarct. Areas of paranasal sinus disease. Cavernous carotid artery calcification bilaterally. CT cervical spine: No demonstrable fracture or spondylolisthesis. Multilevel arthropathy. There is bilateral carotid artery calcification. 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 Given the size of this thyroid mass, correlation with ultrasound advised. There is also inhomogeneity in the left lobe of the thyroid. Electronically Signed   By: Bretta BangWilliam  Woodruff III M.D.   On: 11/04/2015 12:16     Marquette SaaAbigail Joseph Lancaster, MD 11/04/2015, 6:30 PM  PGY-2, [place] Intern pager: [phone], text pages welcome

## 2015-11-04 NOTE — ED Notes (Signed)
Patient CBG was 100 the nurse was informed.

## 2015-11-04 NOTE — ED Provider Notes (Signed)
CSN: [id number]     Arrival date & time 11/04/15  4098 History   First MD Initiated Contact with Patient 11/04/15 1013     Chief Complaint  Patient presents with  . Loss of Consciousness  . Weakness     (Consider location/radiation/quality/duration/timing/severity/associated sxs/prior Treatment) HPI Comments: 82 year old female with a history of HTN, Hyperlipidemia, CKD stage 3, and Anxiety presents with a chief complaint of syncope that occurred yesterday morning.  At the time of LOC, she notes she was standing in the kitchen when she felt light-headed, at which point she lowered herself into a chair and loss consciousness per her husband for no more than a couple minutes. She denies hitting her head or any other injury prior to or as result of LOC.  She also complains of left sided weakness, intermittent left-sided headache, nausea, SOB, and fatigue x1 week.  She has taken Tylenol for her headache, which provides some short-term relief.  She denies fever, chest pain, vomiting, diarrhea, or urinary symptoms.  The patient also denies recent illness, prior history of syncope, or change in medications.   Past Medical History  Diagnosis Date  . Hypertension   . Hyperlipidemia   . Anxiety   . UTI (urinary tract infection)   . Depression   . Osteoporosis   . Allergy   . CKD (chronic kidney disease)     stage 3 as of 04/2014.   Marland Kitchen Arthritis    Past Surgical History  Procedure Laterality Date  . Hystrectomy    . Bladder tact    . Abdominal hysterectomy    . Incontinence surgery    . Cholecystectomy N/A 05/08/2014    Procedure: LAPAROSCOPIC CHOLECYSTECTOMY WITH INTRAOPERATIVE CHOLANGIOGRAM;  Surgeon: Manus Rudd, MD;  Location: [facility] OR;  Service: General;  Laterality: N/A;   Family History  Problem Relation Age of Onset  . Heart disease Mother   . Cancer Sister     breast  . Cancer Brother     skin, kidney, lung   Social History  Substance Use Topics  . Smoking status: Never Smoker    . Smokeless tobacco: Never Used  . Alcohol Use: No   OB History    No data available     Review of Systems  All other systems reviewed and are negative.     Allergies  Carvedilol; Coreg; Lisinopril; and Sertraline hcl  Home Medications   Prior to Admission medications   Medication Sig Start Date End Date Taking? Authorizing Provider  acetaminophen (TYLENOL) 500 MG tablet Take 1 tablet (500 mg total) by mouth every 6 (six) hours as needed. 03/07/15   Cheri Fowler, PA-C  alendronate (FOSAMAX) 70 MG tablet Take 70 mg by mouth every Monday. Take with a full glass of water on an empty stomach.    Historical Provider, MD  amLODipine (NORVASC) 10 MG tablet Take 1 tablet by mouth daily 06/03/15   Smitty Cords, DO  Ascorbic Acid (VITAMIN C PO) Take 1 tablet by mouth daily.    Historical Provider, MD  aspirin EC 81 MG tablet Take 81 mg by mouth daily.    Historical Provider, MD  cholecalciferol (VITAMIN D) 1000 UNITS tablet Take 1,000 Units by mouth daily.    Historical Provider, MD  CRANBERRY PO Take 1 tablet by mouth daily.    Historical Provider, MD  Cyanocobalamin (VITAMIN B-12 PO) Take 1 tablet by mouth daily.    Historical Provider, MD  EPINEPHrine 0.3 mg/0.3 mL IJ SOAJ injection  Inject 0.3 mLs (0.3 mg total) into the muscle once. 06/02/15   Smitty CordsAlexander J Karamalegos, DO  gabapentin (NEURONTIN) 100 MG capsule TAKE 1 CAPSULE BY MOUTH THREE TIMES DAILY 10/19/15   Smitty CordsAlexander J Karamalegos, DO  hydrochlorothiazide (MICROZIDE) 12.5 MG capsule Take 12.5 mg by mouth daily. 01/07/15   Historical Provider, MD  HYDROcodone-acetaminophen (NORCO/VICODIN) 5-325 MG tablet Take 1 tablet by mouth every 8 (eight) hours as needed for moderate pain. 02/27/15   Smitty CordsAlexander J Karamalegos, DO  loperamide (IMODIUM) 2 MG capsule take 1 capsule BY MOUTH AS NEEDED for diarrhea or loose stools 07/20/15   Smitty CordsAlexander J Karamalegos, DO  OVER THE COUNTER MEDICATION Place 1 drop into both eyes 2 (two) times daily as  needed (dry eyes/burning). Over the counter lubricating eye drops    Historical Provider, MD  pantoprazole (PROTONIX) 40 MG tablet Take 1 tablet by mouth daily 07/22/15   Smitty CordsAlexander J Karamalegos, DO  simvastatin (ZOCOR) 20 MG tablet Take 1 tablet by mouth daily 10/02/15   Smitty CordsAlexander J Karamalegos, DO  traMADol Janean Sark(ULTRAM) 50 MG tablet Take 1-2 tablets by mouth every 12 hours as needed for pain 10/19/15   Netta NeatAlexander J Karamalegos, DO   BP 138/74 mmHg  Pulse 65  Temp(Src) 97.9 F (36.6 C) (Oral)  Resp 18  Ht 5\' 2"  (1.575 m)  Wt 62.143 kg  BMI 25.05 kg/m2  SpO2 99% Physical Exam  Constitutional: She appears well-developed and well-nourished.  HENT:  Head: Normocephalic and atraumatic.  Eyes: EOM are normal. Pupils are equal, round, and reactive to light.  Neck: Normal range of motion. Neck supple.  Cardiovascular: Normal rate, regular rhythm and normal heart sounds.   Pulmonary/Chest: Effort normal and breath sounds normal.  Abdominal: Soft. Bowel sounds are normal.  Musculoskeletal: Normal range of motion.  Neurological: She is alert. No cranial nerve deficit. Gait normal.  LUE muscle strength 4/5 RUE muscle strength 5/5 LLE muscle strength 3/5 RLE muscle strength 5/5 Tremor of the LUE with finger to nose testing Normal finger to nose testing of RUE  Skin: Skin is warm and dry.  Psychiatric: She has a normal mood and affect.  Nursing note and vitals reviewed.   ED Course  Procedures (including critical care time) Labs Review Labs Reviewed  BASIC METABOLIC PANEL - Abnormal; Notable for the following:    Potassium 3.1 (*)    Creatinine, Ser 1.37 (*)    GFR calc non Af Amer 35 (*)    GFR calc Af Amer 41 (*)    All other components within normal limits  CBC  URINALYSIS, ROUTINE W REFLEX MICROSCOPIC (NOT AT [facility])  CBG MONITORING, ED    Imaging Review I have personally reviewed and evaluated these images and lab results as part of my medical decision-making.   EKG  Interpretation   Date/Time:  Wednesday November 04 2015 09:52:02 EDT Ventricular Rate:  68 PR Interval:  154 QRS Duration: 82 QT Interval:  400 QTC Calculation: 425 R Axis:   5 Text Interpretation:  Normal sinus rhythm Normal ECG No significant change  was found Confirmed by Manus GunningANCOUR  MD, STEPHEN [phone]([id number]) on 11/04/2015 11:01:34  AM      MDM   Final diagnoses:  None   Patient is a 82 year old female who presents today after a syncopal episode that occurred yesterday morning.  She states that she was standing for a long period of time and then began to feel dizzy and lowered herself to a chair.  She did not  fall or hit her head.  On exam, she does have some left sided weakness.  It is unclear who long this weakness has been present.  Patient is not a great historian.  No ischemic changes on EKG.  Initial troponin is negative.  UA showing possible UTI.  Urine cultured.  Patient given IV Rocephin in the ED.  No acute changes on Head CT.  [facility] for further evaluation of syncope and CVA rule out.    Santiago GladHeather Matayah Reyburn, PA-C 11/06/15 1635  Glynn OctaveStephen Rancour, MD 11/10/15 1030

## 2015-11-04 NOTE — ED Notes (Signed)
Patient husband was given a cup of coffee.

## 2015-11-04 NOTE — ED Notes (Signed)
Patient transported to X-ray 

## 2015-11-04 NOTE — ED Notes (Signed)
Attempted report x1. 

## 2015-11-04 NOTE — ED Notes (Signed)
Informed PA that ESR is 40 not 4.

## 2015-11-05 ENCOUNTER — Other Ambulatory Visit: Payer: Self-pay | Admitting: Student

## 2015-11-05 ENCOUNTER — Observation Stay (HOSPITAL_BASED_OUTPATIENT_CLINIC_OR_DEPARTMENT_OTHER): Payer: Medicare Other

## 2015-11-05 DIAGNOSIS — E785 Hyperlipidemia, unspecified: Secondary | ICD-10-CM | POA: Diagnosis not present

## 2015-11-05 DIAGNOSIS — R55 Syncope and collapse: Secondary | ICD-10-CM

## 2015-11-05 DIAGNOSIS — I10 Essential (primary) hypertension: Secondary | ICD-10-CM

## 2015-11-05 DIAGNOSIS — R531 Weakness: Secondary | ICD-10-CM | POA: Insufficient documentation

## 2015-11-05 DIAGNOSIS — R413 Other amnesia: Secondary | ICD-10-CM

## 2015-11-05 DIAGNOSIS — M6289 Other specified disorders of muscle: Secondary | ICD-10-CM

## 2015-11-05 DIAGNOSIS — I129 Hypertensive chronic kidney disease with stage 1 through stage 4 chronic kidney disease, or unspecified chronic kidney disease: Secondary | ICD-10-CM | POA: Diagnosis not present

## 2015-11-05 DIAGNOSIS — N183 Chronic kidney disease, stage 3 (moderate): Secondary | ICD-10-CM | POA: Diagnosis not present

## 2015-11-05 LAB — VAS US CAROTID
LCCADSYS: -72 cm/s
LCCAPDIAS: 15 cm/s
LCCAPSYS: 98 cm/s
LEFT ECA DIAS: -5 cm/s
LEFT VERTEBRAL DIAS: -10 cm/s
LICADDIAS: -17 cm/s
LICAPDIAS: -20 cm/s
Left CCA dist dias: -13 cm/s
Left ICA dist sys: -60 cm/s
Left ICA prox sys: -61 cm/s
RCCAPSYS: -84 cm/s
RIGHT ECA DIAS: -9 cm/s
RIGHT VERTEBRAL DIAS: -8 cm/s
Right CCA prox dias: -11 cm/s
Right cca dist sys: -68 cm/s

## 2015-11-05 LAB — ECHOCARDIOGRAM COMPLETE
CHL CUP DOP CALC LVOT VTI: 21.1 cm
CHL CUP MV DEC (S): 271
E/e' ratio: 9.56
EWDT: 271 ms
FS: 39 % (ref 28–44)
Height: 61 in
IV/PV OW: 1.07
LA diam index: 2.09 cm/m2
LA vol: 74.5 mL
LASIZE: 33 mm
LAVOLA4C: 87.9 mL
LAVOLIN: 47.2 mL/m2
LEFT ATRIUM END SYS DIAM: 33 mm
LV PW d: 11.9 mm — AB (ref 0.6–1.1)
LV TDI E'MEDIAL: 5.55
LV e' LATERAL: 6.85 cm/s
LVEEAVG: 9.56
LVEEMED: 9.56
LVOT area: 3.46 cm2
LVOT peak vel: 88.7 cm/s
LVOTD: 21 mm
LVOTSV: 73 mL
MVPKAVEL: 99.4 m/s
MVPKEVEL: 65.5 m/s
TDI e' lateral: 6.85
Weight: 2105.6 oz

## 2015-11-05 LAB — LIPID PANEL
Cholesterol: 135 mg/dL (ref 0–200)
HDL: 57 mg/dL (ref >40)
LDL CALC: 55 mg/dL (ref 0–99)
Total CHOL/HDL Ratio: 2.4 RATIO
Triglycerides: 117 mg/dL (ref <150)
VLDL: 23 mg/dL (ref 0–40)

## 2015-11-05 LAB — URINE CULTURE

## 2015-11-05 NOTE — Care Management Obs Status (Signed)
MEDICARE OBSERVATION STATUS NOTIFICATION   Patient Details  Name: Destiny Moore MRN: [id number] Date of Birth: Nov 25, 1932   Medicare Observation Status Notification Given:  Yes (MRI negative)    Kermit BaloKelli F Manie Bealer, RN 11/05/2015, 1:28 PM

## 2015-11-05 NOTE — Progress Notes (Signed)
  Echocardiogram 2D Echocardiogram has been performed.  Mykal Kirchman 11/05/2015, 8:55 AM

## 2015-11-05 NOTE — Discharge Instructions (Signed)

## 2015-11-05 NOTE — Progress Notes (Signed)
Family Medicine Teaching Service Daily Progress Note Intern Pager: [phone]  Patient name: Destiny Loravelyn L Pusch Medical record number: [id number] Date of birth: Jan 20, 1933 Age: 82 y.o. Gender: female  Primary Care Provider: Tarri AbernethyAbigail J Lancaster, MD Consultants: Neurology Code Status: DNI  Pt Overview and Major Events to Date:  7/5 admitted for syncope  Assessment and Plan: Destiny Moore is a 82 y.o. female presenting with syncope . PMH is significant for HTN, HLD, depression, Stage 3 CKD, OA.  Syncope: With accompanying L-sided weakness, L-sided headache, and blurred vision, concern for TIA. CT head with mild atrophy and small vessel disease but no acute infarct, mass, or hemorrhage. EKG NSR. Normotensive upon presentation. No hypoglycemia (CBG 100 in ED). No abnormalities on neuro exam. MRI brain W/WO contrast showed no acute intracranial abnormality. Moderate atrophy with very mild chronic microvascular ischemia.TSH 1.5 (09/21/2011) A1c 5.9 (10/03/2011) Lipid chol 135 TG 117 HDL 57 LDL 55 - Cardiology consult for possible holter monitoring vs loop recorder, appreciate recs - Risk stratification labs - a1c pending - Telemetry - Neuro checks q2  Left sided weakness: Present for a few months. LLE slightly weaker than RLE (4/5 compared to 5/5 respectively) on physical exam, however patient able to ambulate without assistance with no gait abnormalities. Possibly 2/2 CVA, although patient also with osteoarthritis that could be causing decreased strength 2/2 pain. Weakness not limiting patient's daily activities. Does not recall any recent.  - Neuro checks q2 - PT/OT eval - recommended home and home health  HTN: Normotensive since admission. This am 163/88repeat 147/79 - Continue home amlodipine, HCTZ - monitor BP  HLD  Lipid chol 135 TG 117 HDL 57 LDL 55 - Continue home simvastatin  Memory loss: Chronic.  - Continue home Aricept   FEN/GI: heart healthy diet, Protonix,  NS@75cc /hr Prophylaxis: Lovenox  Disposition: pending medical management, home with home health  Subjective:  States feels well today and has no complaints. Is asking to go home today. Husband is at bedside and is concerned about falls at home.  Objective: Temp:  [97.9 F (36.6 C)-98.6 F (37 C)] 98.2 F (36.8 C) (07/06 0600) Pulse Rate:  [58-86] 86 (07/06 0600) Resp:  [13-25] 16 (07/06 0600) BP: (116-163)/(58-89) 163/88 mmHg (07/06 0600) SpO2:  [96 %-100 %] 99 % (07/06 0600) Weight:  [131 lb 9.6 oz (59.693 kg)-137 lb (62.143 kg)] 131 lb 9.6 oz (59.693 kg) (07/05 1720)   Physical Exam: General: well-appearing elderly female sitting up in bed in NAD HEENT: PERRLA, no scleral icterus, EOMI Cardiovascular: RRR, no murmurs appreciated, no LE edema, +DP pulses bilaterally Respiratory: CTAB, normal WOB on RA, no wheezes or rhonchi Abdomen: soft, non-tender, non-distended, +BS Extremities: moving limbs spontaneously Neuro: CN II-XII grossly intact, no focal deficits, A&Ox3; able to sit up and walk unassisted with no gait abnormalities 4/5 strength LLE, 5/5 strength RLE; 5/5 strength upper and lower extremities   Laboratory:  Recent Labs Lab 11/04/15 0957  WBC 5.6  HGB 12.6  HCT 38.9  PLT 231    Recent Labs Lab 11/04/15 0957  NA 140  K 3.1*  CL 108  CO2 26  BUN 17  CREATININE 1.37*  CALCIUM 9.1  GLUCOSE 93   Troponin 0.01  Lipid Panel     Component Value Date/Time   CHOL 135 11/05/2015 0523   TRIG 117 11/05/2015 0523   HDL 57 11/05/2015 0523   CHOLHDL 2.4 11/05/2015 0523   VLDL 23 11/05/2015 0523   LDLCALC 55 11/05/2015 0523   LDLDIRECT  85 08/01/2014 1424     Imaging/Diagnostic Tests: Dg Chest 2 View  11/04/2015  CLINICAL DATA:  Syncope yesterday.  Shortness of breath. EXAM: CHEST  2 VIEW COMPARISON:  04/06/2015 fell FINDINGS: The heart size and mediastinal contours are within normal limits. Both lungs are clear. The visualized skeletal structures are  unremarkable. IMPRESSION: No active cardiopulmonary disease. Electronically Signed   By: Kennith Center M.D.   On: 11/04/2015 11:59   Ct Head Wo Contrast  11/04/2015  CLINICAL DATA:  Syncope with fall EXAM: CT HEAD WITHOUT CONTRAST CT CERVICAL SPINE WITHOUT CONTRAST TECHNIQUE: Multidetector CT imaging of the head and cervical spine was performed following the standard protocol without intravenous contrast. Multiplanar CT image reconstructions of the cervical spine were also generated. COMPARISON:  Head CT Sep 17, 2015 FINDINGS: CT HEAD FINDINGS Mild diffuse atrophy is stable. There is no intracranial mass, hemorrhage, extra-axial fluid collection, or midline shift. There is mild small vessel disease in the centra semiovale bilaterally. Elsewhere gray-white compartments appear normal. No acute infarct evident. There is calcification in both cavernous carotid artery regions. The bony calvarium appears intact. The mastoid air cells are clear. There is opacification in portions of the sphenoid sinuses bilaterally as well as in several ethmoid air cells. No intraorbital lesions are appreciable. CT CERVICAL SPINE FINDINGS There is no fracture or spondylolisthesis. Prevertebral soft tissues and predental space regions are normal. There is moderately severe disc space narrowing at C4-5 and C5-6. There is slightly milder narrowing at C3-4. There is facet hypertrophy at multiple levels bilaterally. There is exit foraminal narrowing due to bony hypertrophy on the left at C3-4, on the left at C4-5, and on the left at C5-6. There is a dominant mass arising from the right lobe of the thyroid measuring 2.6 x 2.3 cm. This mass is of low attenuation and may represent a large colloid cyst. There is inhomogeneity in the attenuation of the left lobe of the thyroid. There are foci of atherosclerotic calcification in the carotid arteries bilaterally. IMPRESSION: CT head: Mild diffuse atrophy with mild periventricular small vessel  disease. No intracranial mass, hemorrhage, or extra-axial fluid collection. No acute infarct. Areas of paranasal sinus disease. Cavernous carotid artery calcification bilaterally. CT cervical spine: No demonstrable fracture or spondylolisthesis. Multilevel arthropathy. There is bilateral carotid artery calcification. 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 Given the size of this thyroid mass, correlation with ultrasound advised. There is also inhomogeneity in the left lobe of the thyroid. Electronically Signed   By: Bretta Bang III M.D.   On: 11/04/2015 12:16   Ct Cervical Spine Wo Contrast  11/04/2015  CLINICAL DATA:  Syncope with fall EXAM: CT HEAD WITHOUT CONTRAST CT CERVICAL SPINE WITHOUT CONTRAST TECHNIQUE: Multidetector CT imaging of the head and cervical spine was performed following the standard protocol without intravenous contrast. Multiplanar CT image reconstructions of the cervical spine were also generated. COMPARISON:  Head CT Sep 17, 2015 FINDINGS: CT HEAD FINDINGS Mild diffuse atrophy is stable. There is no intracranial mass, hemorrhage, extra-axial fluid collection, or midline shift. There is mild small vessel disease in the centra semiovale bilaterally. Elsewhere gray-white compartments appear normal. No acute infarct evident. There is calcification in both cavernous carotid artery regions. The bony calvarium appears intact. The mastoid air cells are clear. There is opacification in portions of the sphenoid sinuses bilaterally as well as in several ethmoid air cells. No intraorbital lesions are appreciable. CT CERVICAL SPINE FINDINGS There is no fracture or spondylolisthesis. Prevertebral soft  tissues and predental space regions are normal. There is moderately severe disc space narrowing at C4-5 and C5-6. There is slightly milder narrowing at C3-4. There is facet hypertrophy at multiple levels bilaterally. There is exit foraminal narrowing due to bony hypertrophy on the left at  C3-4, on the left at C4-5, and on the left at C5-6. There is a dominant mass arising from the right lobe of the thyroid measuring 2.6 x 2.3 cm. This mass is of low attenuation and may represent a large colloid cyst. There is inhomogeneity in the attenuation of the left lobe of the thyroid. There are foci of atherosclerotic calcification in the carotid arteries bilaterally. IMPRESSION: CT head: Mild diffuse atrophy with mild periventricular small vessel disease. No intracranial mass, hemorrhage, or extra-axial fluid collection. No acute infarct. Areas of paranasal sinus disease. Cavernous carotid artery calcification bilaterally. CT cervical spine: No demonstrable fracture or spondylolisthesis. Multilevel arthropathy. There is bilateral carotid artery calcification. 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 Given the size of this thyroid mass, correlation with ultrasound advised. There is also inhomogeneity in the left lobe of the thyroid. Electronically Signed   By: Bretta Bang III M.D.   On: 11/04/2015 12:16   Mr Brain Wo Contrast  11/04/2015  CLINICAL DATA:  Syncope. EXAM: MRI HEAD WITHOUT CONTRAST MRA HEAD WITHOUT CONTRAST TECHNIQUE: Multiplanar, multiecho pulse sequences of the brain and surrounding structures were obtained without intravenous contrast. Angiographic images of the head were obtained using MRA technique without contrast. COMPARISON:  CT head 11/04/2015 FINDINGS: MRI HEAD FINDINGS Moderate atrophy. Negative for acute infarct. Scattered small white matter hyperintensities compatible with mild chronic microvascular ischemia. Negative for mass lesion Chronic micro hemorrhage left posterior temporal lobe. No other areas of hemorrhage identified. No shift of the midline structures. Paranasal sinuses show mild mucosal edema. No orbital mass. Bilateral cataract extraction. Calvarium intact.  Pituitary normal in size. MRA HEAD FINDINGS Both vertebral arteries patent to the basilar. PICA  patent bilaterally. Basilar is tortuous but patent. Right AICA patent. Superior cerebellar and posterior cerebral arteries patent bilaterally. Posterior communicating artery is patent bilaterally. Cavernous carotid is patent bilaterally. Anterior and middle cerebral arteries are patent bilaterally without significant stenosis Negative for cerebral aneurysm. IMPRESSION: No acute intracranial abnormality. Moderate atrophy with very mild chronic microvascular ischemia. Negative MRA circle of Willis. Electronically Signed   By: Marlan Palau M.D.   On: 11/04/2015 21:09   Mr Maxine Glenn Head/brain Wo Cm  11/04/2015  CLINICAL DATA:  Syncope. EXAM: MRI HEAD WITHOUT CONTRAST MRA HEAD WITHOUT CONTRAST TECHNIQUE: Multiplanar, multiecho pulse sequences of the brain and surrounding structures were obtained without intravenous contrast. Angiographic images of the head were obtained using MRA technique without contrast. COMPARISON:  CT head 11/04/2015 FINDINGS: MRI HEAD FINDINGS Moderate atrophy. Negative for acute infarct. Scattered small white matter hyperintensities compatible with mild chronic microvascular ischemia. Negative for mass lesion Chronic micro hemorrhage left posterior temporal lobe. No other areas of hemorrhage identified. No shift of the midline structures. Paranasal sinuses show mild mucosal edema. No orbital mass. Bilateral cataract extraction. Calvarium intact.  Pituitary normal in size. MRA HEAD FINDINGS Both vertebral arteries patent to the basilar. PICA patent bilaterally. Basilar is tortuous but patent. Right AICA patent. Superior cerebellar and posterior cerebral arteries patent bilaterally. Posterior communicating artery is patent bilaterally. Cavernous carotid is patent bilaterally. Anterior and middle cerebral arteries are patent bilaterally without significant stenosis Negative for cerebral aneurysm. IMPRESSION: No acute intracranial abnormality. Moderate atrophy with very mild chronic microvascular  ischemia. Negative MRA circle of Willis. Electronically Signed   By: Marlan Palauharles  Clark M.D.   On: 11/04/2015 21:09   Echo:  Study Conclusions  - Left ventricle: The cavity size was normal. Wall thickness was  increased in a pattern of mild LVH. Systolic function was normal.  The estimated ejection fraction was in the range of 55% to 60%.  Wall motion was normal; there were no regional wall motion  abnormalities. Doppler parameters are consistent with abnormal  left ventricular relaxation (grade 1 diastolic dysfunction). - Aortic valve: Calcified non coronary cusp. - Atrial septum: No defect or patent foramen ovale was identified.  Transthoracic echocardiography. M-mode, complete 2D, spectral Doppler, and color Doppler. Birthdate: Patient birthdate: Nov 19, 1932. Age: Patient is 82 yr old. Sex: Gender: female. BMI: 24.9 kg/m^2. Blood pressure: 163/88 Patient status: Inpatient. Study date: Study date: 11/05/2015. Study time: 08:12 AM. Location: Echo laboratory.  Leland HerElsia J Ovella Manygoats, DO 11/05/2015, 7:10 AM PGY-1, [place] [facility] FPTS Intern pager: [phone], text pages welcome

## 2015-11-05 NOTE — Consult Note (Signed)
Cardiology Consult    Patient ID: DESTENEE GUERRY MRN: [id number], DOB/AGE: [birth year]   Admit date: 11/04/2015 Date of Consult: 11/05/2015  Primary Physician: Tarri Abernethy, MD Reason for Consult: Syncope Primary Cardiologist: Dr. Elease Hashimoto Requesting Provider: Dr. Randolm Idol    History of Present Illness    Destiny Moore is a 82 y.o. female with past medical history of HTN, HLD, and Stage 3 CKD who presented to [facility] ED on 11/04/2015 for syncopal events.  On 11/03/2015, she was standing in the kitchen, when she began to feel hot and weak, sliding down to a chair, but did not hit her head. Her husband witnessed the event and says he is "99% sure she lost consciousness". Upon waking after a few minutes, she was disoriented and confused. This resolved after 30+ minutes.   The following morning she reported having left-sided weakness, a headache, and blurred vision, therefore she came to the ED for further evaluation.   The patient and her husband report she has experienced 6-7 episodes similar to this in the past year. She becomes weak and dizzy, then gradually sits in a chair or on the floor. He believes she has only lost consciousness twice. She has noticed an association between when the episodes occur and the temperature of her environment, for the past two have occurred in her kitchen which she says was warm due to the oven being used. Has not experienced any syncopal episodes when changing positions, for most occur after she has been standing for quite some time. She denies any chest discomfort, palpitations, orthopnea, PND, dyspnea on exertion, or lower extremity edema.   While admitted, her CBC shows no acute abnormalities. BMET with creatinine 1.37 (close to baseline), K+ 3.1. Initial troponin negative. Urine culture positive for Group B Strep. Lipid Panel with LDL of 55. EKG on admission showed NSR, HR 68, with TWI in AVR and V1. CXR with no active cardiopulmonary disease. CT  Head with mild diffuse atrophy but no acute infarct. Dominant right thyroid mass noted, likely a colloid cyst. MRI with no acute intracranial abnormality.     Orthostatic Vitals were checked in the ED and showed: Lying: Pulse: 63 BP: 127/66 Sitting: Pulse: 64 BP: 141/73 Standing: Pulse: 71 BP: 133/73  She was last seen by Dr. Elease Hashimoto in 03/2015 for follow-up of chest pain. She has a history of noncardiac, atypical chest pain for many years. Her last ischemic evaluation was in 03/2015 which was a NST and showed no evidence of ischemia and was overall low-risk. EF estimated at 79%.   Past Medical History   Past Medical History  Diagnosis Date  . Hypertension   . Hyperlipidemia   . Anxiety   . UTI (urinary tract infection)   . Depression   . Osteoporosis   . Allergy   . CKD (chronic kidney disease)     stage 3 as of 04/2014.   Marland Kitchen Arthritis     Past Surgical History  Procedure Laterality Date  . Hystrectomy    . Bladder tact    . Abdominal hysterectomy    . Incontinence surgery    . Cholecystectomy N/A 05/08/2014    Procedure: LAPAROSCOPIC CHOLECYSTECTOMY WITH INTRAOPERATIVE CHOLANGIOGRAM;  Surgeon: Manus Rudd, MD;  Location: [facility] OR;  Service: General;  Laterality: N/A;     Allergies  Allergies  Allergen Reactions  . Carvedilol Anaphylaxis  . Coreg Anaphylaxis  . Lisinopril Anaphylaxis  . Sertraline Hcl Other (See Comments)    Could  not feed herself (no hand to mouth co-ordination)    Inpatient Medications    . amLODipine  10 mg Oral Daily  . aspirin EC  81 mg Oral Daily  . donepezil  5 mg Oral QPM  . enoxaparin (LOVENOX) injection  30 mg Subcutaneous Q24H  . gabapentin  100 mg Oral TID  . hydrochlorothiazide  12.5 mg Oral Daily  . pantoprazole  40 mg Oral Daily  . simvastatin  20 mg Oral Daily    Family History    Family History  Problem Relation Age of Onset  . Heart disease Mother   . Cancer Sister     breast  . Cancer Brother     skin, kidney, lung     Social History    Social History   Social History  . Marital Status: Married    Spouse Name: LAKEA MITTELMAN  . Number of Children: 2  . Years of Education: 10   Occupational History  . Retired- Emergency planning/management officer    Social History Main Topics  . Smoking status: Never Smoker   . Smokeless tobacco: Never Used  . Alcohol Use: No  . Drug Use: No  . Sexual Activity: Not on file   Other Topics Concern  . Not on file   Social History Narrative   Health Care POA:    Emergency Contact: Garnette Gunner [phone] (h)    End of Life Plan:    Who lives with you: Lives with husband,  step son Beulah Capobianco   Any pets: 4 cats   Diet: Patient has a varied diet of protein, starch, and vegetables.    Exercise: Patient does not have any regular exercise plan.  Does not like to exercise by her self.   Seatbelts: Patient reports wearing seat belt when in vehicle.   Sun Exposure/Protection: Patient does not wear sun protection.   Hobbies: cooking, traveling, visiting family in [place]                  Review of Systems    General:  No chills, fever, night sweats or weight changes.  Cardiovascular:  No chest pain, dyspnea on exertion, edema, orthopnea, palpitations, paroxysmal nocturnal dyspnea. Dermatological: No rash, lesions/masses Respiratory: No cough, dyspnea Urologic: No hematuria, dysuria Abdominal:   No nausea, vomiting, diarrhea, bright red blood per rectum, melena, or hematemesis Neurologic:  Positive for headaches, syncope, weakness, blurry vision, and changes in mental status. All other systems reviewed and are otherwise negative except as noted above.  Physical Exam    Blood pressure 129/76, pulse 73, temperature 98.4 F (36.9 C), temperature source Oral, resp. rate 16, height  (1.549 m), weight 131 lb 9.6 oz (59.693 kg), SpO2 98 %.  General: Pleasant, elderly Caucasian female appearing in NAD Psych: Normal affect. Neuro: Alert and oriented X 3. Moves  all extremities spontaneously. HEENT: Normal  Neck: Supple without bruits or JVD. Lungs:  Resp regular and unlabored, CTA without wheezing or rales. Heart: RRR no s3, s4, or murmurs. Abdomen: Soft, non-tender, non-distended, BS + x 4.  Extremities: No clubbing, cyanosis or edema. DP/PT/Radials 2+ and equal bilaterally.  Labs    Troponin (Point of Care Test)  Recent Labs  11/04/15 1125  TROPIPOC 0.01   No results for input(s): CKTOTAL, CKMB, TROPONINI in the last 72 hours. Lab Results  Component Value Date   WBC 5.6 11/04/2015   HGB 12.6 11/04/2015   HCT 38.9 11/04/2015   MCV 92.8 11/04/2015  PLT 231 11/04/2015    Recent Labs Lab 11/04/15 0957  NA 140  K 3.1*  CL 108  CO2 26  BUN 17  CREATININE 1.37*  CALCIUM 9.1  GLUCOSE 93   Lab Results  Component Value Date   CHOL 135 11/05/2015   HDL 57 11/05/2015   LDLCALC 55 11/05/2015   TRIG 117 11/05/2015    Radiology Studies    Dg Chest 2 View: 11/04/2015  CLINICAL DATA:  Syncope yesterday.  Shortness of breath. EXAM: CHEST  2 VIEW COMPARISON:  04/06/2015 fell FINDINGS: The heart size and mediastinal contours are within normal limits. Both lungs are clear. The visualized skeletal structures are unremarkable. IMPRESSION: No active cardiopulmonary disease. Electronically Signed   By: Kennith CenterEric  Mansell M.D.   On: 11/04/2015 11:59   Ct Cervical Spine Wo Contrast: 11/04/2015  CLINICAL DATA:  Syncope with fall EXAM: CT HEAD WITHOUT CONTRAST CT CERVICAL SPINE WITHOUT CONTRAST TECHNIQUE: Multidetector CT imaging of the head and cervical spine was performed following the standard protocol without intravenous contrast. Multiplanar CT image reconstructions of the cervical spine were also generated. COMPARISON:  Head CT Sep 17, 2015 FINDINGS: CT HEAD FINDINGS Mild diffuse atrophy is stable. There is no intracranial mass, hemorrhage, extra-axial fluid collection, or midline shift. There is mild small vessel disease in the centra semiovale  bilaterally. Elsewhere gray-white compartments appear normal. No acute infarct evident. There is calcification in both cavernous carotid artery regions. The bony calvarium appears intact. The mastoid air cells are clear. There is opacification in portions of the sphenoid sinuses bilaterally as well as in several ethmoid air cells. No intraorbital lesions are appreciable. CT CERVICAL SPINE FINDINGS There is no fracture or spondylolisthesis. Prevertebral soft tissues and predental space regions are normal. There is moderately severe disc space narrowing at C4-5 and C5-6. There is slightly milder narrowing at C3-4. There is facet hypertrophy at multiple levels bilaterally. There is exit foraminal narrowing due to bony hypertrophy on the left at C3-4, on the left at C4-5, and on the left at C5-6. There is a dominant mass arising from the right lobe of the thyroid measuring 2.6 x 2.3 cm. This mass is of low attenuation and may represent a large colloid cyst. There is inhomogeneity in the attenuation of the left lobe of the thyroid. There are foci of atherosclerotic calcification in the carotid arteries bilaterally. IMPRESSION: CT head: Mild diffuse atrophy with mild periventricular small vessel disease. No intracranial mass, hemorrhage, or extra-axial fluid collection. No acute infarct. Areas of paranasal sinus disease. Cavernous carotid artery calcification bilaterally. CT cervical spine: No demonstrable fracture or spondylolisthesis. Multilevel arthropathy. There is bilateral carotid artery calcification. 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 Given the size of this thyroid mass, correlation with ultrasound advised. There is also inhomogeneity in the left lobe of the thyroid. Electronically Signed   By: Bretta BangWilliam  Woodruff III M.D.   On: 11/04/2015 12:16   Mr Maxine GlennMra Head/brain ZOWo Cm: 11/04/2015  CLINICAL DATA:  Syncope. EXAM: MRI HEAD WITHOUT CONTRAST MRA HEAD WITHOUT CONTRAST TECHNIQUE: Multiplanar, multiecho  pulse sequences of the brain and surrounding structures were obtained without intravenous contrast. Angiographic images of the head were obtained using MRA technique without contrast. COMPARISON:  CT head 11/04/2015 FINDINGS: MRI HEAD FINDINGS Moderate atrophy. Negative for acute infarct. Scattered small white matter hyperintensities compatible with mild chronic microvascular ischemia. Negative for mass lesion Chronic micro hemorrhage left posterior temporal lobe. No other areas of hemorrhage identified. No shift of the midline  structures. Paranasal sinuses show mild mucosal edema. No orbital mass. Bilateral cataract extraction. Calvarium intact.  Pituitary normal in size. MRA HEAD FINDINGS Both vertebral arteries patent to the basilar. PICA patent bilaterally. Basilar is tortuous but patent. Right AICA patent. Superior cerebellar and posterior cerebral arteries patent bilaterally. Posterior communicating artery is patent bilaterally. Cavernous carotid is patent bilaterally. Anterior and middle cerebral arteries are patent bilaterally without significant stenosis Negative for cerebral aneurysm. IMPRESSION: No acute intracranial abnormality. Moderate atrophy with very mild chronic microvascular ischemia. Negative MRA circle of Willis. Electronically Signed   By: Marlan Palauharles  Clark M.D.   On: 11/04/2015 21:09    EKG & Cardiac Imaging    EKG: NSR, HR 68, with TWI in AVR and V1  Echocardiogram: 10/2015 Study Conclusions  - Left ventricle: The cavity size was normal. Wall thickness was  increased in a pattern of mild LVH. Systolic function was normal.  The estimated ejection fraction was in the range of 55% to 60%.  Wall motion was normal; there were no regional wall motion  abnormalities. Doppler parameters are consistent with abnormal  left ventricular relaxation (grade 1 diastolic dysfunction). - Aortic valve: Calcified non coronary cusp. - Atrial septum: No defect or patent foramen ovale was  identified.  Assessment & Plan   1. Syncope - reports multiple episodes of feeling weak and dizzy, with most occurring when she is in a warm environment. Most recent occurred when she was in the kitchen with the oven on and reports the room was hot. Did not hit her head. Was disoriented and confused initially upon waking after a few minutes. She then developed left-sided weakness, a headache, and blurred vision the following morning. No associated chest discomfort, dyspnea or palpitations.  - workup for CVA has been negative. Carotid dopplers with 1-39% stenosis bilaterally by prelim reports. Echo with preserved EF of 55-60%. Aortic sclerosis without stenosis noted. Orthostatic vitals negative on admission. EKG with NSR, HR 68, with TWI in AVR and V1, no acute changes since previous tracings.  - overall, her symptoms seem most consistent with a vasovagal etiology but with her repeat episodes and negative CTA workup thus far, will order a 30-day cardiac event monitor. Continue to monitor on telemetry while admitted (no pathologic rhythm noted on telemetry).     2. HLD - LDL at 55 this admission - continue statin therapy.  3. Right Thyroid Mass - noted on CT this admission. - per admitting team.  Signed, Ellsworth LennoxBrittany M Strader, PA-C 11/05/2015, 12:25 PM Pager: [phone]  I have personally seen and examined this patient with Randall AnBrittany Strader, PA-C. I agree with the assessment and plan as outlined above. Ms. Tiburcio PeaHarris is admitted following a syncopal event. TIA cannot be excluded but no evidence of CVA on Brain MRI. No arrhythmias noted on telemetry thus far. Echo with normal LV function. No significant valve disease. EKG without ischemic changes. Minimal carotid artery disease. She does not recall any recent illnesses that would have lead to dehydration. Exam is overall normal with a thin, elderly female, RRR without loud murmurs, clear lungs, no LE edema. Labs reviewed. EKG reviewed by me.  I suspect  that her event was vasovagal syncope. She should make sure she takes in an adequate amount of water each day. We will arrange a 30 day event monitor and follow up with Dr. Elease HashimotoNahser in our office. She could be discharged home from a cardiac standpoint.   Verne CarrowChristopher Cuma Polyakov 11/05/2015 1:31 PM

## 2015-11-05 NOTE — Evaluation (Signed)
Occupational Therapy Evaluation Patient Details Name: Destiny Moore MRN: [id number] DOB: May 11, 1932 Today's Date: 11/05/2015    History of Present Illness Patient is a 82 y/o female with hx of HTN, HLD, anxiety, depression, CKD presents with syncope with associated left sided weakness, headache and blurry vision. Brain MRI and CT-unremarkable. Workup pending.   Clinical Impression   Pt reports she required occasional assist from her husband with ADLs PTA. Currently pt overall min guard for ADLs and functional mobility. Pt tearful throughout session; stating she just wants to go home. Pt presenting with generalized weakness and and decreased attention to tasks impacting her independence and safety with ADLs and functional mobility. Pt planning to d/c home with 24/7 supervision from family. Recommending HHOT for follow up to maximize independence and safety with ADLs and functional mobility upon d/c home. Pt would benefit from continued skilled OT to address established goals.    Follow Up Recommendations  Home health OT;Supervision/Assistance - 24 hour    Equipment Recommendations  None recommended by OT    Recommendations for Other Services       Precautions / Restrictions Precautions Precautions: Fall Restrictions Weight Bearing Restrictions: No      Mobility Bed Mobility Overal bed mobility: Needs Assistance Bed Mobility: Supine to Sit     Supine to sit: Modified independent (Device/Increase time)     General bed mobility comments: HOB slightly elevated.  Transfers Overall transfer level: Needs assistance Equipment used: Rolling walker (2 wheeled) Transfers: Sit to/from Stand Sit to Stand: Min guard         General transfer comment: Min guard for safety, no physical assist required. Good hand placement.    Balance Overall balance assessment: Needs assistance;History of Falls Sitting-balance support: Feet supported;No upper extremity supported Sitting  balance-Leahy Scale: Good     Standing balance support: No upper extremity supported;During functional activity Standing balance-Leahy Scale: Fair Standing balance comment: Able to stand at sink and complete grooming activities with min guard assist.                             ADL Overall ADL's : Needs assistance/impaired Eating/Feeding: Set up;Sitting   Grooming: Min guard;Standing;Oral care;Applying deodorant;Wash/dry face;Wash/dry hands;Brushing hair   Upper Body Bathing: Set up;Supervision/ safety;Sitting   Lower Body Bathing: Min guard;Sit to/from stand   Upper Body Dressing : Set up;Supervision/safety;Sitting   Lower Body Dressing: Min guard;Sit to/from stand   Toilet Transfer: Min guard;Ambulation;Regular Toilet;RW   Toileting- ArchitectClothing Manipulation and Hygiene: Min guard;Sit to/from stand       Functional mobility during ADLs: Min guard;Rolling walker General ADL Comments: Pt tearful at times throughout session; reports she is ready to go home and has not slept a lot since she has been here. Husband is very supportive. Pt agreeable to HHOT.      Vision Additional Comments: Appears WFL   Perception     Praxis      Pertinent Vitals/Pain Pain Assessment: No/denies pain     Hand Dominance Right   Extremity/Trunk Assessment Upper Extremity Assessment Upper Extremity Assessment: Generalized weakness   Lower Extremity Assessment Lower Extremity Assessment: Defer to PT evaluation   Cervical / Trunk Assessment Cervical / Trunk Assessment: Kyphotic   Communication Communication Communication: No difficulties   Cognition Arousal/Alertness: Awake/alert Behavior During Therapy: WFL for tasks assessed/performed Overall Cognitive Status: Impaired/Different from baseline Area of Impairment: Safety/judgement;Problem solving;Memory     Memory: Decreased short-term memory   Safety/Judgement:  Decreased awareness of safety;Decreased awareness of  deficits   Problem Solving: Slow processing     General Comments       Exercises       Shoulder Instructions      Home Living Family/patient expects to be discharged to:: Private residence Living Arrangements: Spouse/significant other Available Help at Discharge: Family;Available 24 hours/day Type of Home: House Home Access: Stairs to enter Entergy CorporationEntrance Stairs-Number of Steps: 1 Entrance Stairs-Rails: None Home Layout: One level     Bathroom Shower/Tub: Tub/shower unit Shower/tub characteristics: Curtain FirefighterBathroom Toilet: Handicapped height     Home Equipment: Environmental consultantWalker - 4 wheels;Shower seat;Cane - single point;Grab bars - tub/shower;Grab bars - toilet          Prior Functioning/Environment Level of Independence: Needs assistance  Gait / Transfers Assistance Needed: Uses RW vs SPC when feeling weak. reports more frequent falls in the last few months- per spouse. Does not drive.  ADL's / Homemaking Assistance Needed: Assist to transfer into bathtub for shower but sometimes does it without assist per husband.        OT Diagnosis: Generalized weakness;Cognitive deficits;Altered mental status   OT Problem List: Decreased strength;Decreased activity tolerance;Impaired balance (sitting and/or standing);Decreased cognition;Decreased safety awareness   OT Treatment/Interventions: Self-care/ADL training;Therapeutic exercise;Energy conservation;DME and/or AE instruction;Therapeutic activities;Patient/family education;Balance training;Cognitive remediation/compensation    OT Goals(Current goals can be found in the care plan section) Acute Rehab OT Goals Patient Stated Goal: to go home today OT Goal Formulation: With patient/family Time For Goal Achievement: 11/19/15 Potential to Achieve Goals: Good ADL Goals Pt Will Perform Grooming: with modified independence;standing Pt Will Perform Upper Body Bathing: with modified independence;sitting Pt Will Perform Lower Body Bathing: with  supervision;sit to/from stand Pt Will Transfer to Toilet: with modified independence;ambulating;regular height toilet Pt Will Perform Toileting - Clothing Manipulation and hygiene: with modified independence;sit to/from stand Pt Will Perform Tub/Shower Transfer: with supervision;ambulating;shower seat;rolling walker Pt/caregiver will Perform Home Exercise Program: Increased strength;Both right and left upper extremity;With theraband;With written HEP provided;Independently  OT Frequency: Min 2X/week   Barriers to D/C:            Co-evaluation              End of Session Equipment Utilized During Treatment: Gait belt;Rolling walker  Activity Tolerance: Patient tolerated treatment well Patient left: in chair;with call bell/phone within reach;with chair alarm set;with family/visitor present   Time: [id number] OT Time Calculation (min): 39 min Charges:  OT General Charges $OT Visit: 1 Procedure OT Evaluation $OT Eval Moderate Complexity: 1 Procedure OT Treatments $Self Care/Home Management : 23-37 mins G-Codes: OT G-codes **NOT FOR INPATIENT CLASS** Functional Assessment Tool Used: Clinical judgement Functional Limitation: Self care Self Care Current Status ([id number]): At least 1 percent but less than 20 percent impaired, limited or restricted Self Care Goal Status (F6213(G8988): At least 1 percent but less than 20 percent impaired, limited or restricted   Gaye AlkenBailey A Geran Haithcock M.S., OTR/L Pager: [phone]  11/05/2015, 3:20 PM

## 2015-11-05 NOTE — Progress Notes (Signed)
OT Cancellation Note  Patient Details Name: Hardie Loravelyn L Marrocco MRN: [id number] DOB: 1932/05/04   Cancelled Treatment:    Reason Eval/Treat Not Completed: Patient at procedure or test/ unavailable.  Gaye AlkenBailey A Tandrea Kommer M.S., OTR/L Pager: [phone]  11/05/2015, 9:39 AM

## 2015-11-05 NOTE — Progress Notes (Signed)
Pt discharging at this time with husband taking all personal belongings. IV discontinued, dry dressing applied. Discharge instructions provided with verbal understanding. No noted distress. Pt denies pain or discomfort.

## 2015-11-05 NOTE — Care Management Note (Signed)
Case Management Note  Patient Details  Name: Destiny Moore MRN: [id number] Date of Birth: 1932/10/21  Subjective/Objective:    Pt admitted with syncope. She is from home with her spouse.                Action/Plan: PT recommends HH PT. Awaiting OT eval. CM following for d/c needs.   Expected Discharge Date:                  Expected Discharge Plan:  Home w Home Health Services  In-House Referral:     Discharge planning Services  CM Consult  Post Acute Care Choice:    Choice offered to:     DME Arranged:    DME Agency:     HH Arranged:    HH Agency:     Status of Service:  In process, will continue to follow  If discussed at Long Length of Stay Meetings, dates discussed:    Additional Comments:  Kermit BaloKelli F Jaxsen Bernhart, RN 11/05/2015, 1:48 PM

## 2015-11-05 NOTE — Progress Notes (Signed)
Pt ambulates to the bathroom stand by assist with rolling walker. Gait steady. No noted distress. Pt denies pain or discomfort. Will continue to monitor.

## 2015-11-05 NOTE — Progress Notes (Signed)
VASCULAR LAB PRELIMINARY  PRELIMINARY  PRELIMINARY  PRELIMINARY  Carotid duplex has been completed.    Preliminary report:  Bilateral:  1-39% ICA stenosis.  Vertebral artery flow is antegrade.      Rylen Hou, RVT, RDMS 11/05/2015, 11:20 AM

## 2015-11-05 NOTE — Evaluation (Signed)
Physical Therapy Evaluation Patient Details Name: Destiny Moore MRN: [id number] DOB: 17-Aug-1932 Today's Date: 11/05/2015   History of Present Illness  Patient is a 82 y/o female with hx of HTN, HLD, anxiety, depression, CKD presents with syncope with associated left sided weakness, headache and blurry vision. Brain MRI and CT-unremarkable. Workup pending.  Clinical Impression  Patient presents with mild left sided weakness, balance deficits and impaired safety awareness s/p above impacting mobility. Tolerated gait training with Min A for balance/safety.  Cadence and balance improved with use of SPC for stability however RW would provide greater support. Pt has 24/7 supervision from spouse at home and already getting HHPT. Will follow acutely to maximize independence and mobility prior to return home.     Follow Up Recommendations Home health PT;Supervision for mobility/OOB (continue HHPT)    Equipment Recommendations  None recommended by PT    Recommendations for Other Services       Precautions / Restrictions Precautions Precautions: Fall Restrictions Weight Bearing Restrictions: No      Mobility  Bed Mobility Overal bed mobility: Needs Assistance Bed Mobility: Supine to Sit     Supine to sit: Modified independent (Device/Increase time)     General bed mobility comments: HOB flat, no use of rails to simulate home environment.   Transfers Overall transfer level: Needs assistance Equipment used: None Transfers: Sit to/from Stand Sit to Stand: Min guard         General transfer comment: Min guard for safety. Stood from [employer]OB x1, transferred to chair post ambulation bout.  Ambulation/Gait Ambulation/Gait assistance: Min assist Ambulation Distance (Feet): 200 Feet Assistive device: Straight cane Gait Pattern/deviations: Step-through pattern;Decreased stride length;Staggering left;Trunk flexed;Narrow base of support Gait velocity: decreased Gait velocity  interpretation: <1.8 ft/sec, indicative of risk for recurrent falls General Gait Details: Slow, unsteady gait with veering left noted with 2 LOB requiring assist for support; reaching for rail for support at times.  Tried cane and cadence and balance improved.   Stairs Stairs: Yes Stairs assistance: Min guard Stair Management: Alternating pattern;Two rails Number of Stairs: 3 (+ 2 steps x2 bouts) General stair comments: Despite cues for technique, pt performing alternating pattern and ascending with weaker leg. Difficulty following commands to perform safe technique.  Wheelchair Mobility    Modified Rankin (Stroke Patients Only) Modified Rankin (Stroke Patients Only) Pre-Morbid Rankin Score: Moderate disability Modified Rankin: Moderately severe disability     Balance Overall balance assessment: Needs assistance;History of Falls Sitting-balance support: Feet supported;No upper extremity supported Sitting balance-Leahy Scale: Good Sitting balance - Comments: Able to reach outside BoS and adjust socks without difficulty.    Standing balance support: During functional activity Standing balance-Leahy Scale: Fair Standing balance comment: Performed within room ambulation without UE support but requires UE support for out of room ambulation for safety.                              Pertinent Vitals/Pain Pain Assessment: No/denies pain    Home Living Family/patient expects to be discharged to:: Private residence Living Arrangements: Spouse/significant other Available Help at Discharge: Family;Available 24 hours/day Type of Home: House Home Access: Stairs to enter Entrance Stairs-Rails: None (awnings to hold onto) Entrance Stairs-Number of Steps: 1 Home Layout: One level Home Equipment: Walker - 4 wheels;Shower seat;Cane - single point;Grab bars - tub/shower;Grab bars - toilet      Prior Function Level of Independence: Needs assistance   Gait / Transfers Assistance  Needed: Uses RW vs SPC when feeling weak. reports more frequent falls in the last few months- per spouse. Does not drive.   ADL's / Homemaking Assistance Needed: Assist to transfer into bathtub for shower but sometimes does it without assist per husband.  Comments: Pt getting HHPT     Hand Dominance   Dominant Hand: Right    Extremity/Trunk Assessment   Upper Extremity Assessment: Defer to OT evaluation           Lower Extremity Assessment: LLE deficits/detail;Difficult to assess due to impaired cognition   LLE Deficits / Details: Strength is functional but slightly weaker then right side.      Communication   Communication: No difficulties  Cognition Arousal/Alertness: Awake/alert Behavior During Therapy: WFL for tasks assessed/performed Overall Cognitive Status: Impaired/Different from baseline Area of Impairment: Safety/judgement;Problem solving;Memory     Memory: Decreased short-term memory   Safety/Judgement: Decreased awareness of safety;Decreased awareness of deficits   Problem Solving: Slow processing General Comments: Pt not a great historian but spouse present to help provide information as reported history from pt is not the same as per spouse.    General Comments General comments (skin integrity, edema, etc.): Spouse present during session.    Exercises        Assessment/Plan    PT Assessment Patient needs continued PT services  PT Diagnosis Generalized weakness;Difficulty walking   PT Problem List Decreased strength;Decreased cognition;Decreased activity tolerance;Decreased balance;Decreased mobility;Decreased safety awareness;Decreased knowledge of use of DME  PT Treatment Interventions Balance training;Gait training;Stair training;Functional mobility training;Therapeutic activities;Therapeutic exercise;Patient/family education   PT Goals (Current goals can be found in the Care Plan section) Acute Rehab PT Goals Patient Stated Goal: to go home  today PT Goal Formulation: With patient/family Time For Goal Achievement: 11/19/15 Potential to Achieve Goals: Good    Frequency Min 3X/week   Barriers to discharge        Co-evaluation               End of Session Equipment Utilized During Treatment: Gait belt Activity Tolerance: Patient tolerated treatment well Patient left: in chair;with call bell/phone within reach;with chair alarm set;with family/visitor present Nurse Communication: Mobility status    Functional Assessment Tool Used: clinical judgment Functional Limitation: Mobility: Walking and moving around Mobility: Walking and Moving Around Current Status [phone](G8978): At least 20 percent but less than 40 percent impaired, limited or restricted Mobility: Walking and Moving Around Goal Status [phone](G8979): At least 1 percent but less than 20 percent impaired, limited or restricted    Time: [id number] PT Time Calculation (min) (ACUTE ONLY): 25 min   Charges:   PT Evaluation $PT Eval Moderate Complexity: 1 Procedure PT Treatments $Gait Training: 8-22 mins   PT G Codes:   PT G-Codes **NOT FOR INPATIENT CLASS** Functional Assessment Tool Used: clinical judgment Functional Limitation: Mobility: Walking and moving around Mobility: Walking and Moving Around Current Status ([id number](G8978): At least 20 percent but less than 40 percent impaired, limited or restricted Mobility: Walking and Moving Around Goal Status [phone](G8979): At least 1 percent but less than 20 percent impaired, limited or restricted    Agripina Guyette A Raquell Richer 11/05/2015, 10:24 AM Mylo RedShauna Porshe Fleagle, PT, DPT [phone]

## 2015-11-06 LAB — HEMOGLOBIN A1C
HEMOGLOBIN A1C: 6 % — AB (ref 4.8–5.6)
Mean Plasma Glucose: 126 mg/dL

## 2015-11-06 NOTE — Discharge Summary (Signed)
Family Medicine Teaching [facility] Discharge Summary  Patient name: Destiny Moore Medical record number: [id number] Date of birth: 21-Nov-1932 Age: 82 y.o. Gender: female Date of Admission: 11/04/2015  Date of Discharge: 11/05/15 Admitting Physician: Uvaldo Rising, MD  Primary Care Provider: Tarri Abernethy, MD Consultants: Neurology  Indication for Hospitalization: Syncope  Discharge Diagnoses/Problem List:  Syncope HTN HLD Memory loss  Disposition: Home   Discharge Condition: Stable, improved  Discharge Exam:  General: well-appearing elderly female sitting up in bed in NAD HEENT: PERRLA, no scleral icterus, EOMI Cardiovascular: RRR, no murmurs appreciated, no LE edema, +DP pulses bilaterally Respiratory: CTAB, normal WOB on RA, no wheezes or rhonchi Abdomen: soft, non-tender, non-distended, +BS Extremities: moving limbs spontaneously Neuro: CN II-XII grossly intact, no focal deficits, A&Ox3; able to sit up and walk unassisted with no gait abnormalities 4/5 strength LLE, 5/5 strength RLE; 5/5 strength upper and lower extremities  Brief Hospital Course:  Destiny Moore is a 82 y.o. female presenting with syncope . PMH is significant for HTN, HLD, depression, Stage 3 CKD, OA.  Syncope:  Patient presented with 2 episodes of syncope with accompanying L-sided weakness, L-sided headache, and blurred vision concering for TIA. Her most recent episode was the day before presentation, in a hot kitchen and patient could recall being hot and weak. She was normotensive upon presentation.  Her CT head showed mild atrophy and small vessel disease but no acute infarct, mass, or hemorrhage; Marland Kitchen MRI brain W/WO contrast showed no acute intracranial abnormality, only moderate atrophy with very mild chronic microvascular ischemia.Carotid dopplers with 1-39% stenosis bilaterally by prelim reports. Echo with preserved EF of 55-60%. Aortic sclerosis without stenosis noted. Her risk  stratification labs included A1c 6.0, Lipid chol 135 TG 117 HDL 57 LDL 55  Given her negative neurologic workup, cardiology was consulted and patient was discharged on 30day cardiac event monitor. Patient to follow up with Dr. Elease Hashimoto.  Thyroid mass:  Found incidentally on CT cervical spine during neurological workup for syncope. 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 Given the size of this thyroid mass, correlation with ultrasound advised. There is also inhomogeneity in the left lobe of the thyroid.  The remainder of her chronic medical conditions were stable and no changes were made to her home medications.  Issues for Follow Up:  1. Patient was discharged with a 30day cardiac event monitor per cardiology recommendations. Please make sure she follows with up Dr. Elease Hashimoto 2. Please follow up on thyroid mass found incidentally on CT cervical spine, patient will need outpatient workup  Significant Procedures: none  Significant Labs and Imaging:   Recent Labs Lab 11/04/15 0957  WBC 5.6  HGB 12.6  HCT 38.9  PLT 231    Recent Labs Lab 11/04/15 0957  NA 140  K 3.1*  CL 108  CO2 26  GLUCOSE 93  BUN 17  CREATININE 1.37*  CALCIUM 9.1    Dg Chest 2 View  11/04/2015  CLINICAL DATA:  Syncope yesterday.  Shortness of breath. EXAM: CHEST  2 VIEW COMPARISON:  04/06/2015 fell FINDINGS: The heart size and mediastinal contours are within normal limits. Both lungs are clear. The visualized skeletal structures are unremarkable. IMPRESSION: No active cardiopulmonary disease. Electronically Signed   By: Kennith Center M.D.   On: 11/04/2015 11:59   Ct Head Wo Contrast  11/04/2015  CLINICAL DATA:  Syncope with fall EXAM: CT HEAD WITHOUT CONTRAST CT CERVICAL SPINE WITHOUT CONTRAST TECHNIQUE: Multidetector CT imaging  of the head and cervical spine was performed following the standard protocol without intravenous contrast. Multiplanar CT image reconstructions of the cervical spine  were also generated. COMPARISON:  Head CT Sep 17, 2015 FINDINGS: CT HEAD FINDINGS Mild diffuse atrophy is stable. There is no intracranial mass, hemorrhage, extra-axial fluid collection, or midline shift. There is mild small vessel disease in the centra semiovale bilaterally. Elsewhere gray-white compartments appear normal. No acute infarct evident. There is calcification in both cavernous carotid artery regions. The bony calvarium appears intact. The mastoid air cells are clear. There is opacification in portions of the sphenoid sinuses bilaterally as well as in several ethmoid air cells. No intraorbital lesions are appreciable. CT CERVICAL SPINE FINDINGS There is no fracture or spondylolisthesis. Prevertebral soft tissues and predental space regions are normal. There is moderately severe disc space narrowing at C4-5 and C5-6. There is slightly milder narrowing at C3-4. There is facet hypertrophy at multiple levels bilaterally. There is exit foraminal narrowing due to bony hypertrophy on the left at C3-4, on the left at C4-5, and on the left at C5-6. There is a dominant mass arising from the right lobe of the thyroid measuring 2.6 x 2.3 cm. This mass is of low attenuation and may represent a large colloid cyst. There is inhomogeneity in the attenuation of the left lobe of the thyroid. There are foci of atherosclerotic calcification in the carotid arteries bilaterally. IMPRESSION: CT head: Mild diffuse atrophy with mild periventricular small vessel disease. No intracranial mass, hemorrhage, or extra-axial fluid collection. No acute infarct. Areas of paranasal sinus disease. Cavernous carotid artery calcification bilaterally. CT cervical spine: No demonstrable fracture or spondylolisthesis. Multilevel arthropathy. There is bilateral carotid artery calcification. 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 Given the size of this thyroid mass, correlation with ultrasound advised. There is also inhomogeneity in  the left lobe of the thyroid. Electronically Signed   By: Bretta Bang III M.D.   On: 11/04/2015 12:16   Ct Cervical Spine Wo Contrast  11/04/2015  CLINICAL DATA:  Syncope with fall EXAM: CT HEAD WITHOUT CONTRAST CT CERVICAL SPINE WITHOUT CONTRAST TECHNIQUE: Multidetector CT imaging of the head and cervical spine was performed following the standard protocol without intravenous contrast. Multiplanar CT image reconstructions of the cervical spine were also generated. COMPARISON:  Head CT Sep 17, 2015 FINDINGS: CT HEAD FINDINGS Mild diffuse atrophy is stable. There is no intracranial mass, hemorrhage, extra-axial fluid collection, or midline shift. There is mild small vessel disease in the centra semiovale bilaterally. Elsewhere gray-white compartments appear normal. No acute infarct evident. There is calcification in both cavernous carotid artery regions. The bony calvarium appears intact. The mastoid air cells are clear. There is opacification in portions of the sphenoid sinuses bilaterally as well as in several ethmoid air cells. No intraorbital lesions are appreciable. CT CERVICAL SPINE FINDINGS There is no fracture or spondylolisthesis. Prevertebral soft tissues and predental space regions are normal. There is moderately severe disc space narrowing at C4-5 and C5-6. There is slightly milder narrowing at C3-4. There is facet hypertrophy at multiple levels bilaterally. There is exit foraminal narrowing due to bony hypertrophy on the left at C3-4, on the left at C4-5, and on the left at C5-6. There is a dominant mass arising from the right lobe of the thyroid measuring 2.6 x 2.3 cm. This mass is of low attenuation and may represent a large colloid cyst. There is inhomogeneity in the attenuation of the left lobe of the thyroid. There  are foci of atherosclerotic calcification in the carotid arteries bilaterally. IMPRESSION: CT head: Mild diffuse atrophy with mild periventricular small vessel disease. No  intracranial mass, hemorrhage, or extra-axial fluid collection. No acute infarct. Areas of paranasal sinus disease. Cavernous carotid artery calcification bilaterally. CT cervical spine: No demonstrable fracture or spondylolisthesis. Multilevel arthropathy. There is bilateral carotid artery calcification. 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 Given the size of this thyroid mass, correlation with ultrasound advised. There is also inhomogeneity in the left lobe of the thyroid. Electronically Signed   By: Bretta BangWilliam  Woodruff III M.D.   On: 11/04/2015 12:16   Mr Brain Wo Contrast  11/04/2015  CLINICAL DATA:  Syncope. EXAM: MRI HEAD WITHOUT CONTRAST MRA HEAD WITHOUT CONTRAST TECHNIQUE: Multiplanar, multiecho pulse sequences of the brain and surrounding structures were obtained without intravenous contrast. Angiographic images of the head were obtained using MRA technique without contrast. COMPARISON:  CT head 11/04/2015 FINDINGS: MRI HEAD FINDINGS Moderate atrophy. Negative for acute infarct. Scattered small white matter hyperintensities compatible with mild chronic microvascular ischemia. Negative for mass lesion Chronic micro hemorrhage left posterior temporal lobe. No other areas of hemorrhage identified. No shift of the midline structures. Paranasal sinuses show mild mucosal edema. No orbital mass. Bilateral cataract extraction. Calvarium intact.  Pituitary normal in size. MRA HEAD FINDINGS Both vertebral arteries patent to the basilar. PICA patent bilaterally. Basilar is tortuous but patent. Right AICA patent. Superior cerebellar and posterior cerebral arteries patent bilaterally. Posterior communicating artery is patent bilaterally. Cavernous carotid is patent bilaterally. Anterior and middle cerebral arteries are patent bilaterally without significant stenosis Negative for cerebral aneurysm. IMPRESSION: No acute intracranial abnormality. Moderate atrophy with very mild chronic microvascular ischemia.  Negative MRA circle of Willis. Electronically Signed   By: Marlan Palauharles  Clark M.D.   On: 11/04/2015 21:09   Mr Maxine GlennMra Head/brain Wo Cm  11/04/2015  CLINICAL DATA:  Syncope. EXAM: MRI HEAD WITHOUT CONTRAST MRA HEAD WITHOUT CONTRAST TECHNIQUE: Multiplanar, multiecho pulse sequences of the brain and surrounding structures were obtained without intravenous contrast. Angiographic images of the head were obtained using MRA technique without contrast. COMPARISON:  CT head 11/04/2015 FINDINGS: MRI HEAD FINDINGS Moderate atrophy. Negative for acute infarct. Scattered small white matter hyperintensities compatible with mild chronic microvascular ischemia. Negative for mass lesion Chronic micro hemorrhage left posterior temporal lobe. No other areas of hemorrhage identified. No shift of the midline structures. Paranasal sinuses show mild mucosal edema. No orbital mass. Bilateral cataract extraction. Calvarium intact.  Pituitary normal in size. MRA HEAD FINDINGS Both vertebral arteries patent to the basilar. PICA patent bilaterally. Basilar is tortuous but patent. Right AICA patent. Superior cerebellar and posterior cerebral arteries patent bilaterally. Posterior communicating artery is patent bilaterally. Cavernous carotid is patent bilaterally. Anterior and middle cerebral arteries are patent bilaterally without significant stenosis Negative for cerebral aneurysm. IMPRESSION: No acute intracranial abnormality. Moderate atrophy with very mild chronic microvascular ischemia. Negative MRA circle of Willis. Electronically Signed   By: Marlan Palauharles  Clark M.D.   On: 11/04/2015 21:09    Results/Tests Pending at Time of Discharge: none  Discharge Medications:    Medication List    TAKE these medications        acetaminophen 500 MG tablet  Commonly known as:  TYLENOL  Take 1 tablet (500 mg total) by mouth every 6 (six) hours as needed.     alendronate 70 MG tablet  Commonly known as:  FOSAMAX  Take 70 mg by mouth every Monday.  Take with a full  glass of water on an empty stomach.     amLODipine 10 MG tablet  Commonly known as:  NORVASC  Take 1 tablet by mouth daily     aspirin EC 81 MG tablet  Take 81 mg by mouth daily.     cholecalciferol 1000 units tablet  Commonly known as:  VITAMIN D  Take 1,000 Units by mouth daily.     CRANBERRY PO  Take 1 tablet by mouth daily.     donepezil 5 MG tablet  Commonly known as:  ARICEPT  Take 5 mg by mouth every evening.     EPINEPHrine 0.3 mg/0.3 mL Soaj injection  Commonly known as:  EPI-PEN  Inject 0.3 mLs (0.3 mg total) into the muscle once.     gabapentin 100 MG capsule  Commonly known as:  NEURONTIN  TAKE 1 CAPSULE BY MOUTH THREE TIMES DAILY     hydrochlorothiazide 12.5 MG capsule  Commonly known as:  MICROZIDE  Take 12.5 mg by mouth daily.     HYDROcodone-acetaminophen 5-325 MG tablet  Commonly known as:  NORCO/VICODIN  Take 1 tablet by mouth every 8 (eight) hours as needed for moderate pain.     loperamide 2 MG capsule  Commonly known as:  IMODIUM  take 1 capsule BY MOUTH AS NEEDED for diarrhea or loose stools     OVER THE COUNTER MEDICATION  Place 1 drop into both eyes 2 (two) times daily as needed (dry eyes/burning). Over the counter lubricating eye drops     pantoprazole 40 MG tablet  Commonly known as:  PROTONIX  Take 1 tablet by mouth daily     simvastatin 20 MG tablet  Commonly known as:  ZOCOR  Take 1 tablet by mouth daily     traMADol 50 MG tablet  Commonly known as:  ULTRAM  Take 1-2 tablets by mouth every 12 hours as needed for pain     VITAMIN B-12 PO  Take 1 tablet by mouth daily.     VITAMIN C PO  Take 1 tablet by mouth daily.        Discharge Instructions: Please refer to Patient Instructions section of EMR for full details.  Patient was counseled important signs and symptoms that should prompt return to medical care, changes in medications, dietary instructions, activity restrictions, and follow up appointments.    Follow-Up Appointments: Follow-up Information    Follow up with [facility] [address] [facility] On 11/09/2015.   Specialty:  Cardiology   Why:  Cardiology appointment to pick up event monitor on 7/10/201[facility]7 at 2:00PM.   Contact information:   [address], [address] [place] [id number] [phone]      Follow up with Rodrigo Ranrystal Dorsey, MD On 11/10/2015.   Specialty:  Family Medicine   Why:  3:30pm   Contact information:   [address] [place] [address] [phone]       Leland Herlsia J Chidera Dearcos, DO 11/07/2015, 10:48 PM PGY-1, [facility]

## 2015-11-09 ENCOUNTER — Ambulatory Visit (INDEPENDENT_AMBULATORY_CARE_PROVIDER_SITE_OTHER): Payer: Medicare Other

## 2015-11-09 DIAGNOSIS — R55 Syncope and collapse: Secondary | ICD-10-CM

## 2015-11-10 ENCOUNTER — Ambulatory Visit (INDEPENDENT_AMBULATORY_CARE_PROVIDER_SITE_OTHER): Payer: Medicare Other | Admitting: Family Medicine

## 2015-11-10 ENCOUNTER — Encounter: Payer: Self-pay | Admitting: Family Medicine

## 2015-11-10 VITALS — BP 150/70 | HR 75 | Temp 98.2°F | Ht 61.0 in | Wt 135.0 lb

## 2015-11-10 DIAGNOSIS — E042 Nontoxic multinodular goiter: Secondary | ICD-10-CM | POA: Diagnosis not present

## 2015-11-10 DIAGNOSIS — R55 Syncope and collapse: Secondary | ICD-10-CM | POA: Diagnosis not present

## 2015-11-10 NOTE — Patient Instructions (Signed)
You should follow up with Dr. Hal Hopeichter Follow up with Dr. Melburn PopperNasher for the heart monitor. You should have an ultrasound of your thyroid in the near future given the mass seen on CT scan. Fortunately, there was nothing scary like a stroke on MRI and CT of your brain.  Near-Syncope Near-syncope (commonly known as near fainting) is sudden weakness, dizziness, or feeling like you might pass out. During an episode of near-syncope, you may also develop pale skin, have tunnel vision, or feel sick to your stomach (nauseous). Near-syncope may occur when getting up after sitting or while standing for a long time. It is caused by a sudden decrease in blood flow to the brain. This decrease can result from various causes or triggers, most of which are not serious. However, because near-syncope can sometimes be a sign of something serious, a medical evaluation is required. The specific cause is often not determined. HOME CARE INSTRUCTIONS  Monitor your condition for any changes. The following actions may help to alleviate any discomfort you are experiencing:  Have someone stay with you until you feel stable.  Lie down right away and prop your feet up if you start feeling like you might faint. Breathe deeply and steadily. Wait until all the symptoms have passed. Most of these episodes last only a few minutes. You may feel tired for several hours.   Drink enough fluids to keep your urine clear or pale yellow.   If you are taking blood pressure or heart medicine, get up slowly when seated or lying down. Take several minutes to sit and then stand. This can reduce dizziness.  Follow up with your health care provider as directed. SEEK IMMEDIATE MEDICAL CARE IF:   You have a severe headache.   You have unusual pain in the chest, abdomen, or back.   You are bleeding from the mouth or rectum, or you have black or tarry stool.   You have an irregular or very fast heartbeat.   You have repeated fainting or  have seizure-like jerking during an episode.   You faint when sitting or lying down.   You have confusion.   You have difficulty walking.   You have severe weakness.   You have vision problems.  MAKE SURE YOU:   Understand these instructions.  Will watch your condition.  Will get help right away if you are not doing well or get worse.   This information is not intended to replace advice given to you by your health care provider. Make sure you discuss any questions you have with your health care provider.   Document Released: 04/18/2005 Document Revised: 04/23/2013 Document Reviewed: 09/21/2012 Elsevier Interactive Patient Education [employer]2016 Elsevier Inc.

## 2015-11-10 NOTE — Progress Notes (Signed)
Subjective: CC: Hospital follow up  HPI: Patient is a 82 y.o. female with a past medical history of HTN, HLD, depression, Stage 3 CKD, OA presenting to clinic today for a hospital follow up for syncope.   The patient presented with 2 episodes of syncope with accompanying L-sided weakness, L-sided headache, and blurred vision concering for TIA. Head CT and MRI were unremarkable for any acute changes. Carotid Dopplers revealed a 1-39% stenosis bilaterally. EF on echo was 55-60%. She was discharged home with a 30 day cardiac event monitor and will follow-up Dr. Melburn PopperNasher.  Since her hospitalization she has not had any falls, dizziness, fatigue, headache, chest pain, SOB, palpitations, blurred vision, or left sided weakness. She notes that she never truly had LOC leading up to her hospitalization- she was in the kitchen cooking her husband squash when she began to feel weak and like she was going to give out and lowered herself down to the ground.   Additionally, a thyroid mass was incidentally noted on CT; Most Likely Colloid Cyst. Radiology suggested f/u with ultrasound.  The patient did not recall hearing about this finding during the hospitalization and was appreciative of me discussing the findings. She denies cold or heat intolerance, dry skin, or palpitations.    Of note, the patient's PCP is noted to be Dr. Nadyne CoombesKaren Richter. The patient and her husband confirm this is correct. She has been coming to our clinic quite often over years and were unaware that we were also consider a primary care practice.   Social History: Non-smoker   ROS: All other systems reviewed and are negative.  Past Medical History Patient Active Problem List   Diagnosis Date Noted  . Left-sided weakness   . Syncope 11/04/2015  . Shingles 01/15/2015  . Acute cystitis 12/22/2014  . Bilateral lower extremity edema 12/22/2014  . Other chest pain 12/09/2014  . Multiple thyroid nodules 12/09/2014  . Urinary  incontinence 11/13/2014  . Degenerative arthritis of left shoulder region 11/13/2014  . Recurrent falls 11/06/2014    Class: Acute  . Epistaxis 08/01/2014  . S/P laparoscopic cholecystectomy 05/14/2014  . Memory loss 05/14/2014  . Common bile duct dilation   . Calculus of bile duct with obstruction and without cholangitis or cholecystitis   . Abdominal pain 05/05/2014  . Biliary stone 05/05/2014  . RUQ abdominal pain 05/05/2014  . GERD (gastroesophageal reflux disease) 04/17/2014  . Cholelithiasis 04/17/2014  . Overactive bladder 09/20/2013  . Bilateral lower extremity pain 08/21/2013  . Constipation 01/20/2011  . Abnormal gait 01/20/2011  . Depression 07/14/2010  . CKD (chronic kidney disease) stage 3, GFR 30-59 ml/min 06/04/2010  . UNSPECIFIED GENITAL PROLAPSE 05/27/2010  . Hyperlipidemia 06/17/2009  . PTSD 06/17/2009  . Essential hypertension 06/17/2009  . Osteoarthritis of multiple joints 06/17/2009  . Osteoporosis 06/17/2009    Medications- reviewed and updated Current Outpatient Prescriptions  Medication Sig Dispense Refill  . acetaminophen (TYLENOL) 500 MG tablet Take 1 tablet (500 mg total) by mouth every 6 (six) hours as needed. 30 tablet 0  . alendronate (FOSAMAX) 70 MG tablet Take 70 mg by mouth every Monday. Take with a full glass of water on an empty stomach.    Marland Kitchen. amLODipine (NORVASC) 10 MG tablet Take 1 tablet by mouth daily 90 tablet 2  . Ascorbic Acid (VITAMIN C PO) Take 1 tablet by mouth daily.    Marland Kitchen. aspirin EC 81 MG tablet Take 81 mg by mouth daily.    . cholecalciferol (VITAMIN D)  1000 UNITS tablet Take 1,000 Units by mouth daily.    Marland Kitchen CRANBERRY PO Take 1 tablet by mouth daily.    . Cyanocobalamin (VITAMIN B-12 PO) Take 1 tablet by mouth daily.    Marland Kitchen donepezil (ARICEPT) 5 MG tablet Take 5 mg by mouth every evening.  5  . EPINEPHrine 0.3 mg/0.3 mL IJ SOAJ injection Inject 0.3 mLs (0.3 mg total) into the muscle once. (Patient taking differently: Inject 0.3 mg  into the muscle once as needed (allergic reaction). ) 1 Device 1  . gabapentin (NEURONTIN) 100 MG capsule TAKE 1 CAPSULE BY MOUTH THREE TIMES DAILY (Patient taking differently: take  by mouth at bedtime) 90 capsule 3  . hydrochlorothiazide (MICROZIDE) 12.5 MG capsule Take 12.5 mg by mouth daily.    Marland Kitchen loperamide (IMODIUM) 2 MG capsule take 1 capsule BY MOUTH AS NEEDED for diarrhea or loose stools 30 capsule 0  . OVER THE COUNTER MEDICATION Place 1 drop into both eyes 2 (two) times daily as needed (dry eyes/burning). Over the counter lubricating eye drops    . pantoprazole (PROTONIX) 40 MG tablet Take 1 tablet by mouth daily 30 tablet 5  . simvastatin (ZOCOR) 20 MG tablet Take 1 tablet by mouth daily 90 tablet 1  . traMADol (ULTRAM) 50 MG tablet Take 1-2 tablets by mouth every 12 hours as needed for pain 30 tablet 0   No current facility-administered medications for this visit.    Objective: Office vital signs reviewed. BP 150/70 mmHg  Pulse 75  Temp(Src) 98.2 F (36.8 C) (Oral)  Ht  (1.549 m)  Wt 135 lb (61.236 kg)  BMI 25.52 kg/m2   Physical Examination:  General: Awake, alert, well- nourished, NAD Eyes: Conjunctiva non-injected. PERRL.  Cardio: RRR, no m/r/g noted.  Pulm: No increased WOB.  CTAB, without wheezes, rhonchi or crackles noted.  Extremities: No pitting edema.  Neuro: Speech clear. EOMI, Uvula and tongue midline. Facial movements symmetric. 5/5 strength in the upper extremities and lower extremities bilaterally. Sensation grossly intact bilaterally. Normal DTRs.   Assessment/Plan: Syncope Ms. Crawshaw is a 82 y/o presenting for a hospital follow up due to reported syncope, however after hearing her story it sounds as though she may have pre-syncope given she never had LOC.  There are no gross neurologic deficits noted on exam. TIA workup was unrevealing for an etiology. - continue with cardiac monitor  - patient should be referred back to Dr. Melburn Popper with  cardiology by her PCP (we discussed that she only needs to have 1 PCP in order to have good continuity of care).   Multiple thyroid nodules Patient noted to have a dominant mass arising from the right lobe of the thyroid measuring 2.6 x 2.3 cm. When compared to prior imaging it seems smaller. Unsure if this has been aspirated in the past (from previous notes, the patient reported this was done in 2008, however no procedures noted in our system).  I am curious what work up her PCP, Dr. Hal Hope has preformed. - consider thyroid ultrasound if not done recently - last TSH performed in our system was 2013; consider repeating if not done by PCP recently. - advised patient's husband follow up with her PCP, Dr. Hal Hope.    No orders of the defined types were placed in this encounter.    No orders of the defined types were placed in this encounter.    Joanna Puff PGY-2, [facility]

## 2015-11-11 ENCOUNTER — Encounter: Payer: Self-pay | Admitting: Family Medicine

## 2015-11-11 ENCOUNTER — Telehealth: Payer: Self-pay | Admitting: *Deleted

## 2015-11-11 NOTE — Assessment & Plan Note (Signed)
Ms. Destiny Moore is a 82 y/o presenting for a hospital follow up due to reported syncope, however after hearing her story it sounds as though she may have pre-syncope given she never had LOC.  There are no gross neurologic deficits noted on exam. TIA workup was unrevealing for an etiology. - continue with cardiac monitor  - patient should be referred back to Dr. Melburn PopperNasher with cardiology by her PCP (we discussed that she only needs to have 1 PCP in order to have good continuity of care).

## 2015-11-11 NOTE — Telephone Encounter (Signed)
Patient was recently hospitalized and PCP was listed as Dr. Natale MilchLancaster in Epic.  Was followed by [facility] during last hospitalization and had followup appt scheduled with Dr. Leonides Schanzorsey yesterday.  Patient/husband stated that her PCP is Dr. Nadyne [facility]ombesKaren [facility] at [facility].  Called patient/husband to clarify correct PCP, and they stated PCP is Corporate treasurerichter.  PCP was recently changed in Epic per [facility].  Informed to complete ROI when they return to Dr. Wendee Coppichter's office

## 2015-11-11 NOTE — Assessment & Plan Note (Signed)
Patient noted to have a dominant mass arising from the right lobe of the thyroid measuring 2.6 x 2.3 cm. When compared to prior imaging it seems smaller. Unsure if this has been aspirated in the past (from previous notes, the patient reported this was done in 2008, however no procedures noted in our system).  I am curious what work up her PCP, Dr. Hal Hopeichter has preformed. - consider thyroid ultrasound if not done recently - last TSH performed in our system was 2013; consider repeating if not done by PCP recently. - advised patient's husband follow up with her PCP, Dr. Hal Hopeichter.

## 2015-11-14 ENCOUNTER — Other Ambulatory Visit: Payer: Self-pay | Admitting: Family Medicine

## 2015-12-07 ENCOUNTER — Other Ambulatory Visit: Payer: Self-pay | Admitting: Family Medicine

## 2015-12-07 DIAGNOSIS — K219 Gastro-esophageal reflux disease without esophagitis: Secondary | ICD-10-CM

## 2016-02-11 ENCOUNTER — Other Ambulatory Visit: Payer: Self-pay | Admitting: Family Medicine

## 2016-02-24 ENCOUNTER — Other Ambulatory Visit: Payer: Self-pay | Admitting: Family Medicine

## 2016-02-24 DIAGNOSIS — I10 Essential (primary) hypertension: Secondary | ICD-10-CM

## 2016-02-29 ENCOUNTER — Other Ambulatory Visit: Payer: Self-pay | Admitting: *Deleted

## 2016-07-07 ENCOUNTER — Emergency Department (HOSPITAL_COMMUNITY)
Admission: EM | Admit: 2016-07-07 | Discharge: 2016-07-07 | Disposition: A | Discharge status: Home / Self Care | Payer: Medicare Other | Attending: Emergency Medicine | Admitting: Emergency Medicine

## 2016-07-07 ENCOUNTER — Encounter (HOSPITAL_COMMUNITY): Payer: Self-pay | Admitting: Emergency Medicine

## 2016-07-07 DIAGNOSIS — E876 Hypokalemia: Secondary | ICD-10-CM

## 2016-07-07 DIAGNOSIS — I129 Hypertensive chronic kidney disease with stage 1 through stage 4 chronic kidney disease, or unspecified chronic kidney disease: Secondary | ICD-10-CM | POA: Diagnosis not present

## 2016-07-07 DIAGNOSIS — Z79899 Other long term (current) drug therapy: Secondary | ICD-10-CM | POA: Diagnosis not present

## 2016-07-07 DIAGNOSIS — M25531 Pain in right wrist: Secondary | ICD-10-CM | POA: Diagnosis not present

## 2016-07-07 DIAGNOSIS — M79641 Pain in right hand: Secondary | ICD-10-CM | POA: Diagnosis present

## 2016-07-07 DIAGNOSIS — Z7982 Long term (current) use of aspirin: Secondary | ICD-10-CM | POA: Diagnosis not present

## 2016-07-07 DIAGNOSIS — N183 Chronic kidney disease, stage 3 (moderate): Secondary | ICD-10-CM | POA: Diagnosis not present

## 2016-07-07 DIAGNOSIS — M109 Gout, unspecified: Secondary | ICD-10-CM

## 2016-07-07 LAB — CBC WITH DIFFERENTIAL/PLATELET
Basophils Absolute: 0 10*3/uL (ref 0.0–0.1)
Basophils Relative: 0 %
EOS ABS: 0 10*3/uL (ref 0.0–0.7)
EOS PCT: 0 %
HCT: 39.3 % (ref 36.0–46.0)
Hemoglobin: 13.1 g/dL (ref 12.0–15.0)
LYMPHS ABS: 1.3 10*3/uL (ref 0.7–4.0)
Lymphocytes Relative: 16 %
MCH: 30.2 pg (ref 26.0–34.0)
MCHC: 33.3 g/dL (ref 30.0–36.0)
MCV: 90.6 fL (ref 78.0–100.0)
Monocytes Absolute: 0.8 10*3/uL (ref 0.1–1.0)
Monocytes Relative: 10 %
Neutro Abs: 5.9 10*3/uL (ref 1.7–7.7)
Neutrophils Relative %: 74 %
PLATELETS: 227 10*3/uL (ref 150–400)
RBC: 4.34 MIL/uL (ref 3.87–5.11)
RDW: 13.4 % (ref 11.5–15.5)
WBC: 8.1 10*3/uL (ref 4.0–10.5)

## 2016-07-07 LAB — COMPREHENSIVE METABOLIC PANEL
ALK PHOS: 58 U/L (ref 38–126)
ALT: 12 U/L — ABNORMAL LOW (ref 14–54)
ANION GAP: 12 (ref 5–15)
AST: 19 U/L (ref 15–41)
Albumin: 3.5 g/dL (ref 3.5–5.0)
BILIRUBIN TOTAL: 1.3 mg/dL — AB (ref 0.3–1.2)
BUN: 16 mg/dL (ref 6–20)
CO2: 21 mmol/L — ABNORMAL LOW (ref 22–32)
Calcium: 9.3 mg/dL (ref 8.9–10.3)
Chloride: 105 mmol/L (ref 101–111)
Creatinine, Ser: 1.39 mg/dL — ABNORMAL HIGH (ref 0.44–1.00)
GFR, EST AFRICAN AMERICAN: 40 mL/min — AB (ref >60)
GFR, EST NON AFRICAN AMERICAN: 35 mL/min — AB (ref >60)
Glucose, Bld: 103 mg/dL — ABNORMAL HIGH (ref 65–99)
Potassium: 2.8 mmol/L — ABNORMAL LOW (ref 3.5–5.1)
SODIUM: 138 mmol/L (ref 135–145)
TOTAL PROTEIN: 7 g/dL (ref 6.5–8.1)

## 2016-07-07 LAB — I-STAT CG4 LACTIC ACID, ED: LACTIC ACID, VENOUS: 1.98 mmol/L — AB (ref 0.5–1.9)

## 2016-07-07 MED ORDER — POTASSIUM CHLORIDE CRYS ER 20 MEQ PO TBCR: 20.0000 meq | EXTENDED_RELEASE_TABLET | Freq: Two times a day (BID) | ORAL | 0 refills | Status: AC

## 2016-07-07 MED ORDER — INDOMETHACIN 25 MG PO CAPS: 25.0000 mg | ORAL_CAPSULE | Freq: Three times a day (TID) | ORAL | 0 refills | Status: AC | PRN

## 2016-07-07 MED ORDER — HYDROCODONE-ACETAMINOPHEN 5-325 MG PO TABS: 1.0000 | ORAL_TABLET | Freq: Four times a day (QID) | ORAL | 0 refills | Status: AC | PRN

## 2016-07-07 MED ORDER — HYDROCODONE-ACETAMINOPHEN 5-325 MG PO TABS
2.0000 | ORAL_TABLET | Freq: Once | ORAL | Status: AC
  Administered 2016-07-07: 2 via ORAL
  Filled 2016-07-07: qty 2

## 2016-07-07 NOTE — ED Triage Notes (Signed)
Pt has right hand and wrist redness and swelling. Pt was seen at PCP yesterday and no better today. Pt also has chills.

## 2016-07-07 NOTE — ED Provider Notes (Signed)
[facility]-EMERGENCY DEPT Provider Note   CSN: [id number] Arrival date & time: 07/07/16  1131  By signing my name below, I, Marnette Burgessyan Andrew Long, attest that this documentation has been prepared under the direction and in the presence of Geoffery Lyonsouglas Alexa Golebiewski, MD . Electronically Signed: Marnette Burgessyan Andrew Long, Scribe. 07/07/2016. 12:42 PM.   History   Chief Complaint Chief Complaint  Patient presents with  . Hand Pain    The history is provided by the patient. No language interpreter was used.  Hand Pain     HPI Comments:  Destiny Moore is a 83 y.o. female who presents to the Emergency Department complaining of 10/10, sudden onset right hand redness and swelling onset yesterday morning. She states she just woke up and the pain and swelling was present, gradually worsening since that time. Pt seen by a NP yesterday morning with blood work and EKG taken, but they did not address the wrist pain. Pt has associated symptoms of right wrist pain, chills, and fever. She notes her left wrist feels normal with no pain. No h/o gout. Exertion and any movement exacerbate her pain. She did not try anything for relief of her pain at home PTA. No h/o DM, CAD. Pt denies any other complaints at this time.   Past Medical History:  Diagnosis Date  . Allergy   . Anxiety   . Arthritis   . CKD (chronic kidney disease)    stage 3 as of 04/2014.   Marland Kitchen. Depression   . Hyperlipidemia   . Hypertension   . Osteoporosis   . UTI (urinary tract infection)     Patient Active Problem List   Diagnosis Date Noted  . Left-sided weakness   . Syncope 11/04/2015  . Shingles 01/15/2015  . Acute cystitis 12/22/2014  . Bilateral lower extremity edema 12/22/2014  . Other chest pain 12/09/2014  . Multiple thyroid nodules 12/09/2014  . Urinary incontinence 11/13/2014  . Degenerative arthritis of left shoulder region 11/13/2014  . Recurrent falls 11/06/2014    Class: Acute  . Epistaxis 08/01/2014  . S/P laparoscopic cholecystectomy  05/14/2014  . Memory loss 05/14/2014  . Common bile duct dilation   . Calculus of bile duct with obstruction and without cholangitis or cholecystitis   . Abdominal pain 05/05/2014  . Biliary stone 05/05/2014  . RUQ abdominal pain 05/05/2014  . GERD (gastroesophageal reflux disease) 04/17/2014  . Cholelithiasis 04/17/2014  . Overactive bladder 09/20/2013  . Bilateral lower extremity pain 08/21/2013  . Constipation 01/20/2011  . Abnormal gait 01/20/2011  . Depression 07/14/2010  . CKD (chronic kidney disease) stage 3, GFR 30-59 ml/min 06/04/2010  . UNSPECIFIED GENITAL PROLAPSE 05/27/2010  . Hyperlipidemia 06/17/2009  . PTSD 06/17/2009  . Essential hypertension 06/17/2009  . Osteoarthritis of multiple joints 06/17/2009  . Osteoporosis 06/17/2009    Past Surgical History:  Procedure Laterality Date  . ABDOMINAL HYSTERECTOMY    . bladder tact    . CHOLECYSTECTOMY N/A 05/08/2014   Procedure: LAPAROSCOPIC CHOLECYSTECTOMY WITH INTRAOPERATIVE CHOLANGIOGRAM;  Surgeon: Manus RuddMatthew Tsuei, MD;  Location: [facility] OR;  Service: General;  Laterality: N/A;  . hystrectomy    . INCONTINENCE SURGERY      OB History    No data available       Home Medications    Prior to Admission medications   Medication Sig Start Date End Date Taking? Authorizing Provider  acetaminophen (TYLENOL) 500 MG tablet Take 1 tablet (500 mg total) by mouth every 6 (six) hours as needed. 03/07/15  Cheri Fowler, PA-C  alendronate (FOSAMAX) 70 MG tablet Take 70 mg by mouth every Monday. Take with a full glass of water on an empty stomach.    Historical Provider, MD  amLODipine (NORVASC) 10 MG tablet Take 1 tablet by mouth daily 06/03/15   Smitty Cords, DO  Ascorbic Acid (VITAMIN C PO) Take 1 tablet by mouth daily.    Historical Provider, MD  aspirin EC 81 MG tablet Take 81 mg by mouth daily.    Historical Provider, MD  cholecalciferol (VITAMIN D) 1000 UNITS tablet Take 1,000 Units by mouth daily.    Historical Provider,  MD  CRANBERRY PO Take 1 tablet by mouth daily.    Historical Provider, MD  Cyanocobalamin (VITAMIN B-12 PO) Take 1 tablet by mouth daily.    Historical Provider, MD  donepezil (ARICEPT) 5 MG tablet Take 5 mg by mouth every evening. 10/19/15   Historical Provider, MD  EPINEPHrine 0.3 mg/0.3 mL IJ SOAJ injection Inject 0.3 mLs (0.3 mg total) into the muscle once. Patient taking differently: Inject 0.3 mg into the muscle once as needed (allergic reaction).  06/02/15   Smitty Cords, DO  gabapentin (NEURONTIN) 100 MG capsule TAKE 1 CAPSULE BY MOUTH THREE TIMES DAILY Patient taking differently: take 300mg  by mouth at bedtime 10/19/15   Smitty Cords, DO  hydrochlorothiazide (MICROZIDE) 12.5 MG capsule Take 12.5 mg by mouth daily. 01/07/15   Historical Provider, MD  loperamide (IMODIUM) 2 MG capsule take 1 capsule BY MOUTH AS NEEDED for diarrhea or loose stools 07/20/15   Smitty Cords, DO  OVER THE COUNTER MEDICATION Place 1 drop into both eyes 2 (two) times daily as needed (dry eyes/burning). Over the counter lubricating eye drops    Historical Provider, MD  pantoprazole (PROTONIX) 40 MG tablet Take 1 tablet by mouth daily 07/22/15   Smitty Cords, DO  simvastatin (ZOCOR) 20 MG tablet Take 1 tablet by mouth daily 10/02/15   Smitty Cords, DO  traMADol Janean Sark) 50 MG tablet Take 1-2 tablets by mouth every 12 hours as needed for pain 10/19/15   Smitty Cords, DO    Family History Family History  Problem Relation Age of Onset  . Heart disease Mother   . Cancer Sister     breast  . Cancer Brother     skin, kidney, lung    Social History Social History  Substance Use Topics  . Smoking status: Never Smoker  . Smokeless tobacco: Never Used  . Alcohol use No     Allergies   Carvedilol; Coreg; Lisinopril; and Sertraline hcl   Review of Systems Review of Systems   Physical Exam Updated Vital Signs BP 110/73 (BP Location: Right Arm)    Pulse 76   Temp 99.3 F (37.4 C) (Oral)   Resp 16   SpO2 100%   Physical Exam  Constitutional: She is oriented to person, place, and time. She appears well-developed and well-nourished.  HENT:  Head: Normocephalic.  Eyes: Conjunctivae are normal.  Cardiovascular: Normal rate.   Pulmonary/Chest: Effort normal.  Abdominal: She exhibits no distension.  Musculoskeletal: Normal range of motion.  Right wrist has swelling, warmth, and erythema. There is pain with any ROM.   Neurological: She is alert and oriented to person, place, and time.  Skin: Skin is warm and dry.  Psychiatric: She has a normal mood and affect.  Nursing note and vitals reviewed.    ED Treatments / Results  DIAGNOSTIC STUDIES:  Oxygen Saturation  is 100% on RA, normal by my interpretation.    COORDINATION OF CARE:  12:39 PM Discussed treatment plan with pt at bedside including lab work and anti-inflammatories with pain medication and pt agreed to plan.  Labs (all labs ordered are listed, but only abnormal results are displayed) Labs Reviewed  I-STAT CG4 LACTIC ACID, ED - Abnormal; Notable for the following:       Result Value   Lactic Acid, Venous 1.98 (*)    All other components within normal limits  CBC WITH DIFFERENTIAL/PLATELET  COMPREHENSIVE METABOLIC PANEL    EKG  EKG Interpretation None       Radiology No results found.  Procedures Procedures (including critical care time)  Medications Ordered in ED Medications - No data to display   Initial Impression / Assessment and Plan / ED Course  I have reviewed the triage vital signs and the nursing notes.  Pertinent labs & imaging results that were available during my care of the patient were reviewed by me and considered in my medical decision making (see chart for details).  Patient presents with a swollen, erythematous right wrist. I highly suspect this to be gout. She has no fever or white count. She will be treated with indomethacin,  pain medication, and is to return if she becomes febrile or her pain or swelling worsens.  She also has a potassium of 2.8. She will be given a short course of oral potassium replacement as well.  Final Clinical Impressions(s) / ED Diagnoses   Final diagnoses:  None    New Prescriptions New Prescriptions   No medications on file   I personally performed the services described in this documentation, which was scribed in my presence. The recorded information has been reviewed and is accurate.        Geoffery Lyons, MD 07/07/16 [phone]

## 2016-07-07 NOTE — Discharge Instructions (Signed)
Indocin and potassiumas prescribed.  Hydrocodone as prescribed as needed for pain.  Return to the emergency department if you develop worsening pain, increased redness, high fevers, or other new and concerning symptoms.

## 2016-10-11 ENCOUNTER — Encounter: Payer: Self-pay | Admitting: Physician Assistant

## 2016-10-11 ENCOUNTER — Ambulatory Visit (INDEPENDENT_AMBULATORY_CARE_PROVIDER_SITE_OTHER): Payer: Medicare Other | Admitting: Physician Assistant

## 2016-10-11 VITALS — BP 128/72 | HR 67 | Temp 97.7°F | Resp 18 | Ht 61.0 in | Wt 140.6 lb

## 2016-10-11 DIAGNOSIS — L237 Allergic contact dermatitis due to plants, except food: Secondary | ICD-10-CM | POA: Diagnosis not present

## 2016-10-11 MED ORDER — TRIAMCINOLONE ACETONIDE 0.5 % EX CREA: 1.0000 "application " | TOPICAL_CREAM | Freq: Three times a day (TID) | CUTANEOUS | 0 refills | Status: AC

## 2016-10-11 MED ORDER — LORATADINE 10 MG PO TABS: 10.0000 mg | ORAL_TABLET | Freq: Every day | ORAL | 0 refills | Status: AC

## 2016-10-11 NOTE — Progress Notes (Signed)
Subjective:    Patient ID: Destiny Moore, female    DOB: 02-23-1933, 83 y.o.   MRN: [id number] PCP: Dois Davenport, MD  Chief Complaint  Patient presents with  . Poison Ivy    on hands and right ear x3days       HPI: 83 y/o F presents today for complaints of a rash that she thinks may be poison ivy.  Started 3-4 days ago after doing some yard work. Has rash on right hand between 1st and second digit and on right hip. Other areas like face and thigh are itchy but no rash has erupted. She has been using OTC hydrocortisone 1% cream with minimal relief. Denies signs of secondary infection. Denies fever, chills, nausea, vomiting, diarrhea.   Patient Active Problem List   Diagnosis Date Noted  . Left-sided weakness   . Syncope 11/04/2015  . Shingles 01/15/2015  . Acute cystitis 12/22/2014  . Bilateral lower extremity edema 12/22/2014  . Other chest pain 12/09/2014  . Multiple thyroid nodules 12/09/2014  . Urinary incontinence 11/13/2014  . Degenerative arthritis of left shoulder region 11/13/2014  . Recurrent falls 11/06/2014    Class: Acute  . Epistaxis 08/01/2014  . S/P laparoscopic cholecystectomy 05/14/2014  . Memory loss 05/14/2014  . Common bile duct dilation   . Calculus of bile duct with obstruction and without cholangitis or cholecystitis   . Abdominal pain 05/05/2014  . Biliary stone 05/05/2014  . RUQ abdominal pain 05/05/2014  . GERD (gastroesophageal reflux disease) 04/17/2014  . Cholelithiasis 04/17/2014  . Overactive bladder 09/20/2013  . Bilateral lower extremity pain 08/21/2013  . Constipation 01/20/2011  . Abnormal gait 01/20/2011  . Depression 07/14/2010  . CKD (chronic kidney disease) stage 3, GFR 30-59 ml/min 06/04/2010  . UNSPECIFIED GENITAL PROLAPSE 05/27/2010  . Hyperlipidemia 06/17/2009  . PTSD 06/17/2009  . Essential hypertension 06/17/2009  . Osteoarthritis of multiple joints 06/17/2009  . Osteoporosis 06/17/2009   Past Medical History:    Diagnosis Date  . Allergy   . Anxiety   . Arthritis   . CKD (chronic kidney disease)    stage 3 as of 04/2014.   Marland Kitchen Depression   . Hyperlipidemia   . Hypertension   . Osteoporosis   . UTI (urinary tract infection)    Allergies  Allergen Reactions  . Carvedilol Anaphylaxis  . Coreg Anaphylaxis  . Lisinopril Anaphylaxis  . Sertraline Hcl Other (See Comments)    Could not feed herself (no hand to mouth co-ordination)     Review of Systems  Constitutional: Negative for chills and fever.  Gastrointestinal: Negative for diarrhea, nausea and vomiting.  Skin: Positive for rash (over right hand and right hip).       Objective:   Physical Exam  Constitutional: She appears well-developed and well-nourished. No distress.  BP 128/72   Pulse 67   Temp 97.7 F (36.5 C) (Oral)   Resp 18   Ht 5\' 1"  (1.549 m)   Wt 140 lb 9.6 oz (63.8 kg)   SpO2 97%   BMI 26.57 kg/m    HENT:  Head: Normocephalic and atraumatic.  Eyes: Conjunctivae are normal.  Cardiovascular: Normal rate, regular rhythm, normal heart sounds and intact distal pulses.   Pulmonary/Chest: Effort normal and breath sounds normal.  Musculoskeletal:       Hands:      Legs: Skin: Skin is warm and dry. Rash noted. Rash is vesicular. She is not diaphoretic.  Right hand: thickened skin with vesicular rash  with erythematous base over ventral aspect of hand between first and second digit, with associated edema, without secondary signs of infection.  Right hip: erythematous maculopapular rash along iliac crest          Assessment & Plan:  1. Contact dermatitis due to poison ivy Keep skin cool and dry. Apply cream to affected areas up to 3 times a day.  - loratadine (CLARITIN) 10 MG tablet; Take 1 tablet (10 mg total) by mouth daily.  Dispense: 30 tablet; Refill: 0 - triamcinolone cream (KENALOG) 0.5 %; Apply 1 application topically 3 (three) times daily.  Dispense: 30 g; Refill: 0  Return if symptoms worsen or fail  to improve.

## 2016-10-11 NOTE — Progress Notes (Signed)
Patient ID: Destiny Moore, female    DOB: January 03, 1933, 83 y.o.   MRN: [id number]  PCP: Dois Davenport, MD  Chief Complaint  Patient presents with  . Poison Ivy    on hands and right ear x3days      Subjective:   Presents for evaluation of itchy rash on the RIGHT hand, RIGHT hip and RIGHT ear for about 3 days, after doing some yardwork. She is accompanied by her husband.  Her husband usually does the yard work, but her has been dealing with some balance issues since Easter, and so she has been doing more of this type of work.  A friend recommended that she request a steroid shot.  The itching has kept her awake some nights. She notes itchiness of the face and legs, though there is not rash there. OTC steroid cream without benefit.  No symptoms involving the mouth or throat. No cough.   Review of Systems As above. No SOB, CP, dizziness. No HA.    Patient Active Problem List   Diagnosis Date Noted  . Left-sided weakness   . Syncope 11/04/2015  . Shingles 01/15/2015  . Acute cystitis 12/22/2014  . Bilateral lower extremity edema 12/22/2014  . Other chest pain 12/09/2014  . Multiple thyroid nodules 12/09/2014  . Urinary incontinence 11/13/2014  . Degenerative arthritis of left shoulder region 11/13/2014  . Recurrent falls 11/06/2014    Class: Acute  . Epistaxis 08/01/2014  . S/P laparoscopic cholecystectomy 05/14/2014  . Memory loss 05/14/2014  . Common bile duct dilation   . Calculus of bile duct with obstruction and without cholangitis or cholecystitis   . Abdominal pain 05/05/2014  . Biliary stone 05/05/2014  . RUQ abdominal pain 05/05/2014  . GERD (gastroesophageal reflux disease) 04/17/2014  . Cholelithiasis 04/17/2014  . Overactive bladder 09/20/2013  . Bilateral lower extremity pain 08/21/2013  . Constipation 01/20/2011  . Abnormal gait 01/20/2011  . Depression 07/14/2010  . CKD (chronic kidney disease) stage 3, GFR 30-59 ml/min 06/04/2010  .  UNSPECIFIED GENITAL PROLAPSE 05/27/2010  . Hyperlipidemia 06/17/2009  . PTSD 06/17/2009  . Essential hypertension 06/17/2009  . Osteoarthritis of multiple joints 06/17/2009  . Osteoporosis 06/17/2009     Prior to Admission medications   Medication Sig Start Date End Date Taking? Authorizing Provider  acetaminophen (TYLENOL) 500 MG tablet Take 1 tablet (500 mg total) by mouth every 6 (six) hours as needed. 03/07/15  Yes Cheri Fowler, PA-C  alendronate (FOSAMAX) 70 MG tablet Take 70 mg by mouth every Monday. Take with a full glass of water on an empty stomach.   Yes [provider]  amLODipine (NORVASC) 10 MG tablet Take 1 tablet by mouth daily 06/03/15  Yes Karamalegos, Netta Neat, DO  Ascorbic Acid (VITAMIN C PO) Take 1 tablet by mouth daily.   Yes [provider]  aspirin EC 81 MG tablet Take 81 mg by mouth daily.   Yes [provider]  cholecalciferol (VITAMIN D) 1000 UNITS tablet Take 1,000 Units by mouth daily.   Yes [provider]  CRANBERRY PO Take 1 tablet by mouth daily.   Yes [provider]  Cyanocobalamin (VITAMIN B-12 PO) Take 1 tablet by mouth daily.   Yes [provider]  donepezil (ARICEPT) 5 MG tablet Take 5 mg by mouth every evening. 10/19/15  Yes [provider]  EPINEPHrine 0.3 mg/0.3 mL IJ SOAJ injection Inject 0.3 mLs (0.3 mg total) into the muscle once. Patient taking differently:  Inject 0.3 mg into the muscle once as needed (allergic reaction).  06/02/15  Yes Karamalegos, Netta NeatAlexander J, DO  gabapentin (NEURONTIN) 100 MG capsule TAKE 1 CAPSULE BY MOUTH THREE TIMES DAILY Patient taking differently: take 300mg  by mouth at bedtime 10/19/15  Yes Karamalegos, Netta NeatAlexander J, DO  hydrochlorothiazide (MICROZIDE) 12.5 MG capsule Take 12.5 mg by mouth daily. 01/07/15  Yes [provider]  HYDROcodone-acetaminophen (NORCO) 5-325 MG tablet Take 1-2 tablets by mouth every 6 (six) hours as needed. 07/07/16  Yes Delo, Riley Lamouglas,  MD  indomethacin (INDOCIN) 25 MG capsule Take 1 capsule (25 mg total) by mouth 3 (three) times daily as needed. 07/07/16  Yes Delo, Riley Lamouglas, MD  loperamide (IMODIUM) 2 MG capsule take 1 capsule BY MOUTH AS NEEDED for diarrhea or loose stools 07/20/15  Yes Karamalegos, Alexander J, DO  OVER THE COUNTER MEDICATION Place 1 drop into both eyes 2 (two) times daily as needed (dry eyes/burning). Over the counter lubricating eye drops   Yes [provider]  pantoprazole (PROTONIX) 40 MG tablet Take 1 tablet by mouth daily 07/22/15  Yes Karamalegos, Netta NeatAlexander J, DO  potassium chloride SA (K-DUR,KLOR-CON) 20 MEQ tablet Take 1 tablet (20 mEq total) by mouth 2 (two) times daily. 07/07/16  Yes Geoffery Lyonselo, Douglas, MD  simvastatin (ZOCOR) 20 MG tablet Take 1 tablet by mouth daily 10/02/15  Yes Karamalegos, Netta NeatAlexander J, DO  traMADol (ULTRAM) 50 MG tablet Take 1-2 tablets by mouth every 12 hours as needed for pain 10/19/15  Yes Smitty CordsKaramalegos, Alexander J, DO     Allergies  Allergen Reactions  . Carvedilol Anaphylaxis  . Coreg Anaphylaxis  . Lisinopril Anaphylaxis  . Sertraline Hcl Other (See Comments)    Could not feed herself (no hand to mouth co-ordination)       Objective:  Physical Exam  Constitutional: She is oriented to person, place, and time. She appears well-developed and well-nourished. She is active and cooperative. No distress.  BP 128/72   Pulse 67   Temp 97.7 F (36.5 C) (Oral)   Resp 18   Ht 5\' 1"  (1.549 m)   Wt 140 lb 9.6 oz (63.8 kg)   SpO2 97%   BMI 26.57 kg/m    Eyes: Conjunctivae are normal.  Pulmonary/Chest: Effort normal.  Neurological: She is alert and oriented to person, place, and time.  Skin: Skin is warm and dry. Lesion noted.     No tenderness. No induration. Patient has applied a topical product leaving a whitish film on the skin, consistent with calamine lotion.  Psychiatric: She has a normal mood and affect. Her speech is normal and behavior is normal.        Assessment & Plan:   1. Contact dermatitis due to poison ivy Given her underlying chronic medical problems, elect against systemic steroids. Loratadine and higher potency topical steroid. Keep cool and dry. Wash daily with soap and water. Follow-up here or with her PCP if symptoms worsen/persist. - loratadine (CLARITIN) 10 MG tablet; Take 1 tablet (10 mg total) by mouth daily.  Dispense: 30 tablet; Refill: 0 - triamcinolone cream (KENALOG) 0.5 %; Apply 1 application topically 3 (three) times daily.  Dispense: 30 g; Refill: 0  Return if symptoms worsen or fail to improve.   Fernande Brashelle S. Sahmya Arai, PA-C Primary Care at [facility]

## 2016-10-11 NOTE — Patient Instructions (Addendum)
Stay as cool and dry as you can. Getting hot and sweaty can make the itching worse. Use cooler water for your bath, hotter water can increase the itching.     IF you received an x-ray today, you will receive an invoice from [facility]. Please contact [facility] at [phone] with questions or concerns regarding your invoice.   IF you received labwork today, you will receive an invoice from [facility]. Please contact [facility] at [phone] with questions or concerns regarding your invoice.   Our billing staff will not be able to assist you with questions regarding bills from these companies.  You will be contacted with the lab results as soon as they are available. The fastest way to get your results is to activate your My Chart account. Instructions are located on the last page of this paperwork. If you have not heard from [place] regarding the results in 2 weeks, please contact this office.     Poison Ivy Dermatitis Poison ivy dermatitis is redness and soreness (inflammation) of the skin. It is caused by a chemical that is found on the leaves of the poison ivy plant. You may also have itching, a rash, and blisters. Symptoms often clear up in 1-2 weeks. You may get this condition by touching a poison ivy plant. You can also get it by touching something that has the chemical on it. This may include animals or objects that have come in contact with the plant. Follow these instructions at home: General instructions  Take or apply over-the-counter and prescription medicines only as told by your doctor.  If you touch poison ivy, wash your skin with soap and cold water right away.  Use hydrocortisone creams or calamine lotion as needed to help with itching.  Take oatmeal baths as needed. Use colloidal oatmeal. You can get this at a pharmacy or grocery store. Follow the instructions on the package.  Do not scratch or rub your skin.  While you have the rash, wash your  clothes right after you wear them. Prevention  Know what poison ivy looks like so you can avoid it. This plant has three leaves with flowering branches on a single stem. The leaves are glossy. They have uneven edges that come to a point at the front.  If you have touched poison ivy, wash with soap and water right away. Be sure to wash under your fingernails.  When hiking or camping, wear long pants, a long-sleeved shirt, tall socks, and hiking boots. You can also use a lotion on your skin that helps to prevent contact with the chemical on the plant.  If you think that your clothes or outdoor gear came in contact with poison ivy, rinse them off with a garden hose before you bring them inside your house. Contact a doctor if:  You have open sores in the rash area.  You have more redness, swelling, or pain in the affected area.  You have redness that spreads beyond the rash area.  You have fluid, blood, or pus coming from the affected area.  You have a fever.  You have a rash over a large area of your body.  You have a rash on your eyes, mouth, or genitals.  Your rash does not get better after a few days. Get help right away if:  Your face swells or your eyes swell shut.  You have trouble breathing.  You have trouble swallowing. This information is not intended to replace advice given to you by your  health care provider. Make sure you discuss any questions you have with your health care provider. Document Released: 05/21/2010 Document Revised: 09/24/2015 Document Reviewed: 09/24/2014 Elsevier Interactive Patient Education  [employer]2018 Elsevier Inc.

## 2016-11-14 ENCOUNTER — Ambulatory Visit (INDEPENDENT_AMBULATORY_CARE_PROVIDER_SITE_OTHER): Payer: Medicare Other | Admitting: Physician Assistant

## 2016-11-14 ENCOUNTER — Encounter: Payer: Self-pay | Admitting: Physician Assistant

## 2016-11-14 VITALS — BP 105/64 | HR 77 | Temp 98.5°F | Resp 16 | Ht 61.0 in | Wt 139.0 lb

## 2016-11-14 DIAGNOSIS — H60501 Unspecified acute noninfective otitis externa, right ear: Secondary | ICD-10-CM

## 2016-11-14 MED ORDER — HYDROCORTISONE-ACETIC ACID 1-2 % OT SOLN: 4.0000 [drp] | Freq: Three times a day (TID) | OTIC | 0 refills | Status: AC

## 2016-11-14 MED ORDER — ACETIC ACID 2 % OT SOLN
4.0000 [drp] | Freq: Three times a day (TID) | OTIC | 0 refills | Status: AC
Start: 2016-11-14 — End: ?

## 2016-11-14 MED ORDER — CEPHALEXIN 250 MG PO CAPS: 250.0000 mg | ORAL_CAPSULE | Freq: Three times a day (TID) | ORAL | 0 refills | Status: AC

## 2016-11-14 NOTE — Progress Notes (Signed)
PRIMARY CARE AT [facility] [address], [place] [address] [phone]  Date:  11/14/2016   Name:  Destiny Moore   DOB:  October 02, 1932   MRN:  [id number]  PCP:  Dois Davenport, MD    History of Present Illness:  Destiny Moore is a 83 y.o. female patient who presents to PCP with  Chief Complaint  Patient presents with  . Ear Pain    x2 weeks with drainage     2 weeks of ear pain of the right ear.  She feels like she can not hear well.  She tried olive oil and ear wax removal, which did not help much. No fever.  No congestion or cough.  Hurts on the inside of the ear.    Patient Active Problem List   Diagnosis Date Noted  . Left-sided weakness   . Syncope 11/04/2015  . Shingles 01/15/2015  . Acute cystitis 12/22/2014  . Bilateral lower extremity edema 12/22/2014  . Other chest pain 12/09/2014  . Multiple thyroid nodules 12/09/2014  . Urinary incontinence 11/13/2014  . Degenerative arthritis of left shoulder region 11/13/2014  . Recurrent falls 11/06/2014    Class: Acute  . Epistaxis 08/01/2014  . S/P laparoscopic cholecystectomy 05/14/2014  . Memory loss 05/14/2014  . Common bile duct dilation   . Calculus of bile duct with obstruction and without cholangitis or cholecystitis   . Abdominal pain 05/05/2014  . Biliary stone 05/05/2014  . RUQ abdominal pain 05/05/2014  . GERD (gastroesophageal reflux disease) 04/17/2014  . Cholelithiasis 04/17/2014  . Overactive bladder 09/20/2013  . Bilateral lower extremity pain 08/21/2013  . Constipation 01/20/2011  . Abnormal gait 01/20/2011  . Depression 07/14/2010  . CKD (chronic kidney disease) stage 3, GFR 30-59 ml/min 06/04/2010  . UNSPECIFIED GENITAL PROLAPSE 05/27/2010  . Hyperlipidemia 06/17/2009  . PTSD 06/17/2009  . Essential hypertension 06/17/2009  . Osteoarthritis of multiple joints 06/17/2009  . Osteoporosis 06/17/2009    Past Medical History:  Diagnosis Date  . Allergy   . Anxiety   . Arthritis   .  CKD (chronic kidney disease)    stage 3 as of 04/2014.   Marland Kitchen Depression   . Hyperlipidemia   . Hypertension   . Osteoporosis   . UTI (urinary tract infection)     Past Surgical History:  Procedure Laterality Date  . ABDOMINAL HYSTERECTOMY    . bladder tact    . CHOLECYSTECTOMY N/A 05/08/2014   Procedure: LAPAROSCOPIC CHOLECYSTECTOMY WITH INTRAOPERATIVE CHOLANGIOGRAM;  Surgeon: Manus Rudd, MD;  Location: [facility] OR;  Service: General;  Laterality: N/A;  . hystrectomy    . INCONTINENCE SURGERY      Social History  Substance Use Topics  . Smoking status: Never Smoker  . Smokeless tobacco: Never Used  . Alcohol use No    Family History  Problem Relation Age of Onset  . Heart disease Mother   . Cancer Sister        breast  . Cancer Brother        skin, kidney, lung    Allergies  Allergen Reactions  . Carvedilol Anaphylaxis  . Coreg Anaphylaxis  . Lisinopril Anaphylaxis  . Sertraline Hcl Other (See Comments)    Could not feed herself (no hand to mouth co-ordination)    Medication list has been reviewed and updated.  Current Outpatient Prescriptions on File Prior to Visit  Medication Sig Dispense Refill  . acetaminophen (TYLENOL) 500 MG tablet Take 1 tablet (500 mg total) by  mouth every 6 (six) hours as needed. 30 tablet 0  . alendronate (FOSAMAX) 70 MG tablet Take 70 mg by mouth every Monday. Take with a full glass of water on an empty stomach.    Marland Kitchen. amLODipine (NORVASC) 10 MG tablet Take 1 tablet by mouth daily 90 tablet 2  . Ascorbic Acid (VITAMIN C PO) Take 1 tablet by mouth daily.    Marland Kitchen. aspirin EC 81 MG tablet Take 81 mg by mouth daily.    . cholecalciferol (VITAMIN D) 1000 UNITS tablet Take 1,000 Units by mouth daily.    Marland Kitchen. CRANBERRY PO Take 1 tablet by mouth daily.    . Cyanocobalamin (VITAMIN B-12 PO) Take 1 tablet by mouth daily.    Marland Kitchen. donepezil (ARICEPT) 5 MG tablet Take 5 mg by mouth every evening.  5  . EPINEPHrine 0.3 mg/0.3 mL IJ SOAJ injection Inject 0.3 mLs  (0.3 mg total) into the muscle once. (Patient taking differently: Inject 0.3 mg into the muscle once as needed (allergic reaction). ) 1 Device 1  . gabapentin (NEURONTIN) 100 MG capsule TAKE 1 CAPSULE BY MOUTH THREE TIMES DAILY (Patient taking differently: take 300mg  by mouth at bedtime) 90 capsule 3  . hydrochlorothiazide (MICROZIDE) 12.5 MG capsule Take 12.5 mg by mouth daily.    Marland Kitchen. HYDROcodone-acetaminophen (NORCO) 5-325 MG tablet Take 1-2 tablets by mouth every 6 (six) hours as needed. 15 tablet 0  . indomethacin (INDOCIN) 25 MG capsule Take 1 capsule (25 mg total) by mouth 3 (three) times daily as needed. 21 capsule 0  . loperamide (IMODIUM) 2 MG capsule take 1 capsule BY MOUTH AS NEEDED for diarrhea or loose stools 30 capsule 0  . loratadine (CLARITIN) 10 MG tablet Take 1 tablet (10 mg total) by mouth daily. 30 tablet 0  . OVER THE COUNTER MEDICATION Place 1 drop into both eyes 2 (two) times daily as needed (dry eyes/burning). Over the counter lubricating eye drops    . pantoprazole (PROTONIX) 40 MG tablet Take 1 tablet by mouth daily 30 tablet 5  . potassium chloride SA (K-DUR,KLOR-CON) 20 MEQ tablet Take 1 tablet (20 mEq total) by mouth 2 (two) times daily. 10 tablet 0  . simvastatin (ZOCOR) 20 MG tablet Take 1 tablet by mouth daily 90 tablet 1  . traMADol (ULTRAM) 50 MG tablet Take 1-2 tablets by mouth every 12 hours as needed for pain 30 tablet 0  . triamcinolone cream (KENALOG) 0.5 % Apply 1 application topically 3 (three) times daily. 30 g 0   No current facility-administered medications on file prior to visit.     ROS ROS otherwise unremarkable unless listed above.  Physical Examination: BP 105/64 (BP Location: Right Arm, Patient Position: Sitting, Cuff Size: Normal)   Pulse 77   Temp 98.5 F (36.9 C) (Oral)   Resp 16   Ht 5\' 1"  (1.549 m)   Wt 139 lb (63 kg)   SpO2 96%   BMI 26.26 kg/m  Ideal Body Weight: Weight in (lb) to have BMI = 25: 132  Physical Exam   Constitutional: She is oriented to person, place, and time. She appears well-developed and well-nourished. No distress.  HENT:  Head: Normocephalic and atraumatic.  Right ear cavum with erythema and edema. Which stretches into the external canal, however visualization of the TM are normal.    Eyes: Pupils are equal, round, and reactive to light. Conjunctivae and EOM are normal.  Cardiovascular: Normal rate and regular rhythm.  Exam reveals no friction rub.  No murmur heard. Pulmonary/Chest: Effort normal. No respiratory distress. She has no wheezes.  Neurological: She is alert and oriented to person, place, and time.  Skin: She is not diaphoretic.  Psychiatric: She has a normal mood and affect. Her behavior is normal.     Assessment and Plan: Destiny Moore is a 83 y.o. female who is here today for right ear pain. Warm compresses Tylenol for pain. Keflex q8hrs, and topical vosol.  rtc 48-72 hours if no improvement.  Acute otitis externa of right ear, unspecified type - Plan: acetic acid-hydrocortisone (VOSOL-HC) OTIC solution  Trena Platt, PA-C [facility] 7/16/20183:18 PM =

## 2016-11-14 NOTE — Patient Instructions (Addendum)
  Please use warm compresses to the area.   You can take tylenol for the pain.   If there is no improvement within the next 48-72 hours, please return.      IF you received an x-ray today, you will receive an invoice from [facility]. Please contact [facility] at [phone] with questions or concerns regarding your invoice.   IF you received labwork today, you will receive an invoice from [facility]. Please contact [facility] at [phone] with questions or concerns regarding your invoice.   Our billing staff will not be able to assist you with questions regarding bills from these companies.  You will be contacted with the lab results as soon as they are available. The fastest way to get your results is to activate your My Chart account. Instructions are located on the last page of this paperwork. If you have not heard from us regarding the results in 2 weeks, please contact this office.

## 2017-01-19 ENCOUNTER — Ambulatory Visit: Payer: Medicare Other | Admitting: Family Medicine

## 2017-01-19 NOTE — Progress Notes (Deleted)
No chief complaint on file.   HPI  Past Medical History:  Diagnosis Date  . Allergy   . Anxiety   . Arthritis   . CKD (chronic kidney disease)    stage 3 as of 04/2014.   Marland Kitchen Depression   . Hyperlipidemia   . Hypertension   . Osteoporosis   . UTI (urinary tract infection)     Current Outpatient Prescriptions  Medication Sig Dispense Refill  . acetaminophen (TYLENOL) 500 MG tablet Take 1 tablet (500 mg total) by mouth every 6 (six) hours as needed. 30 tablet 0  . acetic acid (VOSOL) 2 % otic solution Place 4 drops into the right ear 3 (three) times daily. 15 mL 0  . alendronate (FOSAMAX) 70 MG tablet Take 70 mg by mouth every Monday. Take with a full glass of water on an empty stomach.    Marland Kitchen amLODipine (NORVASC) 10 MG tablet Take 1 tablet by mouth daily 90 tablet 2  . Ascorbic Acid (VITAMIN C PO) Take 1 tablet by mouth daily.    Marland Kitchen aspirin EC 81 MG tablet Take 81 mg by mouth daily.    . cholecalciferol (VITAMIN D) 1000 UNITS tablet Take 1,000 Units by mouth daily.    Marland Kitchen CRANBERRY PO Take 1 tablet by mouth daily.    . Cyanocobalamin (VITAMIN B-12 PO) Take 1 tablet by mouth daily.    Marland Kitchen donepezil (ARICEPT) 5 MG tablet Take 5 mg by mouth every evening.  5  . EPINEPHrine 0.3 mg/0.3 mL IJ SOAJ injection Inject 0.3 mLs (0.3 mg total) into the muscle once. (Patient taking differently: Inject 0.3 mg into the muscle once as needed (allergic reaction). ) 1 Device 1  . gabapentin (NEURONTIN) 100 MG capsule TAKE 1 CAPSULE BY MOUTH THREE TIMES DAILY (Patient taking differently: take  by mouth at bedtime) 90 capsule 3  . hydrochlorothiazide (MICROZIDE) 12.5 MG capsule Take 12.5 mg by mouth daily.    Marland Kitchen HYDROcodone-acetaminophen (NORCO) 5-325 MG tablet Take 1-2 tablets by mouth every 6 (six) hours as needed. 15 tablet 0  . indomethacin (INDOCIN) 25 MG capsule Take 1 capsule (25 mg total) by mouth 3 (three) times daily as needed. 21 capsule 0  . loperamide (IMODIUM) 2 MG capsule take 1 capsule BY  MOUTH AS NEEDED for diarrhea or loose stools 30 capsule 0  . loratadine (CLARITIN) 10 MG tablet Take 1 tablet (10 mg total) by mouth daily. 30 tablet 0  . OVER THE COUNTER MEDICATION Place 1 drop into both eyes 2 (two) times daily as needed (dry eyes/burning). Over the counter lubricating eye drops    . pantoprazole (PROTONIX) 40 MG tablet Take 1 tablet by mouth daily 30 tablet 5  . potassium chloride SA (K-DUR,KLOR-CON) 20 MEQ tablet Take 1 tablet (20 mEq total) by mouth 2 (two) times daily. 10 tablet 0  . simvastatin (ZOCOR) 20 MG tablet Take 1 tablet by mouth daily 90 tablet 1  . traMADol (ULTRAM) 50 MG tablet Take 1-2 tablets by mouth every 12 hours as needed for pain 30 tablet 0  . triamcinolone cream (KENALOG) 0.5 % Apply 1 application topically 3 (three) times daily. 30 g 0   No current facility-administered medications for this visit.     Allergies:  Allergies  Allergen Reactions  . Carvedilol Anaphylaxis  . Coreg Anaphylaxis  . Lisinopril Anaphylaxis  . Sertraline Hcl Other (See Comments)    Could not feed herself (no hand to mouth co-ordination)    Past Surgical History:  Procedure Laterality Date  . ABDOMINAL HYSTERECTOMY    . bladder tact    . CHOLECYSTECTOMY N/A 05/08/2014   Procedure: LAPAROSCOPIC CHOLECYSTECTOMY WITH INTRAOPERATIVE CHOLANGIOGRAM;  Surgeon: Manus Rudd, MD;  Location: [facility] OR;  Service: General;  Laterality: N/A;  . hystrectomy    . INCONTINENCE SURGERY      Social History   Social History  . Marital status: Married    Spouse name: Destiny Moore  . Number of children: 2  . Years of education: 10   Occupational History  . Retired- Emergency planning/management officer Retired   Social History Main Topics  . Smoking status: Never Smoker  . Smokeless tobacco: Never Used  . Alcohol use No  . Drug use: No  . Sexual activity: Not on file   Other Topics Concern  . Not on file   Social History Narrative   Health Care POA:    Emergency Contact: Destiny Moore [phone] (h)    End of Life Plan:    Who lives with you: Lives with husband,  step son Destiny Moore   Any pets: 4 cats   Diet: Patient has a varied diet of protein, starch, and vegetables.    Exercise: Patient does not have any regular exercise plan.  Does not like to exercise by her self.   Seatbelts: Patient reports wearing seat belt when in vehicle.   Sun Exposure/Protection: Patient does not wear sun protection.   Hobbies: cooking, traveling, visiting family in [place]                 ROS  Objective: There were no vitals filed for this visit.  Physical Exam  Assessment and Plan There are no diagnoses linked to this encounter.   Destiny Moore [employer]

## 2017-01-20 ENCOUNTER — Ambulatory Visit (INDEPENDENT_AMBULATORY_CARE_PROVIDER_SITE_OTHER): Payer: Medicare Other | Admitting: Physician Assistant

## 2017-01-20 ENCOUNTER — Encounter: Payer: Self-pay | Admitting: Physician Assistant

## 2017-01-20 VITALS — BP 119/68 | HR 67 | Temp 98.2°F | Resp 17 | Ht 61.5 in | Wt 138.0 lb

## 2017-01-20 DIAGNOSIS — R8299 Other abnormal findings in urine: Secondary | ICD-10-CM | POA: Diagnosis not present

## 2017-01-20 DIAGNOSIS — R82998 Other abnormal findings in urine: Secondary | ICD-10-CM

## 2017-01-20 DIAGNOSIS — R300 Dysuria: Secondary | ICD-10-CM

## 2017-01-20 LAB — POC MICROSCOPIC URINALYSIS (UMFC): Mucus: ABSENT

## 2017-01-20 LAB — POCT URINALYSIS DIP (MANUAL ENTRY)
BILIRUBIN UA: NEGATIVE
BILIRUBIN UA: NEGATIVE mg/dL
GLUCOSE UA: NEGATIVE mg/dL
NITRITE UA: NEGATIVE
PH UA: 5.5 (ref 5.0–8.0)
Spec Grav, UA: 1.015 (ref 1.010–1.025)
Urobilinogen, UA: 0.2 E.U./dL

## 2017-01-20 MED ORDER — CEPHALEXIN 500 MG PO CAPS: 500.0000 mg | ORAL_CAPSULE | Freq: Two times a day (BID) | ORAL | 0 refills | Status: AC

## 2017-01-20 NOTE — Patient Instructions (Addendum)
  Your results indicate you have a UTI. I have given you a prescription for an antibiotic. Please take with food. I have sent off a urine culture and we should have those results in 48 hours. If your symptoms worsen while you are awaiting these results or you develop fever, chills, flank pian, nausea and vomiting, please seek care immediately.    Urinary Tract Infection, Adult A urinary tract infection (UTI) is an infection of any part of the urinary tract. The urinary tract includes the:  Kidneys.  Ureters.  Bladder.  Urethra.  These organs make, store, and get rid of pee (urine) in the body. Follow these instructions at home:  Take over-the-counter and prescription medicines only as told by your doctor.  If you were prescribed an antibiotic medicine, take it as told by your doctor. Do not stop taking the antibiotic even if you start to feel better.  Avoid the following drinks: ? Alcohol. ? Caffeine. ? Tea. ? Carbonated drinks.  Drink enough fluid to keep your pee clear or pale yellow.  Keep all follow-up visits as told by your doctor. This is important.  Make sure to: ? Empty your bladder often and completely. Do not to hold pee for long periods of time. ? Empty your bladder before and after sex. ? Wipe from front to back after a bowel movement if you are female. Use each tissue one time when you wipe. Contact a doctor if:  You have back pain.  You have a fever.  You feel sick to your stomach (nauseous).  You throw up (vomit).  Your symptoms do not get better after 3 days.  Your symptoms go away and then come back. Get help right away if:  You have very bad back pain.  You have very bad lower belly (abdominal) pain.  You are throwing up and cannot keep down any medicines or water. This information is not intended to replace advice given to you by your health care provider. Make sure you discuss any questions you have with your health care provider. Document  Released: 10/05/2007 Document Revised: 09/24/2015 Document Reviewed: 03/09/2015 Elsevier Interactive Patient Education  2018 Elsevier Inc.    IF you received an x-ray today, you will receive an invoice from [facility]. Please contact [facility] at [phone] with questions or concerns regarding your invoice.   IF you received labwork today, you will receive an invoice from [facility]. Please contact [facility] at [phone] with questions or concerns regarding your invoice.   Our billing staff will not be able to assist you with questions regarding bills from these companies.  You will be contacted with the lab results as soon as they are available. The fastest way to get your results is to activate your My Chart account. Instructions are located on the last page of this paperwork. If you have not heard from us regarding the results in 2 weeks, please contact this office.     

## 2017-01-20 NOTE — Progress Notes (Signed)
01/20/2017 at 10:32 AM  Destiny Moore / DOB: Apr 22, 1933 / MRN: [id number]  The patient has Hyperlipidemia; PTSD; Essential hypertension; Osteoarthritis of multiple joints; Osteoporosis; UNSPECIFIED GENITAL PROLAPSE; CKD (chronic kidney disease) stage 3, GFR 30-59 ml/min; Depression; Constipation; Abnormal gait; Bilateral lower extremity pain; Overactive bladder; GERD (gastroesophageal reflux disease); Cholelithiasis; Abdominal pain; Biliary stone; RUQ abdominal pain; Calculus of bile duct with obstruction and without cholangitis or cholecystitis; Common bile duct dilation; S/P laparoscopic cholecystectomy; Memory loss; Epistaxis; Recurrent falls; Urinary incontinence; Degenerative arthritis of left shoulder region; Other chest pain; Multiple thyroid nodules; Acute cystitis; Bilateral lower extremity edema; Shingles; Syncope; and Left-sided weakness on her problem list.  SUBJECTIVE  Destiny Moore is a 83 y.o. female who complains of dysuria and urinary hesitancy x 2 weeks.  She denies hematuria, urinary frequency, flank pain and abdominal pain. Has not tried anything for relief. Most recent UTI prior to this was about a month ago. Sometimes she uses a wash cloth to wipe off and hangs it up and reuses it.   She  has a past medical history of Allergy; Anxiety; Arthritis; CKD (chronic kidney disease); Depression; Hyperlipidemia; Hypertension; Osteoporosis; and UTI (urinary tract infection).    Medications reviewed and updated by myself where necessary, and exist elsewhere in the encounter.   Ms. Griffith is allergic to carvedilol; coreg; lisinopril; and sertraline hcl. She  reports that she has never smoked. She has never used smokeless tobacco. She reports that she does not drink alcohol or use drugs. She  has no sexual activity history on file. The patient  has a past surgical history that includes hystrectomy; bladder tact; Abdominal hysterectomy; Incontinence surgery; and Cholecystectomy (N/A,  05/08/2014).  Her family history includes Cancer in her brother and sister; Heart disease in her mother.  Review of Systems  Constitutional: Negative for chills, diaphoresis and fever.  Gastrointestinal: Negative for nausea and vomiting.    OBJECTIVE  Her  height is 5' 1.5" (1.562 m) and weight is 138 lb (62.6 kg). Her oral temperature is 98.2 F (36.8 C). Her blood pressure is 119/68 and her pulse is 67. Her respiration is 17 and oxygen saturation is 98%.  The patient's body mass index is 25.65 kg/m.  Physical Exam  Constitutional: She is oriented to person, place, and time. She appears well-developed and well-nourished.  HENT:  Head: Normocephalic and atraumatic.  Eyes: Conjunctivae are normal.  Neck: Normal range of motion.  Pulmonary/Chest: Effort normal.  Abdominal: Normal appearance. There is no tenderness. There is no CVA tenderness.  Neurological: She is alert and oriented to person, place, and time.  Skin: Skin is warm and dry.  Psychiatric: She has a normal mood and affect.  Vitals reviewed.   Results for orders placed or performed in visit on 01/20/17 (from the past 24 hour(s))  POCT urinalysis dipstick     Status: Abnormal   Collection Time: 01/20/17  9:52 AM  Result Value Ref Range   Color, UA yellow yellow   Clarity, UA cloudy (A) clear   Glucose, UA negative negative mg/dL   Bilirubin, UA negative negative   Ketones, POC UA negative negative mg/dL   Spec Grav, UA 8.295 6.213 - 1.025   Blood, UA trace-intact (A) negative   pH, UA 5.5 5.0 - 8.0   Protein Ur, POC trace (A) negative mg/dL   Urobilinogen, UA 0.2 0.2 or 1.0 E.U./dL   Nitrite, UA Negative Negative   Leukocytes, UA Small (1+) (A) Negative  POCT Microscopic Urinalysis (  [facility])     Status: Abnormal   Collection Time: 01/20/17  9:55 AM  Result Value Ref Range   WBC,UR,HPF,POC Too numerous to count  (A) None WBC/hpf   RBC,UR,HPF,POC None None RBC/hpf   Bacteria Moderate (A) None, Too numerous to count     Mucus Absent Absent   Epithelial Cells, UR Per Microscopy Few (A) None, Too numerous to count cells/hpf    ASSESSMENT & PLAN  Ela was seen today for dysuria.  Diagnoses and all orders for this visit:  Dysuria -     POCT urinalysis dipstick -     POCT Microscopic Urinalysis ([facility]) -     Urine Culture  Leukocytes in urine -     cephALEXin (KEFLEX) 500 MG capsule; Take 1 capsule (500 mg total) by mouth 2 (two) times daily.    Due to symptoms and UA findings of leukocytes and WBCs, will treat empirically for UTI. Urine culture pending. The patient was advised to call or come back to clinic if she does not see an improvement in symptoms, or worsens with the above plan.   Benjiman Core, PA-C [facility] 01/20/2017 10:32 AM

## 2017-01-22 LAB — URINE CULTURE

## 2017-03-14 ENCOUNTER — Emergency Department (HOSPITAL_COMMUNITY)
Admission: EM | Admit: 2017-03-14 | Discharge: 2017-03-14 | Disposition: A | Discharge status: Home / Self Care | Payer: Medicare Other | Attending: Emergency Medicine | Admitting: Emergency Medicine

## 2017-03-14 ENCOUNTER — Emergency Department (HOSPITAL_COMMUNITY): Payer: Medicare Other

## 2017-03-14 ENCOUNTER — Encounter (HOSPITAL_COMMUNITY): Payer: Self-pay | Admitting: *Deleted

## 2017-03-14 DIAGNOSIS — Z79899 Other long term (current) drug therapy: Secondary | ICD-10-CM | POA: Diagnosis not present

## 2017-03-14 DIAGNOSIS — I129 Hypertensive chronic kidney disease with stage 1 through stage 4 chronic kidney disease, or unspecified chronic kidney disease: Secondary | ICD-10-CM | POA: Diagnosis not present

## 2017-03-14 DIAGNOSIS — N183 Chronic kidney disease, stage 3 (moderate): Secondary | ICD-10-CM | POA: Diagnosis not present

## 2017-03-14 DIAGNOSIS — F0390 Unspecified dementia without behavioral disturbance: Secondary | ICD-10-CM | POA: Diagnosis not present

## 2017-03-14 DIAGNOSIS — N3000 Acute cystitis without hematuria: Secondary | ICD-10-CM

## 2017-03-14 DIAGNOSIS — Z7982 Long term (current) use of aspirin: Secondary | ICD-10-CM | POA: Insufficient documentation

## 2017-03-14 DIAGNOSIS — R531 Weakness: Secondary | ICD-10-CM | POA: Diagnosis present

## 2017-03-14 LAB — COMPREHENSIVE METABOLIC PANEL
ALK PHOS: 57 U/L (ref 38–126)
ALT: 12 U/L — ABNORMAL LOW (ref 14–54)
ANION GAP: 9 (ref 5–15)
AST: 30 U/L (ref 15–41)
Albumin: 4.1 g/dL (ref 3.5–5.0)
BUN: 17 mg/dL (ref 6–20)
CALCIUM: 9.4 mg/dL (ref 8.9–10.3)
CO2: 25 mmol/L (ref 22–32)
CREATININE: 1.48 mg/dL — AB (ref 0.44–1.00)
Chloride: 106 mmol/L (ref 101–111)
GFR, EST AFRICAN AMERICAN: 37 mL/min — AB (ref >60)
GFR, EST NON AFRICAN AMERICAN: 32 mL/min — AB (ref >60)
Glucose, Bld: 105 mg/dL — ABNORMAL HIGH (ref 65–99)
Potassium: 3.6 mmol/L (ref 3.5–5.1)
Sodium: 140 mmol/L (ref 135–145)
TOTAL PROTEIN: 7.1 g/dL (ref 6.5–8.1)
Total Bilirubin: 0.9 mg/dL (ref 0.3–1.2)

## 2017-03-14 LAB — CBC WITH DIFFERENTIAL/PLATELET
Basophils Absolute: 0 10*3/uL (ref 0.0–0.1)
Basophils Relative: 0 %
EOS ABS: 0 10*3/uL (ref 0.0–0.7)
EOS PCT: 0 %
HCT: 34.6 % — ABNORMAL LOW (ref 36.0–46.0)
HEMOGLOBIN: 11.7 g/dL — AB (ref 12.0–15.0)
LYMPHS ABS: 1.6 10*3/uL (ref 0.7–4.0)
Lymphocytes Relative: 25 %
MCH: 30.6 pg (ref 26.0–34.0)
MCHC: 33.8 g/dL (ref 30.0–36.0)
MCV: 90.6 fL (ref 78.0–100.0)
MONOS PCT: 9 %
Monocytes Absolute: 0.6 10*3/uL (ref 0.1–1.0)
NEUTROS PCT: 66 %
Neutro Abs: 4.3 10*3/uL (ref 1.7–7.7)
Platelets: 213 10*3/uL (ref 150–400)
RBC: 3.82 MIL/uL — AB (ref 3.87–5.11)
RDW: 13.4 % (ref 11.5–15.5)
WBC: 6.6 10*3/uL (ref 4.0–10.5)

## 2017-03-14 LAB — I-STAT TROPONIN, ED: TROPONIN I, POC: 0.01 ng/mL (ref 0.00–0.08)

## 2017-03-14 LAB — URINALYSIS, ROUTINE W REFLEX MICROSCOPIC
Bacteria, UA: NONE SEEN
Bilirubin Urine: NEGATIVE
GLUCOSE, UA: NEGATIVE mg/dL
Hgb urine dipstick: NEGATIVE
Ketones, ur: NEGATIVE mg/dL
Nitrite: NEGATIVE
PH: 6 (ref 5.0–8.0)
PROTEIN: NEGATIVE mg/dL
SPECIFIC GRAVITY, URINE: 1.011 (ref 1.005–1.030)

## 2017-03-14 LAB — LIPASE, BLOOD: Lipase: 28 U/L (ref 11–51)

## 2017-03-14 MED ORDER — DEXTROSE 5 % IV SOLN
1.0000 g | Freq: Once | INTRAVENOUS | Status: AC
  Administered 2017-03-14: 1 g via INTRAVENOUS
  Filled 2017-03-14: qty 10

## 2017-03-14 MED ORDER — CEPHALEXIN 500 MG PO CAPS: 500.0000 mg | ORAL_CAPSULE | Freq: Three times a day (TID) | ORAL | 0 refills | Status: AC

## 2017-03-14 MED ORDER — LORAZEPAM 2 MG/ML IJ SOLN
0.5000 mg | Freq: Once | INTRAMUSCULAR | Status: AC
  Administered 2017-03-14: 0.5 mg via INTRAVENOUS
  Filled 2017-03-14: qty 1

## 2017-03-14 NOTE — Discharge Instructions (Signed)
Take keflex three times daily for 5 days for UTI.   She likely has dementia. Call [facility] for workup for dementia   See your doctor  Return to ER if she has more confusion, weakness, trouble speaking, fever, vomiting.

## 2017-03-14 NOTE — ED Notes (Signed)
Bed: WA01 Expected date:  Expected time:  Means of arrival:  Comments: 

## 2017-03-14 NOTE — Care Management Note (Signed)
Case Management Note  Patient Details  Name: Destiny Moore MRN: [id number] Date of Birth: [birth year]  Subjective/Objective:                  83 y.o. female history of CKD, hypertension, hyperlipidemia, dementia, here presenting with weakness, shakiness, epigastric pain.  Action/Plan: CM consulted for HHS.  Spoke with pt's husband who states they have used [facility] before and would like to use them again.  Contacted [facility] who accepted the pt.  No further CM needs noted at this time.  Expected Discharge Date:    03/14/2017              Expected Discharge Plan:  Home w Home Health Services  Discharge planning Services  CM Consult  Post Acute Care Choice:  Home Health Choice offered to:  Patient, Spouse  HH Arranged:  RN, PT, OT, Nurse's Aide, Social Work [employer]  Status of Service:  Completed, signed off  Breyana Follansbee, Lynnae Sandhoffngela N, RN 03/14/2017, 5:17 PM

## 2017-03-14 NOTE — ED Triage Notes (Signed)
Per EMS, pt from home called for generalized weakness/AMS for the past couple days. Pt has hx of dementia. Pt states she feels shakier than usual.   NSR CBG 105 BP 127/60 HR 74 RR 16 O2 97% on RA

## 2017-03-14 NOTE — ED Provider Notes (Signed)
[facility]-EMERGENCY DEPT Provider Note   CSN: [id number] Arrival date & time: 03/14/17  1158     History   Chief Complaint Chief Complaint  Patient presents with  . Weakness    HPI Destiny Moore is a 83 y.o. female history of CKD, hypertension, hyperlipidemia, dementia, here presenting with weakness, shakiness, epigastric pain.  Patient lives at home with her husband.  Patient has been weak for the last week or so and progressively more confused and forgetful.  Today while eating lunch, patient had an episode of nausea and became more shaky.  No vomiting per the husband.  No fever at home.   The history is provided by the patient and the spouse.   Level V caveat- dementia   Past Medical History:  Diagnosis Date  . Allergy   . Anxiety   . Arthritis   . CKD (chronic kidney disease)    stage 3 as of 04/2014.   Marland Kitchen Depression   . Hyperlipidemia   . Hypertension   . Osteoporosis   . UTI (urinary tract infection)     Patient Active Problem List   Diagnosis Date Noted  . Left-sided weakness   . Syncope 11/04/2015  . Shingles 01/15/2015  . Acute cystitis 12/22/2014  . Bilateral lower extremity edema 12/22/2014  . Other chest pain 12/09/2014  . Multiple thyroid nodules 12/09/2014  . Urinary incontinence 11/13/2014  . Degenerative arthritis of left shoulder region 11/13/2014  . Recurrent falls 11/06/2014    Class: Acute  . Epistaxis 08/01/2014  . S/P laparoscopic cholecystectomy 05/14/2014  . Memory loss 05/14/2014  . Common bile duct dilation   . Calculus of bile duct with obstruction and without cholangitis or cholecystitis   . Abdominal pain 05/05/2014  . Biliary stone 05/05/2014  . RUQ abdominal pain 05/05/2014  . GERD (gastroesophageal reflux disease) 04/17/2014  . Cholelithiasis 04/17/2014  . Overactive bladder 09/20/2013  . Bilateral lower extremity pain 08/21/2013  . Constipation 01/20/2011  . Abnormal gait 01/20/2011  . Depression  07/14/2010  . CKD (chronic kidney disease) stage 3, GFR 30-59 ml/min (HCC) 06/04/2010  . UNSPECIFIED GENITAL PROLAPSE 05/27/2010  . Hyperlipidemia 06/17/2009  . PTSD 06/17/2009  . Essential hypertension 06/17/2009  . Osteoarthritis of multiple joints 06/17/2009  . Osteoporosis 06/17/2009    Past Surgical History:  Procedure Laterality Date  . ABDOMINAL HYSTERECTOMY    . bladder tact    . hystrectomy    . INCONTINENCE SURGERY      OB History    No data available       Home Medications    Prior to Admission medications   Medication Sig Start Date End Date Taking? Authorizing Provider  acetaminophen (TYLENOL) 500 MG tablet Take 1 tablet (500 mg total) by mouth every 6 (six) hours as needed. 03/07/15   Cheri Fowler, PA-C  acetic acid (VOSOL) 2 % otic solution Place 4 drops into the right ear 3 (three) times daily. 11/14/16   Garnetta Buddy, PA  alendronate (FOSAMAX) 70 MG tablet Take 70 mg by mouth every Monday. Take with a full glass of water on an empty stomach.    [provider]  amLODipine (NORVASC) 10 MG tablet Take 1 tablet by mouth daily 06/03/15   Smitty Cords, DO  Ascorbic Acid (VITAMIN C PO) Take 1 tablet by mouth daily.    [provider]  aspirin EC 81 MG tablet Take 81 mg by mouth daily.    [provider]  cholecalciferol (VITAMIN D) 1000 UNITS tablet Take 1,000 Units by mouth daily.    [provider]  CRANBERRY PO Take 1 tablet by mouth daily.    [provider]  Cyanocobalamin (VITAMIN B-12 PO) Take 1 tablet by mouth daily.    [provider]  donepezil (ARICEPT) 5 MG tablet Take 5 mg by mouth every evening. 10/19/15   [provider]  EPINEPHrine 0.3 mg/0.3 mL IJ SOAJ injection Inject 0.3 mLs (0.3 mg total) into the muscle once. Patient taking differently: Inject 0.3 mg into the muscle once as needed (allergic reaction).  06/02/15   Karamalegos, Netta Neat, DO  gabapentin (NEURONTIN) 100  MG capsule TAKE 1 CAPSULE BY MOUTH THREE TIMES DAILY Patient taking differently: take 300mg  by mouth at bedtime 10/19/15   Karamalegos, Netta Neat, DO  hydrochlorothiazide (MICROZIDE) 12.5 MG capsule Take 12.5 mg by mouth daily. 01/07/15   [provider]  HYDROcodone-acetaminophen (NORCO) 5-325 MG tablet Take 1-2 tablets by mouth every 6 (six) hours as needed. 07/07/16   Geoffery Lyons, MD  indomethacin (INDOCIN) 25 MG capsule Take 1 capsule (25 mg total) by mouth 3 (three) times daily as needed. 07/07/16   Geoffery Lyons, MD  loperamide (IMODIUM) 2 MG capsule take 1 capsule BY MOUTH AS NEEDED for diarrhea or loose stools 07/20/15   Karamalegos, Alexander J, DO  loratadine (CLARITIN) 10 MG tablet Take 1 tablet (10 mg total) by mouth daily. 10/11/16   Jeffery, Chelle, PA-C  OVER THE COUNTER MEDICATION Place 1 drop into both eyes 2 (two) times daily as needed (dry eyes/burning). Over the counter lubricating eye drops    [provider]  pantoprazole (PROTONIX) 40 MG tablet Take 1 tablet by mouth daily 07/22/15   Althea Charon, Netta Neat, DO  potassium chloride SA (K-DUR,KLOR-CON) 20 MEQ tablet Take 1 tablet (20 mEq total) by mouth 2 (two) times daily. 07/07/16   Geoffery Lyons, MD  simvastatin (ZOCOR) 20 MG tablet Take 1 tablet by mouth daily 10/02/15   Althea Charon, Netta Neat, DO  traMADol Janean Sark) 50 MG tablet Take 1-2 tablets by mouth every 12 hours as needed for pain 10/19/15   Smitty Cords, DO  triamcinolone cream (KENALOG) 0.5 % Apply 1 application topically 3 (three) times daily. 10/11/16   Porfirio Oar, PA-C    Family History Family History  Problem Relation Age of Onset  . Heart disease Mother   . Cancer Sister        breast  . Cancer Brother        skin, kidney, lung    Social History Social History   Tobacco Use  . Smoking status: Never Smoker  . Smokeless tobacco: Never Used  Substance Use Topics  . Alcohol use: No  . Drug use: No     Allergies     Carvedilol; Coreg; Lisinopril; and Sertraline hcl   Review of Systems Review of Systems  Neurological: Positive for weakness.  All other systems reviewed and are negative.    Physical Exam Updated Vital Signs BP (!) 136/56 (BP Location: Right Arm)   Pulse 68   Temp 97.6 F (36.4 C) (Oral)   Resp 20   SpO2 99%   Physical Exam  Constitutional:  Demented, resting tremors   HENT:  Head: Normocephalic.  Mouth/Throat: Oropharynx is clear and moist.  Eyes: Conjunctivae and EOM are normal. Pupils are equal, round, and reactive to light.  Neck: Normal range of motion. Neck supple.  Cardiovascular: Normal rate, regular rhythm and normal  heart sounds.  Pulmonary/Chest: Effort normal and breath sounds normal. No stridor. No respiratory distress. She has no wheezes.  Abdominal: Soft. Bowel sounds are normal.  Musculoskeletal: Normal range of motion.  Neurological: She is alert.  Demented, resting tremors improving with movement. Moving all extremities   Skin: Skin is warm.  Nursing note and vitals reviewed.    ED Treatments / Results  Labs (all labs ordered are listed, but only abnormal results are displayed) Labs Reviewed  CBC WITH DIFFERENTIAL/PLATELET - Abnormal; Notable for the following components:      Result Value   RBC 3.82 (*)    Hemoglobin 11.7 (*)    HCT 34.6 (*)    All other components within normal limits  COMPREHENSIVE METABOLIC PANEL - Abnormal; Notable for the following components:   Glucose, Bld 105 (*)    Creatinine, Ser 1.48 (*)    ALT 12 (*)    GFR calc non Af Amer 32 (*)    GFR calc Af Amer 37 (*)    All other components within normal limits  URINALYSIS, ROUTINE W REFLEX MICROSCOPIC - Abnormal; Notable for the following components:   Leukocytes, UA MODERATE (*)    Squamous Epithelial / LPF 0-5 (*)    All other components within normal limits  URINE CULTURE  LIPASE, BLOOD  I-STAT TROPONIN, ED    EKG  EKG Interpretation None        Radiology Ct Head Wo Contrast  Result Date: 03/14/2017 CLINICAL DATA:  Generalized weakness, altered mental status, dementia. Symptoms for the past couple of days. EXAM: CT HEAD WITHOUT CONTRAST TECHNIQUE: Contiguous axial images were obtained from the base of the skull through the vertex without intravenous contrast. COMPARISON:  MR brain 11/04/2015.  CT head 11/04/2015. FINDINGS: Brain: No evidence for acute infarction, hemorrhage, mass lesion, or extra-axial fluid. Generalized atrophy. Hydrocephalus ex vacuo. Only slight hypoattenuation of the deep white matter, likely chronic microvascular ischemic change. Chronic RIGHT basal ganglia infarct. Vascular: Calcification of the cavernous internal carotid arteries consistent with cerebrovascular atherosclerotic disease. No signs of intracranial large vessel occlusion. Skull: Normal. Negative for fracture or focal lesion. Sinuses/Orbits: No acute finding. Other: None. IMPRESSION: Atrophy and small vessel disease, similar to priors. No acute intracranial findings. Electronically Signed   By: Elsie StainJohn T Curnes M.D.   On: 03/14/2017 13:46   Dg Chest Port 1 View  Result Date: 03/14/2017 CLINICAL DATA:  Altered mental status and generalized weakness EXAM: PORTABLE CHEST 1 VIEW COMPARISON:  11/04/2015 FINDINGS: Normal heart size. Lungs clear. No pneumothorax. No pleural effusion. IMPRESSION: No active disease. Electronically Signed   By: Jolaine ClickArthur  Hoss M.D.   On: 03/14/2017 14:19    Procedures Procedures (including critical care time)  Medications Ordered in ED Medications  cefTRIAXone (ROCEPHIN) 1 g in dextrose 5 % 50 mL IVPB (1 g Intravenous New Bag/Given 03/14/17 1431)  LORazepam (ATIVAN) injection 0.5 mg (0.5 mg Intravenous Given 03/14/17 1432)     Initial Impression / Assessment and Plan / ED Course  I have reviewed the triage vital signs and the nursing notes.  Pertinent labs & imaging results that were available during my care of the patient  were reviewed by me and considered in my medical decision making (see chart for details).     Hardie Loravelyn L Shindler is a 83 y.o. female here with tremors, AMS. Consider worsening dementia vs UTI vs pneumonia vs electrolyte abnormalities. Will get CT head, labs, CXR, UA.   3:22 PM CT head unremarkable. Labs at  baseline. UA ? Small UTI. Given rocephin. Will dc home with keflex. Recommend neuro eval outpatient for workup for dementia.    Final Clinical Impressions(s) / ED Diagnoses   Final diagnoses:  None    ED Discharge Orders    None       Charlynne PanderYao, Donnald Tabar Hsienta, MD 03/14/17 [phone]

## 2017-03-15 LAB — URINE CULTURE: Culture: NO GROWTH

## 2017-03-20 ENCOUNTER — Ambulatory Visit: Payer: Medicare Other | Admitting: Cardiovascular Disease

## 2017-03-28 ENCOUNTER — Encounter: Payer: Self-pay | Admitting: Cardiovascular Disease

## 2017-07-22 IMAGING — CT CT CERVICAL SPINE W/O CM
4 of 7 series · 14 of 33 positions shown, 15 images · non-contrast
Comparison: Head CT September 17, 2015

CLINICAL DATA: Syncope with fall

EXAM:
CT HEAD WITHOUT CONTRAST
CT CERVICAL SPINE WITHOUT CONTRAST
TECHNIQUE: Multidetector CT imaging of the head and cervical spine was
performed following the standard protocol without intravenous
contrast. Multiplanar CT image reconstructions of the cervical spine
were also generated.

[Series 4: head bone · axial · 0.44mm/px · z∈[+1416,+1488]mm · 3 of 73 slices shown]
[im 19/73  bone]
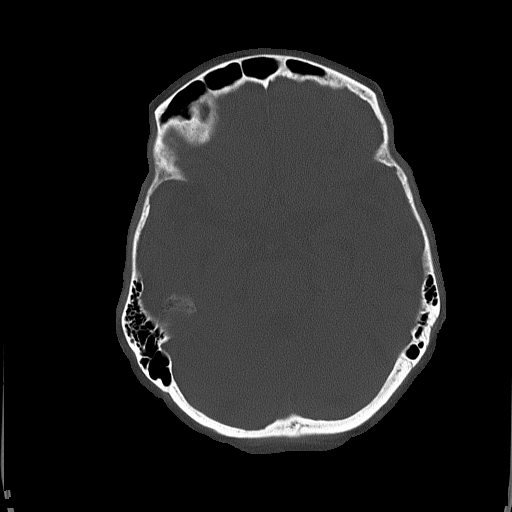
[im 37/73  bone]
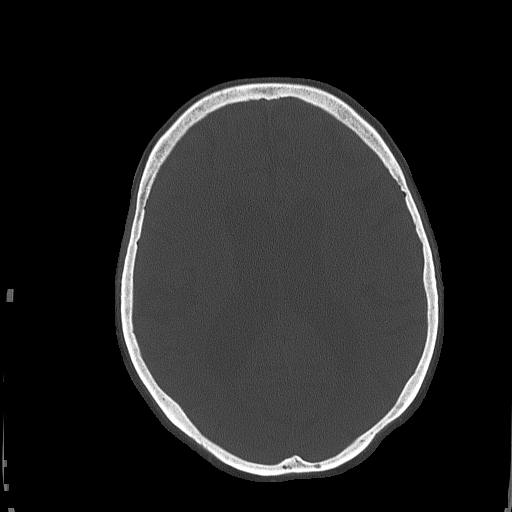
[im 55/73  bone]
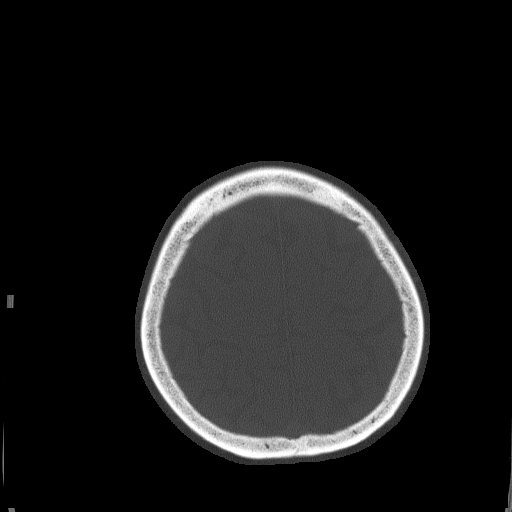

[Series 5: head without cor · coronal · non-contrast · 0.31mm/px · 3 of 63 slices shown]
[im 16/63  bone]
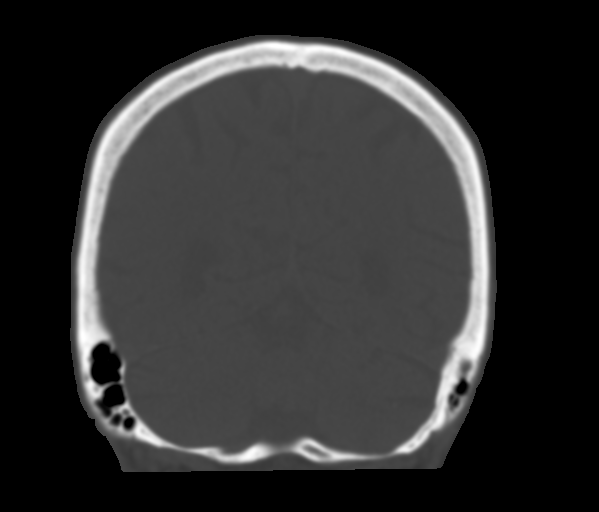
[im 32/63  bone]
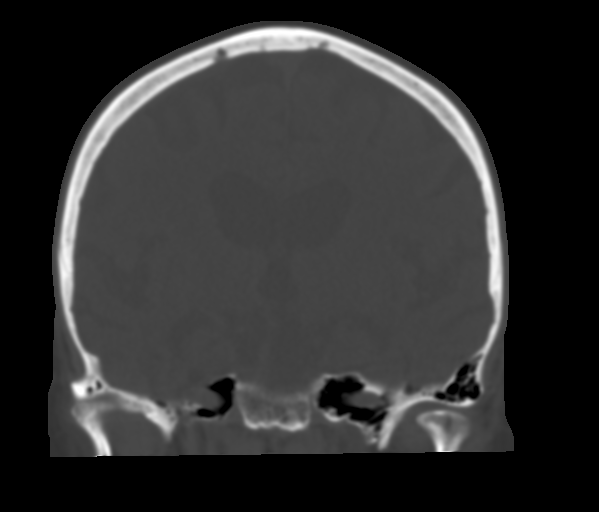
[im 47/63  bone]
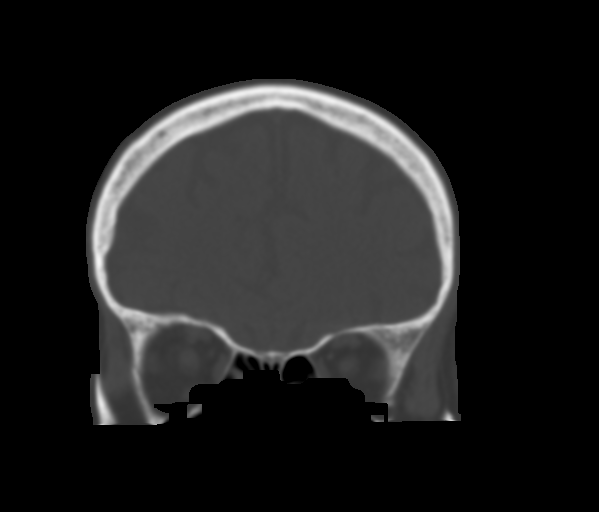

[Series 7: c_spine 2.0 st · axial · 0.32mm/px · z∈[+1244,+1346]mm · 4 of 87 slices shown, 5 images]
[im 18/87  soft-tissue]
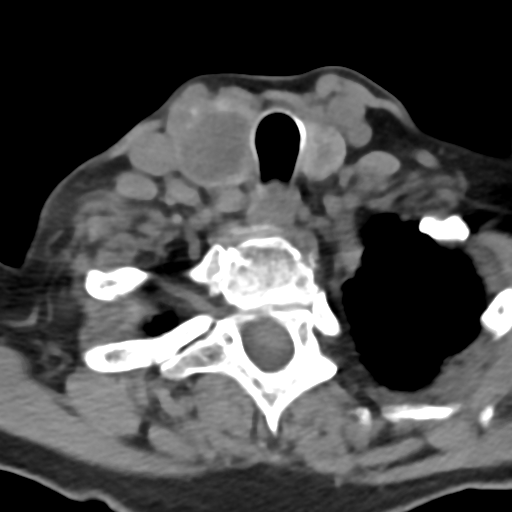
[im 18/87  bone]
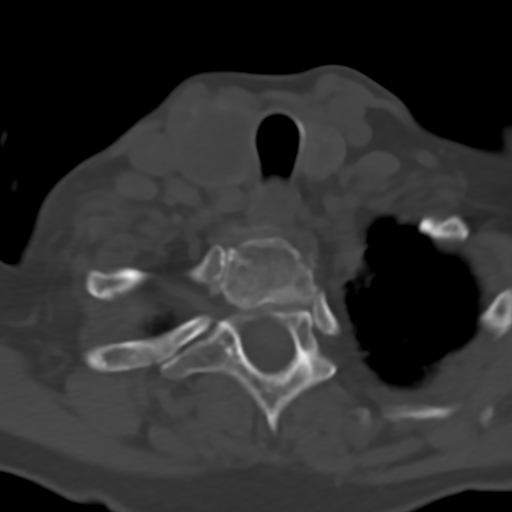
[im 35/87  bone]
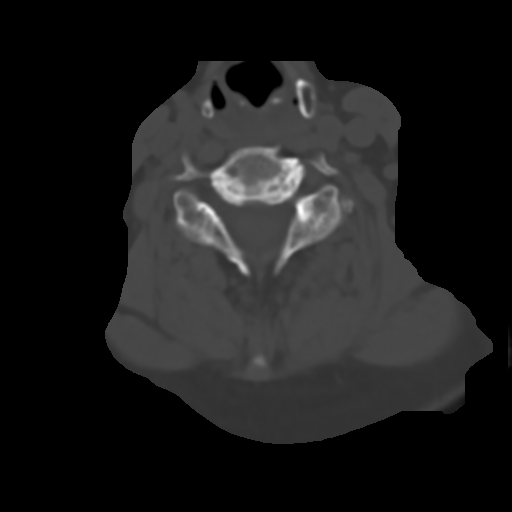
[im 52/87  bone]
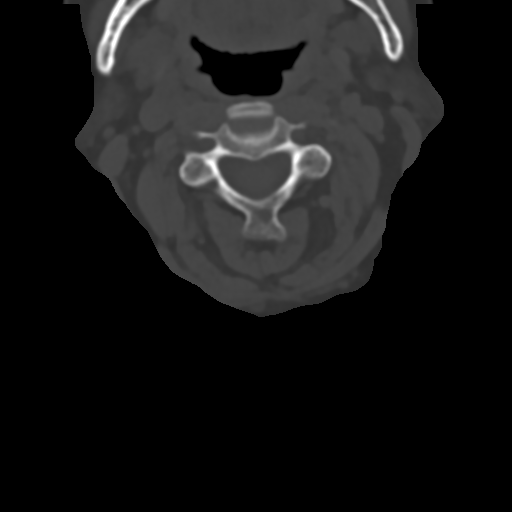
[im 69/87  bone]
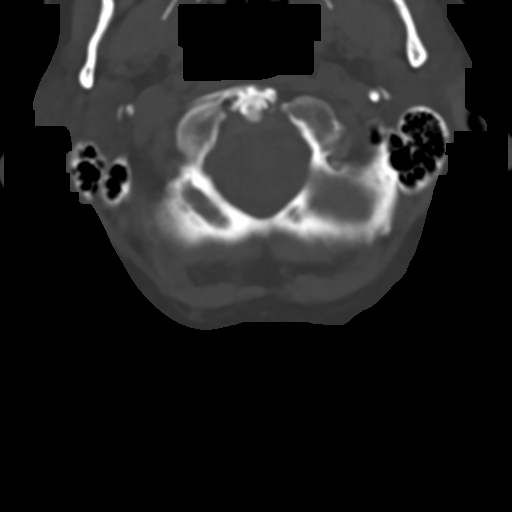

[Series 9: c_spine 2.0 sag bone · sagittal · 0.38mm/px · 4 of 46 slices shown]
[im 10/46  bone]
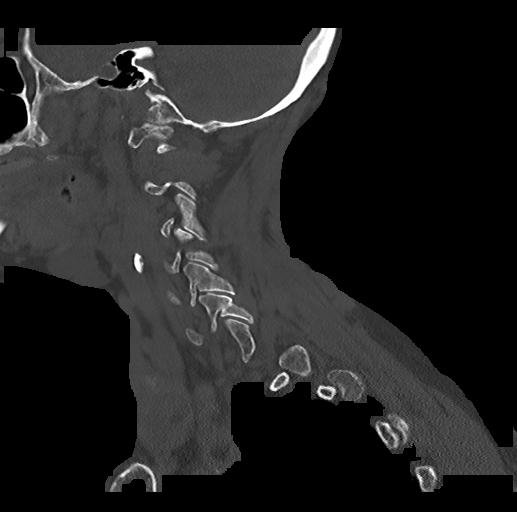
[im 19/46  bone]
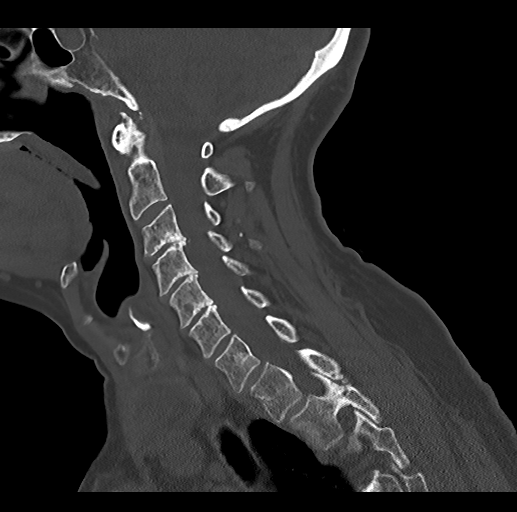
[im 28/46  bone]
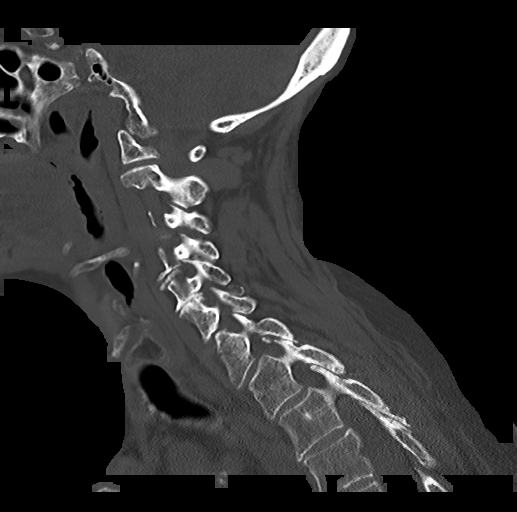
[im 37/46  bone]
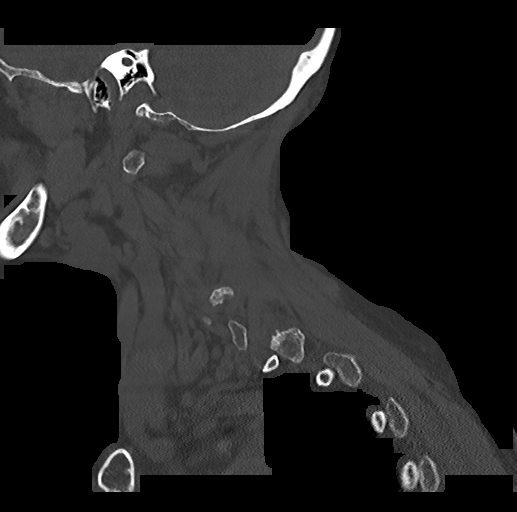

[14 of 33 positions shown; findings below may reference images not displayed]

FINDINGS: CT HEAD FINDINGS

Mild diffuse atrophy is stable. There is no intracranial mass,
hemorrhage, extra-axial fluid collection, or midline shift. There is
mild small vessel disease in the centra semiovale bilaterally.
Elsewhere gray-white compartments appear normal. No acute infarct
evident. There is calcification in both cavernous carotid artery
regions. The bony calvarium appears intact. The mastoid air cells
are clear. There is opacification in portions of the sphenoid
sinuses bilaterally as well as in several ethmoid air cells. No
intraorbital lesions are appreciable.

CT CERVICAL SPINE FINDINGS

There is no fracture or spondylolisthesis. Prevertebral soft tissues
and predental space regions are normal. There is moderately severe
disc space narrowing at C4-5 and C5-6. There is slightly milder
narrowing at C3-4. There is facet hypertrophy at multiple levels
bilaterally. There is exit foraminal narrowing due to bony
hypertrophy on the left at C3-4, on the left at C4-5, and on the
left at C5-6.

There is a dominant mass arising from the right lobe of the thyroid
measuring 2.6 x 2.3 cm. This mass is of low attenuation and may
represent a large colloid cyst. There is inhomogeneity in the
attenuation of the left lobe of the thyroid. There are foci of
atherosclerotic calcification in the carotid arteries bilaterally.
IMPRESSION: CT head: Mild diffuse atrophy with mild periventricular small vessel
disease. No intracranial mass, hemorrhage, or extra-axial fluid
collection. No acute infarct. Areas of paranasal sinus disease.
Cavernous carotid artery calcification bilaterally.

CT cervical spine: No demonstrable fracture or spondylolisthesis.
Multilevel arthropathy.

There is bilateral carotid artery calcification.

Dominant mass right thyroid, likely a colloid cyst. Given the size
of this thyroid mass, correlation with ultrasound advised. There is
also inhomogeneity in the left lobe of the thyroid.

## 2017-08-01 ENCOUNTER — Encounter: Payer: Self-pay | Admitting: Physician Assistant

## 2020-04-01 DEATH — deceased
